# Patient Record
Sex: Female | Born: 1960 | Race: Black or African American | Hispanic: No | Marital: Married | Smoking: Former smoker
Health system: Southern US, Community
[De-identification: ages and names within clinical notes are randomized; demographics above are authoritative.]

## PROBLEM LIST (undated history)

## (undated) DIAGNOSIS — M1990 Unspecified osteoarthritis, unspecified site: Secondary | ICD-10-CM

## (undated) DIAGNOSIS — F419 Anxiety disorder, unspecified: Secondary | ICD-10-CM

## (undated) DIAGNOSIS — Z860100 Personal history of colon polyps, unspecified: Secondary | ICD-10-CM

## (undated) DIAGNOSIS — K5792 Diverticulitis of intestine, part unspecified, without perforation or abscess without bleeding: Secondary | ICD-10-CM

## (undated) DIAGNOSIS — F329 Major depressive disorder, single episode, unspecified: Secondary | ICD-10-CM

## (undated) DIAGNOSIS — F32A Depression, unspecified: Secondary | ICD-10-CM

## (undated) DIAGNOSIS — D689 Coagulation defect, unspecified: Secondary | ICD-10-CM

## (undated) DIAGNOSIS — E119 Type 2 diabetes mellitus without complications: Secondary | ICD-10-CM

## (undated) DIAGNOSIS — H269 Unspecified cataract: Secondary | ICD-10-CM

## (undated) DIAGNOSIS — K219 Gastro-esophageal reflux disease without esophagitis: Secondary | ICD-10-CM

## (undated) DIAGNOSIS — IMO0002 Reserved for concepts with insufficient information to code with codable children: Secondary | ICD-10-CM

## (undated) DIAGNOSIS — I10 Essential (primary) hypertension: Secondary | ICD-10-CM

## (undated) DIAGNOSIS — T7840XA Allergy, unspecified, initial encounter: Secondary | ICD-10-CM

## (undated) DIAGNOSIS — E785 Hyperlipidemia, unspecified: Secondary | ICD-10-CM

## (undated) DIAGNOSIS — B019 Varicella without complication: Secondary | ICD-10-CM

## (undated) HISTORY — DX: Unspecified osteoarthritis, unspecified site: M19.90

## (undated) HISTORY — DX: Diverticulitis of intestine, part unspecified, without perforation or abscess without bleeding: K57.92

## (undated) HISTORY — DX: Reserved for concepts with insufficient information to code with codable children: IMO0002

## (undated) HISTORY — PX: JOINT REPLACEMENT: SHX530

## (undated) HISTORY — DX: Coagulation defect, unspecified: D68.9

## (undated) HISTORY — DX: Personal history of colon polyps, unspecified: Z86.0100

## (undated) HISTORY — DX: Major depressive disorder, single episode, unspecified: F32.9

## (undated) HISTORY — PX: TONSILLECTOMY: SUR1361

## (undated) HISTORY — PX: BREAST EXCISIONAL BIOPSY: SUR124

## (undated) HISTORY — DX: Essential (primary) hypertension: I10

## (undated) HISTORY — DX: Hyperlipidemia, unspecified: E78.5

## (undated) HISTORY — DX: Anxiety disorder, unspecified: F41.9

## (undated) HISTORY — DX: Gastro-esophageal reflux disease without esophagitis: K21.9

## (undated) HISTORY — DX: Personal history of colonic polyps: Z86.010

## (undated) HISTORY — DX: Depression, unspecified: F32.A

## (undated) HISTORY — DX: Varicella without complication: B01.9

## (undated) HISTORY — DX: Type 2 diabetes mellitus without complications: E11.9

## (undated) HISTORY — PX: CHOLECYSTECTOMY: SHX55

## (undated) HISTORY — DX: Unspecified cataract: H26.9

## (undated) HISTORY — PX: BREAST SURGERY: SHX581

## (undated) HISTORY — PX: TOTAL HIP ARTHROPLASTY: SHX124

## (undated) HISTORY — DX: Allergy, unspecified, initial encounter: T78.40XA

---

## 1988-05-09 HISTORY — PX: BREAST BIOPSY: SHX20

## 1988-05-09 HISTORY — PX: BREAST SURGERY: SHX581

## 2002-12-25 ENCOUNTER — Observation Stay (HOSPITAL_COMMUNITY): Admission: RE | Admit: 2002-12-25 | Discharge: 2002-12-26 | Payer: Self-pay | Admitting: *Deleted

## 2006-02-22 ENCOUNTER — Ambulatory Visit: Payer: Self-pay | Admitting: Family Medicine

## 2007-05-10 HISTORY — PX: TOTAL ABDOMINAL HYSTERECTOMY: SHX209

## 2007-05-10 HISTORY — PX: ABDOMINAL HYSTERECTOMY: SHX81

## 2007-11-28 ENCOUNTER — Ambulatory Visit: Payer: Self-pay | Admitting: Gastroenterology

## 2008-02-14 ENCOUNTER — Ambulatory Visit: Payer: Self-pay | Admitting: Family Medicine

## 2008-05-09 HISTORY — PX: NASAL SEPTUM SURGERY: SHX37

## 2008-05-09 HISTORY — PX: KNEE ARTHROSCOPY: SUR90

## 2009-04-23 ENCOUNTER — Ambulatory Visit: Payer: Self-pay | Admitting: Otolaryngology

## 2009-12-17 ENCOUNTER — Ambulatory Visit: Payer: Self-pay | Admitting: Internal Medicine

## 2009-12-18 ENCOUNTER — Ambulatory Visit: Payer: Self-pay | Admitting: Internal Medicine

## 2010-01-07 ENCOUNTER — Ambulatory Visit: Payer: Self-pay | Admitting: Surgery

## 2010-01-14 ENCOUNTER — Ambulatory Visit: Payer: Self-pay | Admitting: Surgery

## 2010-01-15 LAB — PATHOLOGY REPORT

## 2010-01-22 ENCOUNTER — Ambulatory Visit: Payer: Self-pay | Admitting: Family Medicine

## 2012-05-25 ENCOUNTER — Emergency Department: Payer: Self-pay | Admitting: Emergency Medicine

## 2012-05-25 LAB — CBC
HCT: 36.6 % (ref 35.0–47.0)
HGB: 12.4 g/dL (ref 12.0–16.0)
MCH: 30.7 pg (ref 26.0–34.0)
MCV: 90 fL (ref 80–100)
Platelet: 459 10*3/uL — ABNORMAL HIGH (ref 150–440)
RDW: 13.9 % (ref 11.5–14.5)
WBC: 10.4 10*3/uL (ref 3.6–11.0)

## 2012-05-25 LAB — COMPREHENSIVE METABOLIC PANEL
Alkaline Phosphatase: 74 U/L (ref 50–136)
BUN: 11 mg/dL (ref 7–18)
Bilirubin,Total: 0.1 mg/dL — ABNORMAL LOW (ref 0.2–1.0)
Calcium, Total: 8.6 mg/dL (ref 8.5–10.1)
Chloride: 105 mmol/L (ref 98–107)
Co2: 25 mmol/L (ref 21–32)
EGFR (African American): >60
EGFR (Non-African Amer.): >60
Glucose: 99 mg/dL (ref 65–99)
SGPT (ALT): 29 U/L (ref 12–78)
Sodium: 138 mmol/L (ref 136–145)

## 2012-05-26 LAB — TSH: Thyroid Stimulating Horm: 3.04 u[IU]/mL

## 2012-06-15 ENCOUNTER — Ambulatory Visit: Payer: Self-pay | Admitting: Internal Medicine

## 2013-01-22 ENCOUNTER — Emergency Department: Payer: Self-pay | Admitting: Emergency Medicine

## 2013-01-22 LAB — LIPASE, BLOOD: Lipase: 96 U/L (ref 73–393)

## 2013-01-22 LAB — COMPREHENSIVE METABOLIC PANEL
Albumin: 3.6 g/dL (ref 3.4–5.0)
Alkaline Phosphatase: 57 U/L (ref 50–136)
Anion Gap: 5 — ABNORMAL LOW (ref 7–16)
Bilirubin,Total: 0.2 mg/dL (ref 0.2–1.0)
Calcium, Total: 8.2 mg/dL — ABNORMAL LOW (ref 8.5–10.1)
Chloride: 104 mmol/L (ref 98–107)
Co2: 25 mmol/L (ref 21–32)
Creatinine: 0.86 mg/dL (ref 0.60–1.30)
EGFR (African American): >60
SGPT (ALT): 28 U/L (ref 12–78)
Sodium: 134 mmol/L — ABNORMAL LOW (ref 136–145)
Total Protein: 7.4 g/dL (ref 6.4–8.2)

## 2013-01-22 LAB — CBC
HCT: 35.8 % (ref 35.0–47.0)
MCH: 30.8 pg (ref 26.0–34.0)
RDW: 15 % — ABNORMAL HIGH (ref 11.5–14.5)

## 2013-01-23 LAB — URINALYSIS, COMPLETE
Bilirubin,UR: NEGATIVE
Ph: 6 (ref 4.5–8.0)
Protein: NEGATIVE
RBC,UR: <1 /HPF (ref 0–5)
Specific Gravity: 1.025 (ref 1.003–1.030)
Squamous Epithelial: 5
WBC UR: 3 /HPF (ref 0–5)

## 2013-03-19 ENCOUNTER — Ambulatory Visit: Payer: Self-pay

## 2013-03-22 ENCOUNTER — Ambulatory Visit: Payer: Self-pay | Admitting: Gastroenterology

## 2013-04-11 ENCOUNTER — Ambulatory Visit: Payer: Self-pay | Admitting: Family Medicine

## 2013-04-19 ENCOUNTER — Ambulatory Visit: Payer: Self-pay | Admitting: Gastroenterology

## 2013-06-01 ENCOUNTER — Ambulatory Visit: Payer: Self-pay | Admitting: Orthopedic Surgery

## 2013-06-09 LAB — HM DIABETES EYE EXAM

## 2013-06-11 LAB — HM DIABETES FOOT EXAM

## 2014-03-11 ENCOUNTER — Encounter (INDEPENDENT_AMBULATORY_CARE_PROVIDER_SITE_OTHER): Payer: Self-pay

## 2014-03-11 ENCOUNTER — Encounter: Payer: Self-pay | Admitting: Internal Medicine

## 2014-03-11 ENCOUNTER — Ambulatory Visit (INDEPENDENT_AMBULATORY_CARE_PROVIDER_SITE_OTHER): Payer: Commercial Managed Care - PPO | Admitting: Internal Medicine

## 2014-03-11 VITALS — BP 118/80 | HR 57 | Temp 98.2°F | Ht 65.75 in | Wt 200.0 lb

## 2014-03-11 DIAGNOSIS — E669 Obesity, unspecified: Secondary | ICD-10-CM

## 2014-03-11 DIAGNOSIS — E1169 Type 2 diabetes mellitus with other specified complication: Secondary | ICD-10-CM | POA: Insufficient documentation

## 2014-03-11 DIAGNOSIS — M1990 Unspecified osteoarthritis, unspecified site: Secondary | ICD-10-CM | POA: Insufficient documentation

## 2014-03-11 DIAGNOSIS — E785 Hyperlipidemia, unspecified: Secondary | ICD-10-CM

## 2014-03-11 DIAGNOSIS — E1159 Type 2 diabetes mellitus with other circulatory complications: Secondary | ICD-10-CM | POA: Insufficient documentation

## 2014-03-11 DIAGNOSIS — K219 Gastro-esophageal reflux disease without esophagitis: Secondary | ICD-10-CM

## 2014-03-11 DIAGNOSIS — E119 Type 2 diabetes mellitus without complications: Secondary | ICD-10-CM

## 2014-03-11 DIAGNOSIS — I152 Hypertension secondary to endocrine disorders: Secondary | ICD-10-CM | POA: Insufficient documentation

## 2014-03-11 DIAGNOSIS — I10 Essential (primary) hypertension: Secondary | ICD-10-CM

## 2014-03-11 DIAGNOSIS — Z23 Encounter for immunization: Secondary | ICD-10-CM

## 2014-03-11 NOTE — Addendum Note (Signed)
Addended by: Roena MaladyEVONTENNO, Amariss Detamore Y on: 03/11/2014 02:05 PM   Modules accepted: Orders

## 2014-03-11 NOTE — Assessment & Plan Note (Signed)
Recently had labs at prior PCP- will request those records Eye exam UTD Foot exam UTD Will check microalbumin and A1C at next visit Flu shot UTD Will discuss Pneumonia vaccine at next visit

## 2014-03-11 NOTE — Progress Notes (Signed)
HPI  Pt presents to the clinic today to establish care. She is transferring care from Dr. Thedore MinsSingh at [facility]. She is scheduled to have a tonsillectomy later this month secondary to recurrent tonsillitis.  Flu: 02/2014 Tetanus: 2005? LMP: Hysterectomy Pap Smear: 2014 Mammogram: 04/2013 Colon Screening: 2014- [facility] (polyps, otherwise normal) Vision Screenig: 01/2014 Dentist: biannually  Arthritis: Occasional. Seems to be most severe in her neck. Worse with cold weather. Relieved with occasional tylenol.  Depression: Remote history of. No reoccurrence. Not medicated.  DM 2: Sugars range from 90-110 fasting. Her last A1C was 6.4%, 10/15. Tries to comply with diabetic diet. Lost 15 lbs over the last 3 months through exercise. Has had diabetic eye exam- no retinopathy. Had foot exam 10/15 from last PCP.  GERD: On protonix daily. Has not had breakthrough issues. Recent UGI- no evidence of Barrett's.  HTN: Well controlled on Lisinopril-HCTZ.  HLD: Takes fish oil daily, otherwise diet controlled.  Past Medical History  Diagnosis Date  . Arthritis   . Chicken pox   . Depression   . Diabetes mellitus without complication   . Diverticulitis   . GERD (gastroesophageal reflux disease)   . Allergy   . Hypertension   . Hyperlipidemia   . History of colon polyps     Current Outpatient Prescriptions  Medication Sig Dispense Refill  . beclomethasone (QVAR) 80 MCG/ACT inhaler Place 1 each into the nose daily.    Marland Kitchen. estrogens, conjugated, (PREMARIN) 0.3 MG tablet Take 1 tablet by mouth daily.    Marland Kitchen. lisinopril-hydrochlorothiazide (PRINZIDE,ZESTORETIC) 20-25 MG per tablet Take 1 tablet by mouth daily.    . metFORMIN (GLUCOPHAGE) 500 MG tablet Take 1 tablet by mouth daily.    . Omega-3 Fatty Acids (FISH OIL) 300 MG CAPS Take 1 capsule by mouth 2 (two) times daily.    . pantoprazole (PROTONIX) 40 MG tablet Take 1 tablet by mouth daily.     No current facility-administered medications  for this visit.    Allergies  Allergen Reactions  . Doxycycline Diarrhea and Nausea And Vomiting  . Nsaids Other (See Comments)    Bleeding ulcer    Family History  Problem Relation Age of Onset  . Alcohol abuse Mother   . Hyperlipidemia Mother   . Heart disease Mother   . Hypertension Mother   . Alcohol abuse Father   . Hyperlipidemia Father   . Heart disease Father   . Hypertension Father   . Alcohol abuse Maternal Aunt   . Hypertension Maternal Aunt   . Kidney disease Maternal Aunt   . Alcohol abuse Maternal Uncle   . Hypertension Maternal Uncle   . Alcohol abuse Paternal Aunt   . Birth defects Paternal Aunt     Colon, Breast, Lung  . Heart disease Paternal Aunt   . Alcohol abuse Paternal Uncle   . Heart disease Paternal Uncle   . Stroke Maternal Grandmother   . Hypertension Maternal Grandmother   . Alcohol abuse Maternal Grandfather   . Heart disease Maternal Grandfather   . Hypertension Maternal Grandfather     History   Social History  . Marital Status: Married    Spouse Name: N/A    Number of Children: N/A  . Years of Education: N/A   Occupational History  . Not on file.   Social History Main Topics  . Smoking status: Former Games developermoker  . Smokeless tobacco: Never Used     Comment: quit 2007  . Alcohol Use: No  .  Drug Use: Not on file  . Sexual Activity: Not on file   Other Topics Concern  . Not on file   Social History Narrative  . No narrative on file    ROS:  Constitutional: Denies fever, malaise, fatigue, headache or abrupt weight changes.  Respiratory: Denies difficulty breathing, shortness of breath, cough or sputum production.   Cardiovascular: Denies chest pain, chest tightness, palpitations or swelling in the hands or feet.  Gastrointestinal: Denies abdominal pain, bloating, constipation, diarrhea or blood in the stool.  Musculoskeletal: Pt reports neck pain. Denies decrease in range of motion, difficulty with gait, muscle pain or joint   swelling.  Skin: Denies redness, rashes, lesions or ulcercations.  Neurological: Denies dizziness, difficulty with memory, difficulty with speech or problems with balance and coordination.   No other specific complaints in a complete review of systems (except as listed in HPI above).  PE:  BP 118/80 mmHg  Pulse 57  Temp(Src) 98.2 F (36.8 C) (Oral)  Ht 5' 5.75" (1.67 m)  Wt 200 lb (90.719 kg)  BMI 32.53 kg/m2  SpO2 98%  LMP  (LMP Unknown) Wt Readings from Last 3 Encounters:  03/11/14 200 lb (90.719 kg)    General: Appears her stated age, well developed, well nourished in NAD. Cardiovascular: Normal rate and rhythm. S1,S2 noted.  No murmur, rubs or gallops noted. No JVD or BLE edema. No carotid bruits noted. Pulmonary/Chest: Normal effort and positive vesicular breath sounds. No respiratory distress. No wheezes, rales or ronchi noted.  Abdomen: Soft and nontender. Normal bowel sounds, no bruits noted. No distention or masses noted. Liver, spleen and kidneys non palpable. Musculoskeletal: Normal flexion, extension and lateral rotations of the neck. Some tenderness noted with palpation of the cervical spine.  Neurological: Alert and oriented.  Psychiatric: Mood and affect normal. Behavior is normal. Judgment and thought content normal.     Assessment and Plan:  Health Maintenance:  Tdap today  RTC in 6 months for follow up of chronic conditions

## 2014-03-11 NOTE — Progress Notes (Signed)
Pre visit review using our clinic review tool, if applicable. No additional management support is needed unless otherwise documented below in the visit note. 

## 2014-03-11 NOTE — Assessment & Plan Note (Signed)
On fish oil but mainly diet controlled Will request recent labs from previous PCP Will repeat lipid profile at next visit

## 2014-03-11 NOTE — Assessment & Plan Note (Signed)
Has been working on weight loss with diet and exercise Congratulated her on loss on 15 lbs Encourage her to continue to work on her diet and exercise

## 2014-03-11 NOTE — Assessment & Plan Note (Signed)
Well controlled on lisinopril HCT Will request recent labs from prior PCP Will check CBC and CMET at next visit

## 2014-03-11 NOTE — Assessment & Plan Note (Signed)
Controlled on protonix 40 mg daily Will discuss cutting back on this at next visit

## 2014-03-11 NOTE — Assessment & Plan Note (Signed)
Mainly in her neck Relieved by occasional tylenol

## 2014-03-11 NOTE — Patient Instructions (Signed)
Tonsillectomy  A tonsillectomy is a surgery to remove your tonsils. Tonsils are lymph tissues at the back and upper part of your throat. Because tonsils can collect debris, they can become infected. Tonsillectomy often is done when nonsurgical treatments have been unsuccessful in resolving problems with tonsils.   LET YOUR HEALTH CARE PROVIDER KNOW ABOUT:  · Any allergies you have.  · All medicines you are taking, including vitamins, herbs, eye drops, creams, and over-the-counter medicines, especially those containing aspirin or ibuprofen.  · Previous problems you or members of your family have had with the use of anesthetics.  · Any blood disorders you have.  · Previous surgeries you have had.  · Medical conditions you have.  · Recent cough or fever you have had.  RISKS AND COMPLICATIONS  Generally, tonsillectomy is a safe procedure. However, as with any procedure, problems can occur. Possible problems include:  · Bleeding.  · Infection.  · Scarring.  · Changes in your sense of taste.  · Changes in your voice.  · Changes in swallowing.  BEFORE THE PROCEDURE  · Do not take any aspirin, ibuprofen, or any medicines that may contain these agents for 2 weeks before the procedure.  · Do not eat or drink after midnight the night before the procedure.  PROCEDURE   · You will be given a medicine that makes you go to sleep (general anesthetic).  · A device will be placed inside of your mouth that presses your tongue down so that the tonsils at the back of your throat can be removed without cuts on the outside of your neck or throat.  · Your tonsils will typically be removed with a device called an electrocautery, which will cut your tonsils out and then shrink the surrounding blood vessels at the same time so that you will not bleed after the procedure.  · Usually, stitches will not be used to close the cut. A white scab (eschar) will form in the area where your tonsils used to be.  AFTER THE PROCEDURE  After surgery, you  will be taken to a recovery area for close monitoring. Once you are awake, stable, and taking fluids well, you will be allowed to go home.   Document Released: 08/07/2000 Document Revised: 04/30/2013 Document Reviewed: 11/20/2012  ExitCare® Patient Information ©2015 [employer]. This information is not intended to replace advice given to you by your health care provider. Make sure you discuss any questions you have with your health care provider.

## 2014-03-12 ENCOUNTER — Telehealth: Payer: Self-pay | Admitting: Internal Medicine

## 2014-03-12 NOTE — Telephone Encounter (Signed)
emmi emailed °

## 2014-04-02 ENCOUNTER — Telehealth: Payer: Self-pay

## 2014-04-02 NOTE — Telephone Encounter (Signed)
Pt left v/m requesting different code than obesity attached to 03/11/14 because pts ins will not cover obesity. Pt request cb.

## 2014-04-08 NOTE — Telephone Encounter (Signed)
Pt called in ref to DOS 03/11/2014 b/c she has [facility] stating the claim was denied. She called [employer] and they told her it's b/c obesity was listed as the primary code.  I also noticed the [facility] w[facility]a NP as the billing provider but it should have had an MD Ruthe Mannan(Talia Aron). I spoke w/Nicole at billing (direct # [phone]) and she says they can't do anything w/the claim until the denial from [facility] is rec'd. She is going to "flag" the account so that once the denial is rec'd, the claim will be sent to coding review. I notified patient.

## 2014-05-22 ENCOUNTER — Telehealth: Payer: Self-pay

## 2014-05-22 NOTE — Telephone Encounter (Signed)
Pt left v/m; pt having diarrhea with Glucophage and pt wants to know if could stop Glucophage and start Victoza. [facility]. Pt request cb. Pt last seen 03/11/14 to establish care.

## 2014-05-22 NOTE — Telephone Encounter (Signed)
We can stop glucophage and start glipizide. Victoza is not used alone in the treatment of diabetes.

## 2014-05-23 ENCOUNTER — Other Ambulatory Visit: Payer: Self-pay | Admitting: Internal Medicine

## 2014-05-23 MED ORDER — GLIPIZIDE 5 MG PO TABS: 5.0000 mg | ORAL_TABLET | Freq: Two times a day (BID) | ORAL | Status: DC

## 2014-05-23 NOTE — Telephone Encounter (Signed)
RX sent to pharmacy  

## 2014-05-23 NOTE — Telephone Encounter (Signed)
Left detailed msg on VM per HIPAA Please send glipizide to pharmacy

## 2014-06-05 ENCOUNTER — Encounter: Payer: Self-pay | Admitting: Internal Medicine

## 2014-06-05 ENCOUNTER — Ambulatory Visit (INDEPENDENT_AMBULATORY_CARE_PROVIDER_SITE_OTHER): Payer: Commercial Managed Care - PPO | Admitting: Internal Medicine

## 2014-06-05 VITALS — BP 118/70 | HR 62 | Temp 97.9°F | Wt 205.0 lb

## 2014-06-05 DIAGNOSIS — J3489 Other specified disorders of nose and nasal sinuses: Secondary | ICD-10-CM

## 2014-06-05 DIAGNOSIS — R040 Epistaxis: Secondary | ICD-10-CM

## 2014-06-05 DIAGNOSIS — R220 Localized swelling, mass and lump, head: Secondary | ICD-10-CM

## 2014-06-05 MED ORDER — METHYLPREDNISOLONE ACETATE 80 MG/ML IJ SUSP
80.0000 mg | Freq: Once | INTRAMUSCULAR | Status: AC
  Administered 2014-06-05: 80 mg via INTRAMUSCULAR

## 2014-06-05 NOTE — Progress Notes (Signed)
HPI  Hannah Calhoun is a 54 y.o. female presenting to clinic with swollen eyes x 2 days and bloody nose with blood-tinged nasal mucous x 3 weeks. Her left eye was swollen yesterday and she awoke w/ both swollen this morning. Eyelids sting and feel itchy but no pain. Has been using new body wash on face. No throat tightness, SOB, visual disturbances, eye drainage, eye irritation or pain. No dizziness, headache, or changes in mental status. Takes Allegra every day (started a month ago) but hasn't helped much; weekly allergy injections - [facility] and allergy - last one on Tuesday. Intermittently seeing clots of blood while blowing nose - thinks may be due to frequent QNASL use for chronic PND. Tried neosporin w/ no improvement. Last nose bleed was yesterday. No accident/facial injury. No prior hx of nose bleeds.   PSH Tonsillectomy in Nov.   Review of Systems  Past Medical History  Diagnosis Date  . Arthritis   . Chicken pox   . Depression   . Diabetes mellitus without complication   . Diverticulitis   . GERD (gastroesophageal reflux disease)   . Allergy   . Hypertension   . Hyperlipidemia   . History of colon polyps     Family History  Problem Relation Age of Onset  . Alcohol abuse Mother   . Hyperlipidemia Mother   . Heart disease Mother   . Hypertension Mother   . Alcohol abuse Father   . Hyperlipidemia Father   . Heart disease Father   . Hypertension Father   . Alcohol abuse Maternal Aunt   . Hypertension Maternal Aunt   . Kidney disease Maternal Aunt   . Alcohol abuse Maternal Uncle   . Hypertension Maternal Uncle   . Alcohol abuse Paternal Aunt   . Birth defects Paternal Aunt     Colon, Breast, Lung  . Heart disease Paternal Aunt   . Alcohol abuse Paternal Uncle   . Heart disease Paternal Uncle   . Stroke Maternal Grandmother   . Hypertension Maternal Grandmother   . Alcohol abuse Maternal Grandfather   . Heart disease Maternal Grandfather   . Hypertension Maternal  Grandfather     History   Social History  . Marital Status: Married    Spouse Name: N/A    Number of Children: N/A  . Years of Education: N/A   Occupational History  . Not on file.   Social History Main Topics  . Smoking status: Former Games developermoker  . Smokeless tobacco: Never Used     Comment: quit 2007  . Alcohol Use: No  . Drug Use: No  . Sexual Activity: Yes   Other Topics Concern  . Not on file   Social History Narrative    Allergies  Allergen Reactions  . Doxycycline Diarrhea and Nausea And Vomiting  . Nsaids Other (See Comments)    Bleeding ulcer    Constitutional: Denies throat tightness,headache, dizziness, fatigue, fever, and abrupt weight changes.  Skin: Pt reports swelling of the eyelids. Denies ulcerations or lesions. HEENT:  Positive nose bleeds, runny nose, nasal congestion, PND and ear fullness. Denies eye redness, eye pain, pressure behind the eyes, facial pain, ear pain, ringing in the ears or wax buildup. Respiratory: Denies cough, difficulty breathing or shortness of breath.  Cardiovascular: Denies chest pain, chest tightness, palpitations or swelling in the hands or feet.   No other specific complaints in a complete review of systems (except as listed in HPI above).  Objective:   BP  118/70 mmHg  Pulse 62  Temp(Src) 97.9 F (36.6 C) (Oral)  Wt 205 lb (92.987 kg)  SpO2 98%  LMP  (LMP Unknown) Wt Readings from Last 3 Encounters:  06/05/14 205 lb (92.987 kg)  03/11/14 200 lb (90.719 kg)   General: Appears with considerable upper and lower eyelid swelling but in NAD. Skin: Erythematous & swollen upper & lower lids b/l; no rashes. HEENT: Head: normal shape and size; Eyes: sclera white, no icterus, conjunctiva pink; Ears: Tm's gray and intact, normal light reflex; Nose: mucosa erythematous and moist, septum midline; Throat/Mouth: + PND. Teeth present, mucosa erythematous and moist, no exudate noted, no lesions or ulcerations noted.  Neck: No  adenopathy noted. Cardiovascular: Normal rate and rhythm. S1,S2 noted.  No murmur, rubs or gallops noted.  Pulmonary/Chest: Normal effort and positive vesicular breath sounds. No respiratory distress. No wheezes, rales or ronchi noted. Neuro: Alert & oriented x 3. CN 2-12 intact. Sensory and motor intact b/l upper and lower.     Assessment & Plan:   Swelling around eyes:  Depo-medrol injection 80 mg Stop new body wash use.  Try facial wash for sensitive skin.  Nose bleeds due to dry nasal passage:  Stop QNA SL use until nose bleeds resolve - up to a week. Use OTC nasal saline Continue Allegra  RTC as needed or if symptoms persist.

## 2014-06-05 NOTE — Patient Instructions (Signed)
Angioedema °Angioedema is a sudden swelling of tissues, often of the skin. It can occur on the face or genitals or in the abdomen or other body parts. The swelling usually develops over a short period and gets better in 24 to 48 hours. It often begins during the night and is found when the person wakes up. The person may also get red, itchy patches of skin (hives). Angioedema can be dangerous if it involves swelling of the air passages.  °Depending on the cause, episodes of angioedema may only happen once, come back in unpredictable patterns, or repeat for several years and then gradually fade away.  °CAUSES  °Angioedema can be caused by an allergic reaction to various triggers. It can also result from nonallergic causes, including reactions to drugs, immune system disorders, viral infections, or an abnormal gene that is passed to you from your parents (hereditary). For some people with angioedema, the cause is unknown.  °Some things that can trigger angioedema include:  °· Foods.   °· Medicines, such as ACE inhibitors, ARBs, nonsteroidal anti-inflammatory agents, or estrogen.   °· Latex.   °· Animal saliva.   °· Insect stings.   °· Dyes used in X-rays.   °· Mild injury.   °· Dental work. °· Surgery. °· Stress.   °· Sudden changes in temperature.   °· Exercise. °SIGNS AND SYMPTOMS  °· Swelling of the skin. °· Hives. If these are present, there is also intense itching. °· Redness in the affected area.   °· Pain in the affected area. °· Swollen lips or tongue. °· Breathing problems. This may happen if the air passages swell. °· Wheezing. °If internal organs are involved, there may be:  °· Nausea.   °· Abdominal pain.   °· Vomiting.   °· Difficulty swallowing.   °· Difficulty passing urine. °DIAGNOSIS  °· Your health care provider will examine the affected area and take a medical and family history. °· Various tests may be done to help determine the cause. Tests may include: °¨ Allergy skin tests to see if the problem  is an allergic reaction.   °¨ Blood tests to check for hereditary angioedema.   °¨ Tests to check for underlying diseases that could cause the condition.   °· A review of your medicines, including over-the-counter medicines, may be done. °TREATMENT  °Treatment will depend on the cause of the angioedema. Possible treatments include:  °· Removal of anything that triggered the condition (such as stopping certain medicines).   °· Medicines to treat symptoms or prevent attacks. Medicines given may include:   °¨ Antihistamines.   °¨ Epinephrine injection.   °¨ Steroids.   °· Hospitalization may be required for severe attacks. If the air passages are affected, it can be an emergency. Tubes may need to be placed to keep the airway open. °HOME CARE INSTRUCTIONS  °· Take all medicines as directed by your health care provider. °· If you were given medicines for emergency allergy treatment, always carry them with you. °· Wear a medical bracelet as directed by your health care provider.   °· Avoid known triggers. °SEEK MEDICAL CARE IF:  °· You have repeat attacks of angioedema.   °· Your attacks are more frequent or more severe despite preventive measures.   °· You have hereditary angioedema and are considering having children. It is important to discuss with your health care provider the risks of passing the condition on to your children. °SEEK IMMEDIATE MEDICAL CARE IF:  °· You have severe swelling of the mouth, tongue, or lips. °· You have difficulty breathing.   °· You have difficulty swallowing.   °· You faint. °MAKE   SURE YOU: °· Understand these instructions. °· Will watch your condition. °· Will get help right away if you are not doing well or get worse. °Document Released: 07/04/2001 Document Revised: 09/09/2013 Document Reviewed: 12/17/2012 °ExitCare® Patient Information ©2015 [employer]. This information is not intended to replace advice given to you by your health care provider. Make sure you discuss any questions  you have with your health care provider. ° °

## 2014-06-09 ENCOUNTER — Telehealth: Payer: Self-pay | Admitting: *Deleted

## 2014-06-09 NOTE — Telephone Encounter (Signed)
Patient left a voicemail stating that she was seen because of allergy problems with her eyes. Patient stated that are eyes are real irritated and wanted to know if you will prescribe a prednisone eye drop for her. [facility]

## 2014-06-10 ENCOUNTER — Other Ambulatory Visit: Payer: Self-pay | Admitting: Internal Medicine

## 2014-06-10 MED ORDER — PREDNISONE 10 MG PO TABS: ORAL_TABLET | ORAL | Status: DC

## 2014-06-10 NOTE — Telephone Encounter (Signed)
Hannah Calhoun,  Call pt, I can give her an allergy eye drop called pataday, does not have steroid in it, or we can try a 6 day low dose pred taper.  Let me know what she prefers.

## 2014-06-10 NOTE — Telephone Encounter (Signed)
Pt states she would like to try the pred pack as the drops are too expensive--send to [facility]

## 2014-06-10 NOTE — Telephone Encounter (Signed)
pred taper sent to [facility]

## 2014-07-10 ENCOUNTER — Telehealth: Payer: Self-pay

## 2014-07-10 NOTE — Telephone Encounter (Signed)
Harrold DonathNathan with [facility] left v/m requesting diabetic test strips order for testing three times a day faxed to [phone].Please advise.

## 2014-07-10 NOTE — Telephone Encounter (Signed)
Mel, OK for her to test TID, Can you please order strips

## 2014-07-11 MED ORDER — GLUCOSE BLOOD VI STRP: 1.0000 | ORAL_STRIP | Freq: Three times a day (TID) | Status: DC

## 2014-07-11 NOTE — Telephone Encounter (Signed)
Rx called in to pharmacy. 

## 2014-08-25 ENCOUNTER — Other Ambulatory Visit: Payer: Self-pay | Admitting: Internal Medicine

## 2014-08-28 ENCOUNTER — Other Ambulatory Visit: Payer: Self-pay | Admitting: Internal Medicine

## 2014-08-29 ENCOUNTER — Telehealth: Payer: Self-pay

## 2014-08-29 NOTE — Telephone Encounter (Signed)
Pt left v/m; pts husband has been dx with serious medical conditon and pt is very anxious and request [facility]; pt said would come in for appt if needed. Unable to reach pt at any contact # to schedule appt. Please advise.

## 2014-08-29 NOTE — Telephone Encounter (Signed)
Pt called back and scheduled appt with Nicki Reaperegina Baity NP on 09/01/14 at 10:15; pt could not come this afternoon due to [place]. No Si/HI. If pt condition worsens prior to appt pt will go to [facility]>

## 2014-08-29 NOTE — Telephone Encounter (Signed)
Noted, thank you

## 2014-09-01 ENCOUNTER — Encounter: Payer: Self-pay | Admitting: Internal Medicine

## 2014-09-01 ENCOUNTER — Ambulatory Visit (INDEPENDENT_AMBULATORY_CARE_PROVIDER_SITE_OTHER): Payer: Commercial Managed Care - PPO | Admitting: Internal Medicine

## 2014-09-01 VITALS — BP 116/78 | HR 63 | Temp 98.2°F | Wt 207.0 lb

## 2014-09-01 DIAGNOSIS — R1011 Right upper quadrant pain: Secondary | ICD-10-CM | POA: Diagnosis not present

## 2014-09-01 DIAGNOSIS — F419 Anxiety disorder, unspecified: Secondary | ICD-10-CM | POA: Insufficient documentation

## 2014-09-01 DIAGNOSIS — F32A Depression, unspecified: Secondary | ICD-10-CM | POA: Insufficient documentation

## 2014-09-01 DIAGNOSIS — F329 Major depressive disorder, single episode, unspecified: Secondary | ICD-10-CM | POA: Insufficient documentation

## 2014-09-01 DIAGNOSIS — F411 Generalized anxiety disorder: Secondary | ICD-10-CM

## 2014-09-01 LAB — COMPREHENSIVE METABOLIC PANEL
ALT: 15 U/L (ref 0–35)
AST: 18 U/L (ref 0–37)
Albumin: 4 g/dL (ref 3.5–5.2)
Alkaline Phosphatase: 41 U/L (ref 39–117)
BILIRUBIN TOTAL: 0.3 mg/dL (ref 0.2–1.2)
BUN: 14 mg/dL (ref 6–23)
CHLORIDE: 103 meq/L (ref 96–112)
CO2: 30 mEq/L (ref 19–32)
Calcium: 9.1 mg/dL (ref 8.4–10.5)
Creatinine, Ser: 0.87 mg/dL (ref 0.40–1.20)
GFR: 88.14 mL/min (ref >60.00)
Glucose, Bld: 99 mg/dL (ref 70–99)
Potassium: 3.5 mEq/L (ref 3.5–5.1)
Sodium: 137 mEq/L (ref 135–145)
Total Protein: 7.2 g/dL (ref 6.0–8.3)

## 2014-09-01 LAB — CBC
HEMATOCRIT: 39.3 % (ref 36.0–46.0)
Hemoglobin: 13.2 g/dL (ref 12.0–15.0)
MCHC: 33.6 g/dL (ref 30.0–36.0)
MCV: 92.8 fl (ref 78.0–100.0)
PLATELETS: 369 10*3/uL (ref 150.0–400.0)
RBC: 4.24 Mil/uL (ref 3.87–5.11)
RDW: 13.4 % (ref 11.5–15.5)
WBC: 7.2 10*3/uL (ref 4.0–10.5)

## 2014-09-01 MED ORDER — CITALOPRAM HYDROBROMIDE 20 MG PO TABS: 20.0000 mg | ORAL_TABLET | Freq: Every day | ORAL | Status: DC

## 2014-09-01 NOTE — Assessment & Plan Note (Signed)
Situational secondary to declining health of her husband Support offered today Discussed treatment with SSRI Will restart Celexa  She will update me in 4 weeks and let me know how she is doing

## 2014-09-01 NOTE — Progress Notes (Signed)
Subjective:    Patient ID: Hannah Calhoun, female    DOB: [birth year], 54 y.o.   MRN: [id number]  HPI  Pt presents to the clinic today to discuss anxiety. She reports her husband has been diagnosed with an illness. Dealing with this is very stressful. She feels overwhelmed, fatigued and restless. She reports she is having episodes where she feels like she is having panic attacks. She is having difficulty falling and staying asleep. She would like some type of medication to help her sleep. She does have a good support group of family and friends. She has been treated for anxiety in the past with Prozac, Paxil, Remeron, and Celexa. She reports they all worked well for her. She denies depression, SI/HI.  She also c/o pain in her right abdomen. This started 2 months ago. She describes it as a tightening or pulling sensation. The pain seems worse with movement. She denies nausea or vomiting. She had her gallbladder out 3 years ago but reports this pain feels different. She is having normal bowel movements. She denies shortness of breath, chest pain or chest tightness. She denies any injury to the area. She has not tried anything OTC.  Review of Systems      Past Medical History  Diagnosis Date  . Arthritis   . Chicken pox   . Depression   . Diabetes mellitus without complication   . Diverticulitis   . GERD (gastroesophageal reflux disease)   . Allergy   . Hypertension   . Hyperlipidemia   . History of colon polyps     Current Outpatient Prescriptions  Medication Sig Dispense Refill  . beclomethasone (QVAR) 80 MCG/ACT inhaler Place 1 each into the nose daily.    . Blood Glucose Monitoring Suppl (ONE TOUCH ULTRA MINI) W/DEVICE KIT     . estrogens, conjugated, (PREMARIN) 0.3 MG tablet Take by mouth.    Marland Kitchen glipiZIDE (GLUCOTROL) 5 MG tablet TAKE 1 TABLET (5 MG TOTAL) BY MOUTH 2 (TWO) TIMES DAILY BEFORE A MEAL. 60 tablet 1  . glucose blood test strip 1 each by Other route 3 (three)  times daily. 100 each 11  . lisinopril-hydrochlorothiazide (PRINZIDE,ZESTORETIC) 20-25 MG per tablet Take 1 tablet by mouth daily.    . Omega-3 Fatty Acids (FISH OIL) 300 MG CAPS Take 1 capsule by mouth 2 (two) times daily.    Glory Rosebush DELICA LANCETS 47S MISC     . pantoprazole (PROTONIX) 40 MG tablet Take 1 tablet by mouth daily.    . predniSONE (DELTASONE) 10 MG tablet Take 3 tabs on days 1-2, take 2 tabs on days 3-4, take 1 tab on days 5-6 12 tablet 0  . QNASL 80 MCG/ACT AERS   11   No current facility-administered medications for this visit.    Allergies  Allergen Reactions  . Doxycycline Diarrhea and Nausea And Vomiting  . Nsaids Other (See Comments)    Bleeding ulcer    Family History  Problem Relation Age of Onset  . Alcohol abuse Mother   . Hyperlipidemia Mother   . Heart disease Mother   . Hypertension Mother   . Alcohol abuse Father   . Hyperlipidemia Father   . Heart disease Father   . Hypertension Father   . Alcohol abuse Maternal Aunt   . Hypertension Maternal Aunt   . Kidney disease Maternal Aunt   . Alcohol abuse Maternal Uncle   . Hypertension Maternal Uncle   . Alcohol abuse Paternal Aunt   .  Birth defects Paternal Aunt     Colon, Breast, Lung  . Heart disease Paternal Aunt   . Alcohol abuse Paternal Uncle   . Heart disease Paternal Uncle   . Stroke Maternal Grandmother   . Hypertension Maternal Grandmother   . Alcohol abuse Maternal Grandfather   . Heart disease Maternal Grandfather   . Hypertension Maternal Grandfather     History   Social History  . Marital Status: Married    Spouse Name: N/A  . Number of Children: N/A  . Years of Education: N/A   Occupational History  . Not on file.   Social History Main Topics  . Smoking status: Former Research scientist (life sciences)  . Smokeless tobacco: Never Used     Comment: quit 2007  . Alcohol Use: No  . Drug Use: No  . Sexual Activity: Yes   Other Topics Concern  . Not on file   Social History Narrative      Constitutional: Denies fever, malaise, fatigue, headache or abrupt weight changes.  Respiratory: Denies difficulty breathing, shortness of breath, cough or sputum production.   Cardiovascular: Denies chest pain, chest tightness, palpitations or swelling in the hands or feet.  Gastrointestinal: Pt reports abdominal pain. Denies bloating, constipation, diarrhea or blood in the stool.  GU: Denies urgency, frequency, pain with urination, burning sensation, blood in urine, odor or discharge. Psych: Pt reports anxiety. Denies depression, SI/HI.  No other specific complaints in a complete review of systems (except as listed in HPI above).  Objective:   Physical Exam   BP 116/78 mmHg  Pulse 63  Temp(Src) 98.2 F (36.8 C) (Oral)  Wt 207 lb (93.895 kg)  SpO2 98%  LMP  (LMP Unknown) Wt Readings from Last 3 Encounters:  09/01/14 207 lb (93.895 kg)  06/05/14 205 lb (92.987 kg)  03/11/14 200 lb (90.719 kg)    General: Appears her stated age, obese in NAD. Cardiovascular: Normal rate and rhythm. S1,S2 noted.  No murmur, rubs or gallops noted.  Pulmonary/Chest: Normal effort and positive vesicular breath sounds. No respiratory distress. No wheezes, rales or ronchi noted.  Abdomen: Soft and mildly tender to the epigastric area. Normal bowel sounds, no bruits noted. No distention or masses noted. Liver, spleen and kidneys non palpable. Neurological: Alert and oriented.  Psychiatric: Mood anxious and tearful today. Judgement seems normal.  BMET    Component Value Date/Time   NA 134* 01/22/2013 2140   K 3.1* 01/22/2013 2140   CL 104 01/22/2013 2140   CO2 25 01/22/2013 2140   GLUCOSE 102* 01/22/2013 2140   BUN 10 01/22/2013 2140   CREATININE 0.86 01/22/2013 2140   CALCIUM 8.2* 01/22/2013 2140   GFRNONAA >60 01/22/2013 2140   GFRAA >60 01/22/2013 2140    Lipid Panel  No results found for: CHOL, TRIG, HDL, CHOLHDL, VLDL, LDLCALC  CBC    Component Value Date/Time   WBC 9.3  01/22/2013 2140   RBC 4.00 01/22/2013 2140   HGB 12.3 01/22/2013 2140   HCT 35.8 01/22/2013 2140   PLT 412 01/22/2013 2140   MCV 90 01/22/2013 2140   MCH 30.8 01/22/2013 2140   MCHC 34.4 01/22/2013 2140   RDW 15.0* 01/22/2013 2140    Hgb A1C No results found for: HGBA1C      Assessment & Plan:   RUQ pain:  ? Adhesion from previous cholecystectomy Will check CBC and CEMT today Advised her to try Tylenol for pain if severe

## 2014-09-01 NOTE — Progress Notes (Signed)
Pre visit review using our clinic review tool, if applicable. No additional management support is needed unless otherwise documented below in the visit note. 

## 2014-09-01 NOTE — Patient Instructions (Signed)
Social Anxiety Disorder Social anxiety disorder, previously called social phobia, is a mental disorder. People with social anxiety disorder frequently feel nervous, afraid, or embarrassed when around other people in social situations. They constantly worry that other people are judging or criticizing them for how they look, what they say, or how they act. They may worry that other people might reject them because of their appearance or behavior. Social anxiety disorder is more than just occasional shyness or self-consciousness. It can cause severe emotional distress. It can interfere with daily life activities. Social anxiety disorder also may lead to excessive alcohol or drug use and even suicide.  Social anxiety disorder is actually one of the most common mental disorders. It can develop at any time but usually starts in the teenage years. Women are more commonly affected than men. Social anxiety disorder is also more common in people who have family members with anxiety disorders. It also is more common in people who have physical deformities or conditions with characteristics that are obvious to others, such as stuttered speech or movement abnormalities (Parkinson disease).  SYMPTOMS  In addition to feeling anxious or fearful in social situations, people with social anxiety disorder frequently have physical symptoms. Examples include:  Red face (blushing).  Racing heart.  Sweating.  Shaky hands or voice.  Confusion.  Light-headedness.  Upset stomach and diarrhea. DIAGNOSIS  Social anxiety disorder is diagnosed through an assessment by your health care provider. Your health care provider will ask you questions about your mood, thoughts, and reactions in social situations. Your health care provider may ask you about your medical history and use of alcohol or drugs, including prescription medicines. Certain medical conditions and the use of certain substances, including caffeine, can cause  symptoms similar to social anxiety disorder. Your health care provider may refer you to a mental health specialist for further evaluation or treatment. The criteria for diagnosis of social anxiety disorder are:  Marked fear or anxiety in one or more social situations in which you may be closely watched or studied by others. Examples of such situations include:  Interacting socially (having a conversation with others, going to a party, or meeting strangers).  Being observed (eating or drinking in public or being called on in class).  Performing in front of others (giving a speech).  The social situations of concern almost always cause fear or anxiety, not just occasionally.  People with social anxiety disorder fear that they will be viewed negatively in a way that will be embarrassing, will lead to rejection, or will offend others. This fear is out of proportion to the actual threat posed by the social situation.  Often the triggering social situations are avoided, or they are endured with intense fear or anxiety. The fear, anxiety, or avoidance is persistent and lasts for 6 months or longer.  The anxiety causes difficulty functioning in at least some parts of your daily life. TREATMENT  Several types of treatment are available for social anxiety disorder. These treatments are often used in combination and include:   Talk therapy. Group talk therapy allows you to see that you are not alone with these problems. Individual talk therapy helps you address your specific anxiety issues with a caring professional. The most effective forms of talk therapy for social anxiety disorder are cognitive-behavioral therapy and exposure therapy. Cognitive-behavioral therapy helps you to identify and change negative thoughts and beliefs that are at the root of the disorder. Exposure therapy allows you to gradually face the situations   that you fear most.  Relaxation and coping techniques. These include deep  breathing, self-talk, meditation, visual imagery, and yoga. Relaxation techniques help to keep you calm in social situations.  Social skills training.Social skills can be learned on your own or with the help of a talk therapist. They can help you feel more confident and comfortable in social situations.  Medicine. For anxiety limited to performance situations (performance anxiety), medicine called beta blockers can help by reducing or preventing the physical symptoms of social anxiety disorder. For more persistent and generalized social anxiety, antidepressant medicine may be prescribed to help control symptoms. In severe cases of social anxiety disorder, strong antianxiety medicine, called benzodiazepines, may be prescribed on a limited basis and for a short time. Document Released: 03/24/2005 Document Revised: 09/09/2013 Document Reviewed: 07/24/2012 Exit[employer] Patient Information 2015 [employer]. This information is not intended to replace advice given to you by your health care provider. Make sure you discuss any questions you have with your health care provider.  

## 2014-09-09 ENCOUNTER — Telehealth: Payer: Self-pay | Admitting: Internal Medicine

## 2014-09-09 ENCOUNTER — Ambulatory Visit: Payer: Self-pay | Admitting: Internal Medicine

## 2014-09-09 DIAGNOSIS — Z0289 Encounter for other administrative examinations: Secondary | ICD-10-CM

## 2014-09-09 NOTE — Telephone Encounter (Signed)
Pt needs to make follow up appt 

## 2014-09-09 NOTE — Telephone Encounter (Signed)
Patient did not come in for their appointment todayfor 6 month follow up  Please let me know if patient needs to be contacted immediately for follow up or no follow up needed. °

## 2014-09-09 NOTE — Telephone Encounter (Signed)
Scheduled for 09/10/14

## 2014-09-10 ENCOUNTER — Encounter: Payer: Self-pay | Admitting: Internal Medicine

## 2014-09-10 ENCOUNTER — Ambulatory Visit (INDEPENDENT_AMBULATORY_CARE_PROVIDER_SITE_OTHER): Payer: Commercial Managed Care - PPO | Admitting: Internal Medicine

## 2014-09-10 VITALS — BP 128/74 | HR 67 | Temp 98.7°F | Wt 204.5 lb

## 2014-09-10 DIAGNOSIS — I10 Essential (primary) hypertension: Secondary | ICD-10-CM

## 2014-09-10 DIAGNOSIS — K219 Gastro-esophageal reflux disease without esophagitis: Secondary | ICD-10-CM

## 2014-09-10 DIAGNOSIS — E785 Hyperlipidemia, unspecified: Secondary | ICD-10-CM

## 2014-09-10 DIAGNOSIS — F32A Depression, unspecified: Secondary | ICD-10-CM

## 2014-09-10 DIAGNOSIS — F419 Anxiety disorder, unspecified: Secondary | ICD-10-CM

## 2014-09-10 DIAGNOSIS — F329 Major depressive disorder, single episode, unspecified: Secondary | ICD-10-CM

## 2014-09-10 DIAGNOSIS — M1990 Unspecified osteoarthritis, unspecified site: Secondary | ICD-10-CM

## 2014-09-10 DIAGNOSIS — E119 Type 2 diabetes mellitus without complications: Secondary | ICD-10-CM

## 2014-09-10 DIAGNOSIS — E669 Obesity, unspecified: Secondary | ICD-10-CM

## 2014-09-10 DIAGNOSIS — F418 Other specified anxiety disorders: Secondary | ICD-10-CM

## 2014-09-10 LAB — LIPID PANEL
CHOLESTEROL: 211 mg/dL — AB (ref 0–200)
HDL: 52.2 mg/dL (ref >39.00)
LDL CALC: 126 mg/dL — AB (ref 0–99)
NonHDL: 158.8
TRIGLYCERIDES: 163 mg/dL — AB (ref 0.0–149.0)
Total CHOL/HDL Ratio: 4
VLDL: 32.6 mg/dL (ref 0.0–40.0)

## 2014-09-10 LAB — HEMOGLOBIN A1C: Hgb A1c MFr Bld: 6.3 % (ref 4.6–6.5)

## 2014-09-10 NOTE — Assessment & Plan Note (Signed)
Will repeat Lipid profile today Encouraged her to continue to consume a low fat diet Recent CMET reviewed

## 2014-09-10 NOTE — Patient Instructions (Signed)
Fat and Cholesterol Control Diet Fat and cholesterol levels in your blood and organs are influenced by your diet. High levels of fat and cholesterol may lead to diseases of the heart, small and large blood vessels, gallbladder, liver, and pancreas. CONTROLLING FAT AND CHOLESTEROL WITH DIET Although exercise and lifestyle factors are important, your diet is key. That is because certain foods are known to raise cholesterol and others to lower it. The goal is to balance foods for their effect on cholesterol and more importantly, to replace saturated and trans fat with other types of fat, such as monounsaturated fat, polyunsaturated fat, and omega-3 fatty acids. On average, a person should consume no more than 15 to 17 g of saturated fat daily. Saturated and trans fats are considered "bad" fats, and they will raise LDL cholesterol. Saturated fats are primarily found in animal products such as meats, butter, and cream. However, that does not mean you need to give up all your favorite foods. Today, there are good tasting, low-fat, low-cholesterol substitutes for most of the things you like to eat. Choose low-fat or nonfat alternatives. Choose round or loin cuts of red meat. These types of cuts are lowest in fat and cholesterol. Chicken (without the skin), fish, veal, and ground turkey breast are great choices. Eliminate fatty meats, such as hot dogs and salami. Even shellfish have little or no saturated fat. Have a 3 oz (85 g) portion when you eat lean meat, poultry, or fish. Trans fats are also called "partially hydrogenated oils." They are oils that have been scientifically manipulated so that they are solid at room temperature resulting in a longer shelf life and improved taste and texture of foods in which they are added. Trans fats are found in stick margarine, some tub margarines, cookies, crackers, and baked goods.  When baking and cooking, oils are a great substitute for butter. The monounsaturated oils are  especially beneficial since it is believed they lower LDL and raise HDL. The oils you should avoid entirely are saturated tropical oils, such as coconut and palm.  Remember to eat a lot from food groups that are naturally free of saturated and trans fat, including fish, fruit, vegetables, beans, grains (barley, rice, couscous, bulgur wheat), and pasta (without cream sauces).  IDENTIFYING FOODS THAT LOWER FAT AND CHOLESTEROL  Soluble fiber may lower your cholesterol. This type of fiber is found in fruits such as apples, vegetables such as broccoli, potatoes, and carrots, legumes such as beans, peas, and lentils, and grains such as barley. Foods fortified with plant sterols (phytosterol) may also lower cholesterol. You should eat at least 2 g per day of these foods for a cholesterol lowering effect.  Read package labels to identify low-saturated fats, trans fat free, and low-fat foods at the supermarket. Select cheeses that have only 2 to 3 g saturated fat per ounce. Use a heart-healthy tub margarine that is free of trans fats or partially hydrogenated oil. When buying baked goods (cookies, crackers), avoid partially hydrogenated oils. Breads and muffins should be made from whole grains (whole-wheat or whole oat flour, instead of "flour" or "enriched flour"). Buy non-creamy canned soups with reduced salt and no added fats.  FOOD PREPARATION TECHNIQUES  Never deep-fry. If you must fry, either stir-fry, which uses very little fat, or use non-stick cooking sprays. When possible, broil, bake, or roast meats, and steam vegetables. Instead of putting butter or margarine on vegetables, use lemon and herbs, applesauce, and cinnamon (for squash and sweet potatoes). Use nonfat   yogurt, salsa, and low-fat dressings for salads.  LOW-SATURATED FAT / LOW-FAT FOOD SUBSTITUTES Meats / Saturated Fat (g)  Avoid: Steak, marbled (3 oz/85 g) / 11 g  Choose: Steak, lean (3 oz/85 g) / 4 g  Avoid: Hamburger (3 oz/85 g) / 7  g  Choose: Hamburger, lean (3 oz/85 g) / 5 g  Avoid: Ham (3 oz/85 g) / 6 g  Choose: Ham, lean cut (3 oz/85 g) / 2.4 g  Avoid: Chicken, with skin, dark meat (3 oz/85 g) / 4 g  Choose: Chicken, skin removed, dark meat (3 oz/85 g) / 2 g  Avoid: Chicken, with skin, light meat (3 oz/85 g) / 2.5 g  Choose: Chicken, skin removed, light meat (3 oz/85 g) / 1 g Dairy / Saturated Fat (g)  Avoid: Whole milk (1 cup) / 5 g  Choose: Low-fat milk, 2% (1 cup) / 3 g  Choose: Low-fat milk, 1% (1 cup) / 1.5 g  Choose: Skim milk (1 cup) / 0.3 g  Avoid: Hard cheese (1 oz/28 g) / 6 g  Choose: Skim milk cheese (1 oz/28 g) / 2 to 3 g  Avoid: Cottage cheese, 4% fat (1 cup) / 6.5 g  Choose: Low-fat cottage cheese, 1% fat (1 cup) / 1.5 g  Avoid: Ice cream (1 cup) / 9 g  Choose: Sherbet (1 cup) / 2.5 g  Choose: Nonfat frozen yogurt (1 cup) / 0.3 g  Choose: Frozen fruit bar / trace  Avoid: Whipped cream (1 tbs) / 3.5 g  Choose: Nondairy whipped topping (1 tbs) / 1 g Condiments / Saturated Fat (g)  Avoid: Mayonnaise (1 tbs) / 2 g  Choose: Low-fat mayonnaise (1 tbs) / 1 g  Avoid: Butter (1 tbs) / 7 g  Choose: Extra light margarine (1 tbs) / 1 g  Avoid: Coconut oil (1 tbs) / 11.8 g  Choose: Olive oil (1 tbs) / 1.8 g  Choose: Corn oil (1 tbs) / 1.7 g  Choose: Safflower oil (1 tbs) / 1.2 g  Choose: Sunflower oil (1 tbs) / 1.4 g  Choose: Soybean oil (1 tbs) / 2.4 g  Choose: Canola oil (1 tbs) / 1 g Document Released: 04/25/2005 Document Revised: 08/20/2012 Document Reviewed: 07/24/2013 [employer] Patient Information 2015 [employer]. This information is not intended to replace advice given to you by your health care provider. Make sure you discuss any questions you have with your health care provider.  

## 2014-09-10 NOTE — Assessment & Plan Note (Signed)
Will repeat A1C and Microalbumin today She declines flu and pneumovax Foot exam today Eye exam UTD Continue Glipizide at this time

## 2014-09-10 NOTE — Progress Notes (Signed)
HPI  Pt presents to the clinic today for 6 month follow up of chronic condtions.  Arthritis: Occasional. Seems to be most severe in her neck. Worse with cold weather. Taking Tylenol 2-3 times per week for symptoms.   Depression: Remote history of this. No reoccurrence. Not medicated. She reports recent worsening anxiety due to her husbands worsening health. She was started on Celexa 09/01/14 for her anxiety. She states she is sleeping better and feeling much better overall on the Celexa.   DM 2: She is testing her sugars twice a day. Sugars range from 87-165 fasting. She feels like her fasting sugars have increased since starting Glipizide. Her last A1C was 6.4%, 10/15. Tries to comply with diabetic diet. She takes Glipizide twice daily. Had severe diarrhea with Metformin. Has had diabetic eye exam at the beginning of this year- no retinopathy. Had foot exam 10/15 from last PCP. Some occasional numbness and tingling in feet.   GERD: On Protonix daily. Has not had breakthrough issues. Recent UGI- no evidence of Barrett's.  HTN: Well controlled on Lisinopril-HCTZ. BP today 128/74. Denies chest pain, chest tightness, SOB.   HLD: She stopped taking fishOoil daily, otherwise diet controlled. She is due for repeat labs today. Has been trying to stick to a low fat diet.   Past Medical History  Diagnosis Date  . Arthritis   . Chicken pox   . Depression   . Diabetes mellitus without complication   . Diverticulitis   . GERD (gastroesophageal reflux disease)   . Allergy   . Hypertension   . Hyperlipidemia   . History of colon polyps     Current Outpatient Prescriptions  Medication Sig Dispense Refill  . beclomethasone (QVAR) 80 MCG/ACT inhaler Place 1 each into the nose daily.    . Blood Glucose Monitoring Suppl (ONE TOUCH ULTRA MINI) W/DEVICE KIT     . citalopram (CELEXA) 20 MG tablet Take 1 tablet (20 mg total) by mouth daily. 30 tablet 3  . estrogens, conjugated, (PREMARIN) 0.3 MG tablet  Take by mouth.    Marland Kitchen glipiZIDE (GLUCOTROL) 5 MG tablet TAKE 1 TABLET (5 MG TOTAL) BY MOUTH 2 (TWO) TIMES DAILY BEFORE A MEAL. 60 tablet 1  . glucose blood test strip 1 each by Other route 3 (three) times daily. 100 each 11  . lisinopril-hydrochlorothiazide (PRINZIDE,ZESTORETIC) 20-25 MG per tablet Take 1 tablet by mouth daily.    . Omega-3 Fatty Acids (FISH OIL) 300 MG CAPS Take 1 capsule by mouth 2 (two) times daily.    Glory Rosebush DELICA LANCETS 16X MISC     . pantoprazole (PROTONIX) 40 MG tablet Take 1 tablet by mouth daily.    . predniSONE (DELTASONE) 10 MG tablet Take 3 tabs on days 1-2, take 2 tabs on days 3-4, take 1 tab on days 5-6 12 tablet 0  . QNASL 80 MCG/ACT AERS   11   No current facility-administered medications for this visit.    Allergies  Allergen Reactions  . Doxycycline Diarrhea and Nausea And Vomiting  . Nsaids Other (See Comments)    Bleeding ulcer    Family History  Problem Relation Age of Onset  . Alcohol abuse Mother   . Hyperlipidemia Mother   . Heart disease Mother   . Hypertension Mother   . Alcohol abuse Father   . Hyperlipidemia Father   . Heart disease Father   . Hypertension Father   . Alcohol abuse Maternal Aunt   . Hypertension Maternal Aunt   .  Kidney disease Maternal Aunt   . Alcohol abuse Maternal Uncle   . Hypertension Maternal Uncle   . Alcohol abuse Paternal Aunt   . Birth defects Paternal Aunt     Colon, Breast, Lung  . Heart disease Paternal Aunt   . Alcohol abuse Paternal Uncle   . Heart disease Paternal Uncle   . Stroke Maternal Grandmother   . Hypertension Maternal Grandmother   . Alcohol abuse Maternal Grandfather   . Heart disease Maternal Grandfather   . Hypertension Maternal Grandfather     History   Social History  . Marital Status: Married    Spouse Name: N/A  . Number of Children: N/A  . Years of Education: N/A   Occupational History  . Not on file.   Social History Main Topics  . Smoking status: Former  Research scientist (life sciences)  . Smokeless tobacco: Never Used     Comment: quit 2007  . Alcohol Use: No  . Drug Use: No  . Sexual Activity: Yes   Other Topics Concern  . Not on file   Social History Narrative    ROS:  Constitutional: Denies fever, malaise, fatigue, headache or abrupt weight changes.  Respiratory: Denies difficulty breathing, shortness of breath, cough or sputum production.   Cardiovascular: Denies chest pain, chest tightness, palpitations or swelling in the hands or feet.  Gastrointestinal: Denies abdominal pain, bloating, constipation, diarrhea or blood in the stool.  Musculoskeletal:  Denies decrease in range of motion, difficulty with gait, muscle pain or joint  swelling.  Skin: Denies redness, rashes, lesions or ulcercations.  Neurological: Pt reports some numbness and tingling in her feet. Denies dizziness, difficulty with memory, difficulty with speech or problems with balance and coordination.  Psych: Pt has history of anxiety and depression. SI/HI.  No other specific complaints in a complete review of systems (except as listed in HPI above).  PE:  BP 128/74 mmHg  Pulse 67  Temp(Src) 98.7 F (37.1 C) (Oral)  Wt 204 lb 8 oz (92.761 kg)  SpO2 98%  LMP  (LMP Unknown)  Wt Readings from Last 3 Encounters:  09/01/14 207 lb (93.895 kg)  06/05/14 205 lb (92.987 kg)  03/11/14 200 lb (90.719 kg)    General: Appears her stated age, well developed, well nourished in NAD. Skin: Warm, dry and intact. No rashes or lesions noted. Cardiovascular: Normal rate and rhythm. S1,S2 noted.  No murmur, rubs or gallops noted. No JVD or BLE edema. No carotid bruits noted. Pulmonary/Chest: Normal effort and positive vesicular breath sounds. No respiratory distress. No wheezes, rales or ronchi noted.  Abdomen: Soft and nontender. Normal bowel sounds, no bruits noted. No distention or masses noted. Liver, spleen and kidneys non palpable. Musculoskeletal: Normal flexion, extension and lateral  rotations of the neck. Some tenderness noted with palpation of the cervical spine.  Neurological: Alert and oriented.  Psychiatric: Mood and affect normal. Behavior is normal. Judgment and thought content normal.     Assessment and Plan:    RTC in 6 months for physical/follow up of chronic conditions

## 2014-09-10 NOTE — Assessment & Plan Note (Signed)
Encouraged her to work on diet and exercise 

## 2014-09-10 NOTE — Assessment & Plan Note (Addendum)
No issues on Protonix, discussed weaning down to 20 mg daily She wants to hold off at this time Recent CMET reviewed

## 2014-09-10 NOTE — Assessment & Plan Note (Addendum)
Well controlled on Lisinopril-HCT CMET from 2 weeks ago reviewed Encouraged her to consume a low sodium diet

## 2014-09-10 NOTE — Progress Notes (Signed)
Pre visit review using our clinic review tool, if applicable. No additional management support is needed unless otherwise documented below in the visit note. 

## 2014-09-10 NOTE — Assessment & Plan Note (Signed)
Controlled with prn Tylenol Recent CMET reviewed

## 2014-09-10 NOTE — Assessment & Plan Note (Signed)
Recently started on Celexa Support offered  today

## 2014-09-11 LAB — MICROALBUMIN / CREATININE URINE RATIO
CREATININE, U: 65.8 mg/dL
MICROALB/CREAT RATIO: 1.1 mg/g (ref 0.0–30.0)
Microalb, Ur: <0.7 mg/dL (ref 0.0–1.9)

## 2014-09-11 MED ORDER — SIMVASTATIN 10 MG PO TABS: 10.0000 mg | ORAL_TABLET | Freq: Every day | ORAL | Status: DC

## 2014-09-11 NOTE — Addendum Note (Signed)
Addended by: Roena MaladyEVONTENNO, Nayden Czajka Y on: 09/11/2014 02:45 PM   Modules accepted: Orders

## 2014-10-07 ENCOUNTER — Telehealth: Payer: Self-pay | Admitting: Internal Medicine

## 2014-10-07 ENCOUNTER — Ambulatory Visit: Payer: Self-pay | Admitting: Internal Medicine

## 2014-10-07 NOTE — Telephone Encounter (Signed)
She needs a follow up appt to discuss 

## 2014-10-07 NOTE — Telephone Encounter (Signed)
Pt called stating her discomfort in right side is getting worse she would like to have a CT Scan

## 2014-10-08 NOTE — Telephone Encounter (Signed)
Left detailed msg on VM per HIPAA  

## 2014-10-13 ENCOUNTER — Encounter: Payer: Self-pay | Admitting: Internal Medicine

## 2014-10-13 ENCOUNTER — Ambulatory Visit (INDEPENDENT_AMBULATORY_CARE_PROVIDER_SITE_OTHER): Payer: Commercial Managed Care - PPO | Admitting: Internal Medicine

## 2014-10-13 VITALS — BP 126/78 | HR 59 | Temp 98.1°F | Wt 208.5 lb

## 2014-10-13 DIAGNOSIS — R1011 Right upper quadrant pain: Secondary | ICD-10-CM

## 2014-10-13 NOTE — Progress Notes (Signed)
Subjective:    Patient ID: Hannah Calhoun, female    DOB: 1960-09-23, 54 y.o.   MRN: [id number]  HPI  Pt presents to the clinic today with c/o RUQ abdominal pain. This started many months ago. It is not getting worse, but she reports it is not getting better. She describes the pain as pressure, but reports it can be sharp at times. The pain does not radiate. The pain comes and goes. Eating does not seem to make it better or worse. She denies nausea, vomiting or diarrhea. She is having normal bowel movements and denies blood in her stool. She does have a history of GERD but reports this feels different. She takes Protonix daily. She has had a cholecystectomy in the past. She denies urinary symptoms or vaginal complaints.  Review of Systems      Past Medical History  Diagnosis Date  . Arthritis   . Chicken pox   . Depression   . Diabetes mellitus without complication   . Diverticulitis   . GERD (gastroesophageal reflux disease)   . Allergy   . Hypertension   . Hyperlipidemia   . History of colon polyps     Current Outpatient Prescriptions  Medication Sig Dispense Refill  . Blood Glucose Monitoring Suppl (ONE TOUCH ULTRA MINI) W/DEVICE KIT     . citalopram (CELEXA) 20 MG tablet Take 1 tablet (20 mg total) by mouth daily. 30 tablet 3  . estrogens, conjugated, (PREMARIN) 0.3 MG tablet Take by mouth.    Marland Kitchen glipiZIDE (GLUCOTROL) 5 MG tablet TAKE 1 TABLET (5 MG TOTAL) BY MOUTH 2 (TWO) TIMES DAILY BEFORE A MEAL. 60 tablet 1  . glucose blood test strip 1 each by Other route 3 (three) times daily. 100 each 11  . lisinopril-hydrochlorothiazide (PRINZIDE,ZESTORETIC) 20-25 MG per tablet Take 1 tablet by mouth daily.    . Omega-3 Fatty Acids (FISH OIL) 300 MG CAPS Take 1 capsule by mouth 2 (two) times daily.    Glory Rosebush DELICA LANCETS 78G MISC     . pantoprazole (PROTONIX) 40 MG tablet Take 1 tablet by mouth daily.    . QNASL 80 MCG/ACT AERS   11  . simvastatin (ZOCOR) 10 MG tablet  Take 1 tablet (10 mg total) by mouth at bedtime. 30 tablet 2   No current facility-administered medications for this visit.    Allergies  Allergen Reactions  . Doxycycline Diarrhea and Nausea And Vomiting  . Nsaids Other (See Comments)    Bleeding ulcer    Family History  Problem Relation Age of Onset  . Alcohol abuse Mother   . Hyperlipidemia Mother   . Heart disease Mother   . Hypertension Mother   . Alcohol abuse Father   . Hyperlipidemia Father   . Heart disease Father   . Hypertension Father   . Alcohol abuse Maternal Aunt   . Hypertension Maternal Aunt   . Kidney disease Maternal Aunt   . Alcohol abuse Maternal Uncle   . Hypertension Maternal Uncle   . Alcohol abuse Paternal Aunt   . Birth defects Paternal Aunt     Colon, Breast, Lung  . Heart disease Paternal Aunt   . Alcohol abuse Paternal Uncle   . Heart disease Paternal Uncle   . Stroke Maternal Grandmother   . Hypertension Maternal Grandmother   . Alcohol abuse Maternal Grandfather   . Heart disease Maternal Grandfather   . Hypertension Maternal Grandfather     History   Social History  .  Marital Status: Married    Spouse Name: N/A  . Number of Children: N/A  . Years of Education: N/A   Occupational History  . Not on file.   Social History Main Topics  . Smoking status: Former Research scientist (life sciences)  . Smokeless tobacco: Never Used     Comment: quit 2007  . Alcohol Use: No  . Drug Use: No  . Sexual Activity: Yes   Other Topics Concern  . Not on file   Social History Narrative     Constitutional: Denies fever, malaise, fatigue, headache or abrupt weight changes.  Respiratory: Denies difficulty breathing, shortness of breath, cough or sputum production.   Cardiovascular: Denies chest pain, chest tightness, palpitations or swelling in the hands or feet.  Gastrointestinal: Pt reports abdominal pain. Denies bloating, constipation, diarrhea or blood in the stool.  GU: Denies urgency, frequency, pain with  urination, burning sensation, blood in urine, odor or discharge.  No other specific complaints in a complete review of systems (except as listed in HPI above).  Objective:   Physical Exam  BP 126/78 mmHg  Pulse 59  Temp(Src) 98.1 F (36.7 C) (Oral)  Wt 208 lb 8 oz (94.575 kg)  SpO2 98%  LMP  (LMP Unknown) Wt Readings from Last 3 Encounters:  10/13/14 208 lb 8 oz (94.575 kg)  09/10/14 204 lb 8 oz (92.761 kg)  09/01/14 207 lb (93.895 kg)    General: Appears her stated age, obese in NAD. Cardiovascular: Normal rate and rhythm. S1,S2 noted.  No murmur, rubs or gallops noted. No JVD or BLE edema. No carotid bruits noted. Pulmonary/Chest: Normal effort and positive vesicular breath sounds. No respiratory distress. No wheezes, rales or ronchi noted.  Abdomen: Soft and mildly tender in the RUQ. Normal bowel sounds, no bruits noted. No distention or masses noted. Liver, spleen and kidneys non palpable.   BMET    Component Value Date/Time   NA 137 09/01/2014 1049   NA 134* 01/22/2013 2140   K 3.5 09/01/2014 1049   K 3.1* 01/22/2013 2140   CL 103 09/01/2014 1049   CL 104 01/22/2013 2140   CO2 30 09/01/2014 1049   CO2 25 01/22/2013 2140   GLUCOSE 99 09/01/2014 1049   GLUCOSE 102* 01/22/2013 2140   BUN 14 09/01/2014 1049   BUN 10 01/22/2013 2140   CREATININE 0.87 09/01/2014 1049   CREATININE 0.86 01/22/2013 2140   CALCIUM 9.1 09/01/2014 1049   CALCIUM 8.2* 01/22/2013 2140   GFRNONAA >60 01/22/2013 2140   GFRAA >60 01/22/2013 2140    Lipid Panel     Component Value Date/Time   CHOL 211* 09/10/2014 1346   TRIG 163.0* 09/10/2014 1346   HDL 52.20 09/10/2014 1346   CHOLHDL 4 09/10/2014 1346   VLDL 32.6 09/10/2014 1346   LDLCALC 126* 09/10/2014 1346    CBC    Component Value Date/Time   WBC 7.2 09/01/2014 1049   WBC 9.3 01/22/2013 2140   RBC 4.24 09/01/2014 1049   RBC 4.00 01/22/2013 2140   HGB 13.2 09/01/2014 1049   HGB 12.3 01/22/2013 2140   HCT 39.3 09/01/2014  1049   HCT 35.8 01/22/2013 2140   PLT 369.0 09/01/2014 1049   PLT 412 01/22/2013 2140   MCV 92.8 09/01/2014 1049   MCV 90 01/22/2013 2140   MCH 30.8 01/22/2013 2140   MCHC 33.6 09/01/2014 1049   MCHC 34.4 01/22/2013 2140   RDW 13.4 09/01/2014 1049   RDW 15.0* 01/22/2013 2140    Hgb A1C  Lab Results  Component Value Date   HGBA1C 6.3 09/10/2014         Assessment & Plan:   RUQ abdominal pain:  Persistent CMET from 08/2014 reviewed, she was  having pain at that time so no need to repeat labs. Will order abdominal ultrasound today If ultrasound normal, will refer to GI for further evaluation  Will follow up after your ultrasound results are back

## 2014-10-13 NOTE — Patient Instructions (Addendum)
See Bonita QuinLinda on the way out to set up your abdominal ultrasound  Abdominal Pain Many things can cause abdominal pain. Usually, abdominal pain is not caused by a disease and will improve without treatment. It can often be observed and treated at home. Your health care provider will do a physical exam and possibly order blood tests and X-rays to help determine the seriousness of your pain. However, in many cases, more time must pass before a clear cause of the pain can be found. Before that point, your health care provider may not know if you need more testing or further treatment. HOME CARE INSTRUCTIONS  Monitor your abdominal pain for any changes. The following actions may help to alleviate any discomfort you are experiencing:  Only take over-the-counter or prescription medicines as directed by your health care provider.  Do not take laxatives unless directed to do so by your health care provider.  Try a clear liquid diet (broth, tea, or water) as directed by your health care provider. Slowly move to a bland diet as tolerated. SEEK MEDICAL CARE IF:  You have unexplained abdominal pain.  You have abdominal pain associated with nausea or diarrhea.  You have pain when you urinate or have a bowel movement.  You experience abdominal pain that wakes you in the night.  You have abdominal pain that is worsened or improved by eating food.  You have abdominal pain that is worsened with eating fatty foods.  You have a fever. SEEK IMMEDIATE MEDICAL CARE IF:   Your pain does not go away within 2 hours.  You keep throwing up (vomiting).  Your pain is felt only in portions of the abdomen, such as the right side or the left lower portion of the abdomen.  You pass bloody or black tarry stools. MAKE SURE YOU:  Understand these instructions.   Will watch your condition.   Will get help right away if you are not doing well or get worse.  Document Released: 02/02/2005 Document Revised:  04/30/2013 Document Reviewed: 01/02/2013 [facility]Exit[facility] Patient Information 2015 [place] GardenExitCare, [place]. This information is not intended to replace advice given to you by your health care provider. Make sure you discuss any questions you have with your health care provider.

## 2014-10-13 NOTE — Progress Notes (Signed)
Pre visit review using our clinic review tool, if applicable. No additional management support is needed unless otherwise documented below in the visit note. 

## 2014-10-17 ENCOUNTER — Ambulatory Visit
Admission: RE | Admit: 2014-10-17 | Discharge: 2014-10-17 | Disposition: A | Discharge status: Home / Self Care | Payer: Commercial Managed Care - PPO | Source: Ambulatory Visit | Attending: Internal Medicine | Admitting: Internal Medicine

## 2014-10-17 ENCOUNTER — Other Ambulatory Visit: Payer: Self-pay | Admitting: Internal Medicine

## 2014-10-17 DIAGNOSIS — R1011 Right upper quadrant pain: Secondary | ICD-10-CM

## 2014-10-17 DIAGNOSIS — Z9049 Acquired absence of other specified parts of digestive tract: Secondary | ICD-10-CM | POA: Diagnosis not present

## 2014-11-07 ENCOUNTER — Ambulatory Visit (INDEPENDENT_AMBULATORY_CARE_PROVIDER_SITE_OTHER): Payer: Commercial Managed Care - PPO | Admitting: Internal Medicine

## 2014-11-07 ENCOUNTER — Encounter: Payer: Self-pay | Admitting: Internal Medicine

## 2014-11-07 VITALS — BP 114/70 | HR 52 | Temp 98.0°F | Wt 208.0 lb

## 2014-11-07 DIAGNOSIS — H9201 Otalgia, right ear: Secondary | ICD-10-CM | POA: Diagnosis not present

## 2014-11-07 DIAGNOSIS — J309 Allergic rhinitis, unspecified: Secondary | ICD-10-CM | POA: Diagnosis not present

## 2014-11-07 NOTE — Progress Notes (Signed)
Subjective:    Patient ID: Hannah Calhoun, female    DOB: Sep 26, 1960, 55 y.o.   MRN: [id number]  HPI  Pt presents to the clinic today with c/o right ear pain. This started 5 days ago. She descries the pain as sharp and stabbing. The pain is constant and radiates into her jaw. It seems to hurt worse with chewing. She has had some associated facial pain and pressure, and nasal congestion as well. She is blowing clear mucous out of her nose. She denies loss of hearing. She denies fever or chills but has body aches. She has tried Tylenol without any relief. She has a history of allergies. She reports she has been on allergy shots in the past, but has not had any since 06/2014. She does not take an antihistamine OTC. She denies breathing problems. She has not had sick contacts. She does not smoke.  Review of Systems      Past Medical History  Diagnosis Date  . Arthritis   . Chicken pox   . Depression   . Diabetes mellitus without complication   . Diverticulitis   . GERD (gastroesophageal reflux disease)   . Allergy   . Hypertension   . Hyperlipidemia   . History of colon polyps     Current Outpatient Prescriptions  Medication Sig Dispense Refill  . Blood Glucose Monitoring Suppl (ONE TOUCH ULTRA MINI) W/DEVICE KIT     . citalopram (CELEXA) 20 MG tablet Take 1 tablet (20 mg total) by mouth daily. 30 tablet 3  . estrogens, conjugated, (PREMARIN) 0.3 MG tablet Take by mouth.    Marland Kitchen glipiZIDE (GLUCOTROL) 5 MG tablet TAKE 1 TABLET (5 MG TOTAL) BY MOUTH 2 (TWO) TIMES DAILY BEFORE A MEAL. 60 tablet 1  . glucose blood test strip 1 each by Other route 3 (three) times daily. 100 each 11  . lisinopril-hydrochlorothiazide (PRINZIDE,ZESTORETIC) 20-25 MG per tablet Take 1 tablet by mouth daily.    . Omega-3 Fatty Acids (FISH OIL) 300 MG CAPS Take 1 capsule by mouth 2 (two) times daily.    Glory Rosebush DELICA LANCETS 44I MISC     . pantoprazole (PROTONIX) 40 MG tablet Take 1 tablet by mouth  daily.    . simvastatin (ZOCOR) 10 MG tablet Take 1 tablet (10 mg total) by mouth at bedtime. 30 tablet 2   No current facility-administered medications for this visit.    Allergies  Allergen Reactions  . Doxycycline Diarrhea and Nausea And Vomiting  . Nsaids Other (See Comments)    Bleeding ulcer    Family History  Problem Relation Age of Onset  . Alcohol abuse Mother   . Hyperlipidemia Mother   . Heart disease Mother   . Hypertension Mother   . Alcohol abuse Father   . Hyperlipidemia Father   . Heart disease Father   . Hypertension Father   . Alcohol abuse Maternal Aunt   . Hypertension Maternal Aunt   . Kidney disease Maternal Aunt   . Alcohol abuse Maternal Uncle   . Hypertension Maternal Uncle   . Alcohol abuse Paternal Aunt   . Birth defects Paternal Aunt     Colon, Breast, Lung  . Heart disease Paternal Aunt   . Alcohol abuse Paternal Uncle   . Heart disease Paternal Uncle   . Stroke Maternal Grandmother   . Hypertension Maternal Grandmother   . Alcohol abuse Maternal Grandfather   . Heart disease Maternal Grandfather   . Hypertension Maternal Grandfather  History   Social History  . Marital Status: Married    Spouse Name: N/A  . Number of Children: N/A  . Years of Education: N/A   Occupational History  . Not on file.   Social History Main Topics  . Smoking status: Former Research scientist (life sciences)  . Smokeless tobacco: Never Used     Comment: quit 2007  . Alcohol Use: No  . Drug Use: No  . Sexual Activity: Yes   Other Topics Concern  . Not on file   Social History Narrative     Constitutional: Denies fever, malaise, fatigue, headache or abrupt weight changes.  HEENT: Pt reports facial pain and pressure, ear pain and nasal congestion Denies eye pain, eye redness, ringing in the ears, wax buildup, runny nose, bloody nose, or sore throat. Respiratory: Denies difficulty breathing, shortness of breath, cough or sputum production.   Cardiovascular: Denies chest  pain, chest tightness, palpitations or swelling in the hands or feet.  Neurological: Denies dizziness, difficulty with memory, difficulty with speech or problems with balance and coordination.   No other specific complaints in a complete review of systems (except as listed in HPI above).  Objective:   Physical Exam   BP 114/70 mmHg  Pulse 52  Temp(Src) 98 F (36.7 C) (Oral)  Wt 208 lb (94.348 kg)  SpO2 98%  LMP  (LMP Unknown) Wt Readings from Last 3 Encounters:  11/07/14 208 lb (94.348 kg)  10/13/14 208 lb 8 oz (94.575 kg)  09/10/14 204 lb 8 oz (92.761 kg)    General: Appears her stated age, well developed, well nourished in NAD. Skin: Warm, dry and intact. No rashes, lesions or ulcerations noted. HEENT: Head: normal shape and size, no sinus tendernessnoted; Eyes: sclera white, no icterus, conjunctiva pink; Ears: Tm's gray and intact, normal light reflex; Nose: mucosa boggy and moist, septum midline; Throat/Mouth: Teeth present, mucosa pink and moist, no exudate, lesions or ulcerations noted.  Neck: No adenopathy noted. Cardiovascular: Normal rate and rhythm. S1,S2 noted.   Pulmonary/Chest: Normal effort and positive vesicular breath sounds. No respiratory distress. No wheezes, rales or ronchi noted.  Neurological: Alert and oriented.   BMET    Component Value Date/Time   NA 137 09/01/2014 1049   NA 134* 01/22/2013 2140   K 3.5 09/01/2014 1049   K 3.1* 01/22/2013 2140   CL 103 09/01/2014 1049   CL 104 01/22/2013 2140   CO2 30 09/01/2014 1049   CO2 25 01/22/2013 2140   GLUCOSE 99 09/01/2014 1049   GLUCOSE 102* 01/22/2013 2140   BUN 14 09/01/2014 1049   BUN 10 01/22/2013 2140   CREATININE 0.87 09/01/2014 1049   CREATININE 0.86 01/22/2013 2140   CALCIUM 9.1 09/01/2014 1049   CALCIUM 8.2* 01/22/2013 2140   GFRNONAA >60 01/22/2013 2140   GFRAA >60 01/22/2013 2140    Lipid Panel     Component Value Date/Time   CHOL 211* 09/10/2014 1346   TRIG 163.0* 09/10/2014 1346     HDL 52.20 09/10/2014 1346   CHOLHDL 4 09/10/2014 1346   VLDL 32.6 09/10/2014 1346   LDLCALC 126* 09/10/2014 1346    CBC    Component Value Date/Time   WBC 7.2 09/01/2014 1049   WBC 9.3 01/22/2013 2140   RBC 4.24 09/01/2014 1049   RBC 4.00 01/22/2013 2140   HGB 13.2 09/01/2014 1049   HGB 12.3 01/22/2013 2140   HCT 39.3 09/01/2014 1049   HCT 35.8 01/22/2013 2140   PLT 369.0 09/01/2014 1049  PLT 412 01/22/2013 2140   MCV 92.8 09/01/2014 1049   MCV 90 01/22/2013 2140   MCH 30.8 01/22/2013 2140   MCHC 33.6 09/01/2014 1049   MCHC 34.4 01/22/2013 2140   RDW 13.4 09/01/2014 1049   RDW 15.0* 01/22/2013 2140    Hgb A1C Lab Results  Component Value Date   HGBA1C 6.3 09/10/2014        Assessment & Plan:   Allergic rhinitis with right side ear pain:  No infection noted on exam Advised her to take Flonase in the morning and Zyrtec at night daily x 1-2 weeks She is requesting pain medication for her ear pain Advised her to continue Tylenol. She can not take NSAIDS/Tramadol d/t hx of gastric ulcers. She is asking for RX for Hydrocodone, advised her I will not give her a RX for this narcotic for ear pain, she is very upset about this.  Watch for fever, chills, colored nasal mucous or worsening pain, RTC as needed or if symptoms persist or worsen

## 2014-11-07 NOTE — Patient Instructions (Addendum)
Take Zyrtec at night and Flonase in the morning.  Barotitis Media Barotitis media is inflammation of your middle ear. This occurs when the auditory tube (eustachian tube) leading from the back of your nose (nasopharynx) to your eardrum is blocked. This blockage may result from a cold, environmental allergies, or an upper respiratory infection. Unresolved barotitis media may lead to damage or hearing loss (barotrauma), which may become permanent. HOME CARE INSTRUCTIONS   Use medicines as recommended by your health care provider. Over-the-counter medicines will help unblock the canal and can help during times of air travel.  Do not put anything into your ears to clean or unplug them. Eardrops will not be helpful.  Do not swim, dive, or fly until your health care provider says it is all right to do so. If these activities are necessary, chewing gum with frequent, forceful swallowing may help. It is also helpful to hold your nose and gently blow to pop your ears for equalizing pressure changes. This forces air into the eustachian tube.  Only take over-the-counter or prescription medicines for pain, discomfort, or fever as directed by your health care provider.  A decongestant may be helpful in decongesting the middle ear and make pressure equalization easier. SEEK MEDICAL CARE IF:  You experience a serious form of dizziness in which you feel as if the room is spinning and you feel nauseated (vertigo).  Your symptoms only involve one ear. SEEK IMMEDIATE MEDICAL CARE IF:   You develop a severe headache, dizziness, or severe ear pain.  You have bloody or pus-like drainage from your ears.  You develop a fever.  Your problems do not improve or become worse. MAKE SURE YOU:   Understand these instructions.  Will watch your condition.  Will get help right away if you are not doing well or get worse. Document Released: 04/22/2000 Document Revised: 02/13/2013 Document Reviewed:  11/20/2012 [facility] Patient Information 2015 [place]ExitCare, [place]LL[place]. This information is not intended to replace advice given to you by your health care provider. Make sure you discuss any questions you have with your health care provider.

## 2014-11-07 NOTE — Progress Notes (Signed)
Pre visit review using our clinic review tool, if applicable. No additional management support is needed unless otherwise documented below in the visit note. 

## 2014-11-18 ENCOUNTER — Other Ambulatory Visit: Payer: Self-pay | Admitting: Internal Medicine

## 2014-11-18 ENCOUNTER — Encounter: Payer: Self-pay | Admitting: Internal Medicine

## 2014-11-18 ENCOUNTER — Ambulatory Visit (INDEPENDENT_AMBULATORY_CARE_PROVIDER_SITE_OTHER): Payer: Commercial Managed Care - PPO | Admitting: Internal Medicine

## 2014-11-18 VITALS — BP 120/82 | HR 68 | Temp 98.4°F | Wt 207.0 lb

## 2014-11-18 DIAGNOSIS — H9201 Otalgia, right ear: Secondary | ICD-10-CM

## 2014-11-18 DIAGNOSIS — M542 Cervicalgia: Secondary | ICD-10-CM | POA: Diagnosis not present

## 2014-11-18 MED ORDER — PREDNISONE 20 MG PO TABS: ORAL_TABLET | ORAL | Status: DC

## 2014-11-18 MED ORDER — KETOROLAC TROMETHAMINE 30 MG/ML IJ SOLN
30.0000 mg | Freq: Once | INTRAMUSCULAR | Status: AC
  Administered 2014-11-18: 30 mg via INTRAMUSCULAR

## 2014-11-18 NOTE — Patient Instructions (Signed)
Otalgia  The most common reason for this in children is an infection of the middle ear. Pain from the middle ear is usually caused by a build-up of fluid and pressure behind the eardrum. Pain from an earache can be sharp, dull, or burning. The pain may be temporary or constant. The middle ear is connected to the nasal passages by a short narrow tube called the Eustachian tube. The Eustachian tube allows fluid to drain out of the middle ear, and helps keep the pressure in your ear equalized.  CAUSES   A cold or allergy can block the Eustachian tube with inflammation and the build-up of secretions. This is especially likely in small children, because their Eustachian tube is shorter and more horizontal. When the Eustachian tube closes, the normal flow of fluid from the middle ear is stopped. Fluid can accumulate and cause stuffiness, pain, hearing loss, and an ear infection if germs start growing in this area.  SYMPTOMS   The symptoms of an ear infection may include fever, ear pain, fussiness, increased crying, and irritability. Many children will have temporary and minor hearing loss during and right after an ear infection. Permanent hearing loss is rare, but the risk increases the more infections a child has. Other causes of ear pain include retained water in the outer ear canal from swimming and bathing.  Ear pain in adults is less likely to be from an ear infection. Ear pain may be referred from other locations. Referred pain may be from the joint between your jaw and the skull. It may also come from a tooth problem or problems in the neck. Other causes of ear pain include:   A foreign body in the ear.   Outer ear infection.   Sinus infections.   Impacted ear wax.   Ear injury.   Arthritis of the jaw or TMJ problems.   Middle ear infection.   Tooth infections.   Sore throat with pain to the ears.  DIAGNOSIS   Your caregiver can usually make the diagnosis by examining you. Sometimes other special studies,  including x-rays and lab work may be necessary.  TREATMENT    If antibiotics were prescribed, use them as directed and finish them even if you or your child's symptoms seem to be improved.   Sometimes PE tubes are needed in children. These are little plastic tubes which are put into the eardrum during a simple surgical procedure. They allow fluid to drain easier and allow the pressure in the middle ear to equalize. This helps relieve the ear pain caused by pressure changes.  HOME CARE INSTRUCTIONS    Only take over-the-counter or prescription medicines for pain, discomfort, or fever as directed by your caregiver. DO NOT GIVE CHILDREN ASPIRIN because of the association of Reye's Syndrome in children taking aspirin.   Use a cold pack applied to the outer ear for 15-20 minutes, 03-04 times per day or as needed may reduce pain. Do not apply ice directly to the skin. You may cause frost bite.   Over-the-counter ear drops used as directed may be effective. Your caregiver may sometimes prescribe ear drops.   Resting in an upright position may help reduce pressure in the middle ear and relieve pain.   Ear pain caused by rapidly descending from high altitudes can be relieved by swallowing or chewing gum. Allowing infants to suck on a bottle during airplane travel can help.   Do not smoke in the house or near children. If you are   unable to quit smoking, smoke outside.   Control allergies.  SEEK IMMEDIATE MEDICAL CARE IF:    You or your child are becoming sicker.   Pain or fever relief is not obtained with medicine.   You or your child's symptoms (pain, fever, or irritability) do not improve within 24 to 48 hours or as instructed.   Severe pain suddenly stops hurting. This may indicate a ruptured eardrum.   You or your children develop new problems such as severe headaches, stiff neck, difficulty swallowing, or swelling of the face or around the ear.  Document Released: 12/11/2003 Document Revised: 07/18/2011  Document Reviewed: 04/16/2008  ExitCare Patient Information 2015 [employer]. This information is not intended to replace advice given to you by your health care provider. Make sure you discuss any questions you have with your health care provider.

## 2014-11-18 NOTE — Progress Notes (Signed)
Subjective:    Patient ID: Hannah Calhoun, female    DOB: February 07, 1961, 54 y.o.   MRN: [id number]  HPI  Pt presents to the clinic today with c/o right ear pain. This has been going on 2-3 weeks. She was seen 7/1 for the same- note reviewed. She reports she started the Zyrtec and Flonase without any relief. She continues to have sharp, stabbing pains in her right ear at times. She also feels like she hears a "roaring" noise in her ear and has pain in the right side of her neck. She denies loss of hearing. She has an appt with ENT next week. She reports she can not wait until then, "something has to be done about this pain"  Review of Systems      Past Medical History  Diagnosis Date  . Arthritis   . Chicken pox   . Depression   . Diabetes mellitus without complication   . Diverticulitis   . GERD (gastroesophageal reflux disease)   . Allergy   . Hypertension   . Hyperlipidemia   . History of colon polyps     Current Outpatient Prescriptions  Medication Sig Dispense Refill  . Blood Glucose Monitoring Suppl (ONE TOUCH ULTRA MINI) W/DEVICE KIT     . cetirizine (ZYRTEC) 10 MG tablet Take 10 mg by mouth daily.    . citalopram (CELEXA) 20 MG tablet Take 1 tablet (20 mg total) by mouth daily. 30 tablet 3  . estrogens, conjugated, (PREMARIN) 0.3 MG tablet Take by mouth.    . fluticasone (FLONASE) 50 MCG/ACT nasal spray Place 2 sprays into both nostrils daily.    Marland Kitchen glipiZIDE (GLUCOTROL) 5 MG tablet TAKE 1 TABLET (5 MG TOTAL) BY MOUTH 2 (TWO) TIMES DAILY BEFORE A MEAL. 60 tablet 1  . glucose blood test strip 1 each by Other route 3 (three) times daily. 100 each 11  . lisinopril-hydrochlorothiazide (PRINZIDE,ZESTORETIC) 20-25 MG per tablet Take 1 tablet by mouth daily.    . Omega-3 Fatty Acids (FISH OIL) 300 MG CAPS Take 1 capsule by mouth 2 (two) times daily.    Glory Rosebush DELICA LANCETS 16W MISC     . pantoprazole (PROTONIX) 40 MG tablet Take 1 tablet by mouth daily.    .  simvastatin (ZOCOR) 10 MG tablet Take 1 tablet (10 mg total) by mouth at bedtime. 30 tablet 2   No current facility-administered medications for this visit.    Allergies  Allergen Reactions  . Doxycycline Diarrhea and Nausea And Vomiting  . Nsaids Other (See Comments)    Bleeding ulcer    Family History  Problem Relation Age of Onset  . Alcohol abuse Mother   . Hyperlipidemia Mother   . Heart disease Mother   . Hypertension Mother   . Alcohol abuse Father   . Hyperlipidemia Father   . Heart disease Father   . Hypertension Father   . Alcohol abuse Maternal Aunt   . Hypertension Maternal Aunt   . Kidney disease Maternal Aunt   . Alcohol abuse Maternal Uncle   . Hypertension Maternal Uncle   . Alcohol abuse Paternal Aunt   . Birth defects Paternal Aunt     Colon, Breast, Lung  . Heart disease Paternal Aunt   . Alcohol abuse Paternal Uncle   . Heart disease Paternal Uncle   . Stroke Maternal Grandmother   . Hypertension Maternal Grandmother   . Alcohol abuse Maternal Grandfather   . Heart disease Maternal Grandfather   .  Hypertension Maternal Grandfather     History   Social History  . Marital Status: Married    Spouse Name: N/A  . Number of Children: N/A  . Years of Education: N/A   Occupational History  . Not on file.   Social History Main Topics  . Smoking status: Former Research scientist (life sciences)  . Smokeless tobacco: Never Used     Comment: quit 2007  . Alcohol Use: No  . Drug Use: No  . Sexual Activity: Yes   Other Topics Concern  . Not on file   Social History Narrative     Constitutional: Denies fever, malaise, fatigue, headache or abrupt weight changes.  HEENT: Pt reports right ear pain. Denies eye pain, eye redness, ringing in the ears, wax buildup, runny nose, nasal congestion, bloody nose, or sore throat. Respiratory: Denies difficulty breathing, shortness of breath, cough or sputum production.   Cardiovascular: Denies chest pain, chest tightness, palpitations  or swelling in the hands or feet.   MSK: Pt reports right side neck pain. Denies joint pain or swelling. Neurological: Denies dizziness, difficulty with memory, difficulty with speech or problems with balance and coordination.   No other specific complaints in a complete review of systems (except as listed in HPI above).  Objective:   Physical Exam   BP 120/82 mmHg  Pulse 68  Temp(Src) 98.4 F (36.9 C) (Oral)  Wt 207 lb (93.895 kg)  SpO2 98%  LMP  (LMP Unknown) Wt Readings from Last 3 Encounters:  11/18/14 207 lb (93.895 kg)  11/07/14 208 lb (94.348 kg)  10/13/14 208 lb 8 oz (94.575 kg)    General: Appears her stated age, well developed, well nourished in NAD. HEENT: Head: normal shape and size; Eyes: sclera white, no icterus, conjunctiva pink, PERRLA and EOMs intact; Ears: Tm's gray and intact, normal light reflex; Nose: mucosa boggy and moist, septum midline; Throat/Mouth: Teeth present, mucosa pink and moist, no exudate, lesions or ulcerations noted.  Neck: No adenopathy noted. Cardiovascular: Normal rate and rhythm. S1,S2 noted.  No murmur, rubs or gallops noted.  Pulmonary/Chest: Normal effort and positive vesicular breath sounds. No respiratory distress. No wheezes, rales or ronchi noted.  MSK: Normal flexion and extension. Pain with rotation. No pain with palpation over the cervical spine. Neurological: Alert and oriented.   BMET    Component Value Date/Time   NA 137 09/01/2014 1049   NA 134* 01/22/2013 2140   K 3.5 09/01/2014 1049   K 3.1* 01/22/2013 2140   CL 103 09/01/2014 1049   CL 104 01/22/2013 2140   CO2 30 09/01/2014 1049   CO2 25 01/22/2013 2140   GLUCOSE 99 09/01/2014 1049   GLUCOSE 102* 01/22/2013 2140   BUN 14 09/01/2014 1049   BUN 10 01/22/2013 2140   CREATININE 0.87 09/01/2014 1049   CREATININE 0.86 01/22/2013 2140   CALCIUM 9.1 09/01/2014 1049   CALCIUM 8.2* 01/22/2013 2140   GFRNONAA >60 01/22/2013 2140   GFRAA >60 01/22/2013 2140    Lipid  Panel     Component Value Date/Time   CHOL 211* 09/10/2014 1346   TRIG 163.0* 09/10/2014 1346   HDL 52.20 09/10/2014 1346   CHOLHDL 4 09/10/2014 1346   VLDL 32.6 09/10/2014 1346   LDLCALC 126* 09/10/2014 1346    CBC    Component Value Date/Time   WBC 7.2 09/01/2014 1049   WBC 9.3 01/22/2013 2140   RBC 4.24 09/01/2014 1049   RBC 4.00 01/22/2013 2140   HGB 13.2 09/01/2014 1049  HGB 12.3 01/22/2013 2140   HCT 39.3 09/01/2014 1049   HCT 35.8 01/22/2013 2140   PLT 369.0 09/01/2014 1049   PLT 412 01/22/2013 2140   MCV 92.8 09/01/2014 1049   MCV 90 01/22/2013 2140   MCH 30.8 01/22/2013 2140   MCHC 33.6 09/01/2014 1049   MCHC 34.4 01/22/2013 2140   RDW 13.4 09/01/2014 1049   RDW 15.0* 01/22/2013 2140    Hgb A1C Lab Results  Component Value Date   HGBA1C 6.3 09/10/2014        Assessment & Plan:   Right ear pain:  Again, exam is normal Will try pred taper Continue Flonase and Zyrtec Follow up with ENT next week  Right side neck pain:  I think this is MSK related She thinks it is related to her ear 30 mg Toradol IM today Encouraged her to try neck exercises Ibuprofen as needed  RTC as needed or if symptoms persist or worsen

## 2014-11-18 NOTE — Addendum Note (Signed)
Addended by: Roena MaladyEVONTENNO, Yamile Roedl Y on: 11/18/2014 04:53 PM   Modules accepted: Orders

## 2014-11-18 NOTE — Progress Notes (Signed)
Pre visit review using our clinic review tool, if applicable. No additional management support is needed unless otherwise documented below in the visit note. 

## 2014-12-03 ENCOUNTER — Other Ambulatory Visit: Payer: Self-pay | Admitting: Internal Medicine

## 2014-12-05 ENCOUNTER — Encounter: Payer: Self-pay | Admitting: Internal Medicine

## 2015-01-02 LAB — HM DIABETES EYE EXAM

## 2015-01-05 ENCOUNTER — Telehealth: Payer: Self-pay | Admitting: *Deleted

## 2015-01-05 NOTE — Telephone Encounter (Signed)
If its a form that says I did her physical so that she can get a cheaper insurance rate, then no, because I have not done her physical. She will need to make an appt with me. Mel- check behind me an make sure I haven't done her physical, I did not see where I had done it.

## 2015-01-05 NOTE — Telephone Encounter (Signed)
Patient left a voicemail stating that she has a form for her insurance company that needs to be signed by you. Patient wants to know if she can leave in your folder at [facility] for you to sign and return to her? Please advise.

## 2015-01-06 NOTE — Telephone Encounter (Signed)
Pt has appt for CPE

## 2015-01-12 ENCOUNTER — Other Ambulatory Visit: Payer: Self-pay | Admitting: Internal Medicine

## 2015-01-20 ENCOUNTER — Encounter: Payer: Self-pay | Admitting: Internal Medicine

## 2015-01-20 ENCOUNTER — Ambulatory Visit (INDEPENDENT_AMBULATORY_CARE_PROVIDER_SITE_OTHER): Payer: Commercial Managed Care - PPO | Admitting: Internal Medicine

## 2015-01-20 VITALS — BP 108/60 | HR 64 | Temp 98.1°F | Ht 67.0 in | Wt 206.0 lb

## 2015-01-20 DIAGNOSIS — Z23 Encounter for immunization: Secondary | ICD-10-CM | POA: Diagnosis not present

## 2015-01-20 DIAGNOSIS — Z0000 Encounter for general adult medical examination without abnormal findings: Secondary | ICD-10-CM | POA: Diagnosis not present

## 2015-01-20 DIAGNOSIS — Z1239 Encounter for other screening for malignant neoplasm of breast: Secondary | ICD-10-CM

## 2015-01-20 NOTE — Addendum Note (Signed)
Addended by: Lorre Munroe on: 01/20/2015 10:52 AM   Modules accepted: Kipp Brood

## 2015-01-20 NOTE — Progress Notes (Signed)
Subjective:    Patient ID: Hannah Calhoun, female    DOB: [birth year], 54 y.o.   MRN: [id number]  HPI  Pt presents to the clinic today for her annual exam.  Flu: 02/2014, wants one today Tetanus: 03/2014 Prevnar: never Pneumovax: never Mammogram: 2014 at [facility] Pap Smear: 2014 Colon Screening: 2014 at [facility] (polyps, otherwise normal) Vision Screening: 12/2014 Dentist: biannually  Diet: She is trying to eat more fruits and veggies. She tries to avoid fried foods. She drinks mostly water. Exercise: She is not exercising at this time  Review of Systems      Past Medical History  Diagnosis Date  . Arthritis   . Chicken pox   . Depression   . Diabetes mellitus without complication   . Diverticulitis   . GERD (gastroesophageal reflux disease)   . Allergy   . Hypertension   . Hyperlipidemia   . History of colon polyps     Current Outpatient Prescriptions  Medication Sig Dispense Refill  . Blood Glucose Monitoring Suppl (ONE TOUCH ULTRA MINI) W/DEVICE KIT     . cetirizine (ZYRTEC) 10 MG tablet Take 10 mg by mouth daily.    . citalopram (CELEXA) 20 MG tablet TAKE 1 TABLET BY MOUTH DAILY 30 tablet 2  . estrogens, conjugated, (PREMARIN) 0.3 MG tablet Take by mouth.    . fluticasone (FLONASE) 50 MCG/ACT nasal spray Place 2 sprays into both nostrils daily.    Marland Kitchen glipiZIDE (GLUCOTROL) 5 MG tablet TAKE 1 TABLET (5 MG TOTAL) BY MOUTH 2 (TWO) TIMES DAILY BEFORE A MEAL. 60 tablet 11  . glucose blood test strip 1 each by Other route 3 (three) times daily. 100 each 11  . lisinopril-hydrochlorothiazide (PRINZIDE,ZESTORETIC) 20-25 MG per tablet Take 1 tablet by mouth daily.    . Omega-3 Fatty Acids (FISH OIL) 300 MG CAPS Take 1 capsule by mouth 2 (two) times daily.    Glory Rosebush DELICA LANCETS 54T MISC     . pantoprazole (PROTONIX) 40 MG tablet Take 1 tablet by mouth daily.    . predniSONE (DELTASONE) 20 MG tablet Take 60 mg on days 1-3, take 40 mg on days 4-6, take 20 mg  on days 7-9 18 tablet 0  . simvastatin (ZOCOR) 10 MG tablet TAKE 1 TABLET (10 MG TOTAL) BY MOUTH AT BEDTIME. 30 tablet 3   No current facility-administered medications for this visit.    Allergies  Allergen Reactions  . Doxycycline Diarrhea and Nausea And Vomiting  . Nsaids Other (See Comments)    Bleeding ulcer    Family History  Problem Relation Age of Onset  . Alcohol abuse Mother   . Hyperlipidemia Mother   . Heart disease Mother   . Hypertension Mother   . Alcohol abuse Father   . Hyperlipidemia Father   . Heart disease Father   . Hypertension Father   . Alcohol abuse Maternal Aunt   . Hypertension Maternal Aunt   . Kidney disease Maternal Aunt   . Alcohol abuse Maternal Uncle   . Hypertension Maternal Uncle   . Alcohol abuse Paternal Aunt   . Birth defects Paternal Aunt     Colon, Breast, Lung  . Heart disease Paternal Aunt   . Alcohol abuse Paternal Uncle   . Heart disease Paternal Uncle   . Stroke Maternal Grandmother   . Hypertension Maternal Grandmother   . Alcohol abuse Maternal Grandfather   . Heart disease Maternal Grandfather   . Hypertension Maternal Grandfather  Social History   Social History  . Marital Status: Married    Spouse Name: N/A  . Number of Children: N/A  . Years of Education: N/A   Occupational History  . Not on file.   Social History Main Topics  . Smoking status: Former Research scientist (life sciences)  . Smokeless tobacco: Never Used     Comment: quit 2007  . Alcohol Use: No  . Drug Use: No  . Sexual Activity: Yes   Other Topics Concern  . Not on file   Social History Narrative     Constitutional: Denies fever, malaise, fatigue, headache or abrupt weight changes.  HEENT: Denies eye pain, eye redness, ear pain, ringing in the ears, wax buildup, runny nose, nasal congestion, bloody nose, or sore throat. Respiratory: Denies difficulty breathing, shortness of breath, cough or sputum production.   Cardiovascular: Denies chest pain, chest  tightness, palpitations or swelling in the hands or feet.  Gastrointestinal: Denies abdominal pain, bloating, constipation, diarrhea or blood in the stool.  GU: Denies urgency, frequency, pain with urination, burning sensation, blood in urine, odor or discharge. Musculoskeletal: Pt reports occasional joint pains. Denies decrease in range of motion, difficulty with gait, muscle pain or joint swelling.  Skin: Denies redness, rashes, lesions or ulcercations.  Neurological: Denies dizziness, difficulty with memory, difficulty with speech or problems with balance and coordination.  Psych: Pt reports history of anxiety and depression. Denies SI/HI.  No other specific complaints in a complete review of systems (except as listed in HPI above).  Objective:   Physical Exam    BP 108/60 mmHg  Pulse 64  Temp(Src) 98.1 F (36.7 C) (Oral)  Ht '5\' 7"'  (1.702 m)  Wt 206 lb (93.441 kg)  BMI 32.26 kg/m2  LMP  (LMP Unknown) Wt Readings from Last 3 Encounters:  01/20/15 206 lb (93.441 kg)  11/18/14 207 lb (93.895 kg)  11/07/14 208 lb (94.348 kg)    General: Appears her stated age, obese in NAD. Skin: Warm, dry and intact. No rashes, lesions or ulcerations noted. HEENT: Head: normal shape and size; Eyes: sclera white, no icterus, conjunctiva pink, PERRLA and EOMs intact; Ears: Tm's gray and intact, normal light reflex;Throat/Mouth: Teeth present, mucosa pink and moist, no exudate, lesions or ulcerations noted.  Neck:  Neck supple, trachea midline. No masses, lumps or thyromegaly present.  Cardiovascular: Normal rate and rhythm. S1,S2 noted.  No murmur, rubs or gallops noted. No JVD or BLE edema. No carotid bruits noted. Pulmonary/Chest: Normal effort and positive vesicular breath sounds. No respiratory distress. No wheezes, rales or ronchi noted.  Abdomen: Soft and nontender. Normal bowel sounds, no bruits noted. No distention or masses noted. Liver, spleen and kidneys non palpable. Musculoskeletal:  Normal range of motion. Strength 5/5 BUE/BLE. No signs of joint swelling. No difficulty with gait.  Neurological: Alert and oriented. Cranial nerves II-XII grossly intact. Coordination normal.  Psychiatric: Mood and affect normal. She engages and makes good eye contact.    BMET    Component Value Date/Time   NA 137 09/01/2014 1049   NA 134* 01/22/2013 2140   K 3.5 09/01/2014 1049   K 3.1* 01/22/2013 2140   CL 103 09/01/2014 1049   CL 104 01/22/2013 2140   CO2 30 09/01/2014 1049   CO2 25 01/22/2013 2140   GLUCOSE 99 09/01/2014 1049   GLUCOSE 102* 01/22/2013 2140   BUN 14 09/01/2014 1049   BUN 10 01/22/2013 2140   CREATININE 0.87 09/01/2014 1049   CREATININE 0.86 01/22/2013 2140  CALCIUM 9.1 09/01/2014 1049   CALCIUM 8.2* 01/22/2013 2140   GFRNONAA >60 01/22/2013 2140   GFRAA >60 01/22/2013 2140    Lipid Panel     Component Value Date/Time   CHOL 211* 09/10/2014 1346   TRIG 163.0* 09/10/2014 1346   HDL 52.20 09/10/2014 1346   CHOLHDL 4 09/10/2014 1346   VLDL 32.6 09/10/2014 1346   LDLCALC 126* 09/10/2014 1346    CBC    Component Value Date/Time   WBC 7.2 09/01/2014 1049   WBC 9.3 01/22/2013 2140   RBC 4.24 09/01/2014 1049   RBC 4.00 01/22/2013 2140   HGB 13.2 09/01/2014 1049   HGB 12.3 01/22/2013 2140   HCT 39.3 09/01/2014 1049   HCT 35.8 01/22/2013 2140   PLT 369.0 09/01/2014 1049   PLT 412 01/22/2013 2140   MCV 92.8 09/01/2014 1049   MCV 90 01/22/2013 2140   MCH 30.8 01/22/2013 2140   MCHC 33.6 09/01/2014 1049   MCHC 34.4 01/22/2013 2140   RDW 13.4 09/01/2014 1049   RDW 15.0* 01/22/2013 2140    Hgb A1C Lab Results  Component Value Date   HGBA1C 6.3 09/10/2014       Assessment & Plan:   Preventative Health Maintenance:  Encouraged her to work on diet and exercise Flu shot today Prevnar today She will get Pneumovax in 1 year Tetanus UTD Mammogram ordered- she will call [facility] to schedule Pelvic exam due 2019 Colonoscopy due 2019 Labs  from 09/2014 reviewed- will repeat labs 04/2015 Encouraged her to continue to see an eye doctor annually Encouraged her to continue to see a dentist at least annually  RTC in 3 months to follow up chronic conditions

## 2015-01-20 NOTE — Patient Instructions (Signed)

## 2015-01-20 NOTE — Addendum Note (Signed)
Addended byCleda Clarks B on: 01/20/2015 09:40 AM   Modules accepted: Orders, SmartSet

## 2015-01-20 NOTE — Progress Notes (Signed)
Pre visit review using our clinic review tool, if applicable. No additional management support is needed unless otherwise documented below in the visit note. 

## 2015-01-20 NOTE — Addendum Note (Signed)
Addended by: Lorre Munroe on: 01/20/2015 01:08 PM   Modules accepted: Kipp Brood

## 2015-01-22 ENCOUNTER — Ambulatory Visit
Admission: RE | Admit: 2015-01-22 | Discharge: 2015-01-22 | Disposition: A | Discharge status: Home / Self Care | Payer: Commercial Managed Care - PPO | Source: Ambulatory Visit | Attending: Internal Medicine | Admitting: Internal Medicine

## 2015-01-22 DIAGNOSIS — Z1239 Encounter for other screening for malignant neoplasm of breast: Secondary | ICD-10-CM

## 2015-01-22 DIAGNOSIS — Z1231 Encounter for screening mammogram for malignant neoplasm of breast: Secondary | ICD-10-CM | POA: Insufficient documentation

## 2015-01-26 ENCOUNTER — Telehealth: Payer: Self-pay

## 2015-01-26 NOTE — Telephone Encounter (Signed)
Pt left v/m; pt had mammogram done on 01/20/15 at [facility] and request cb with results when available.

## 2015-01-26 NOTE — Telephone Encounter (Signed)
The mammogram was normal. They did send her a result letter in the mail, she may have not received it yet.

## 2015-01-27 NOTE — Telephone Encounter (Signed)
Left detailed msg on VM per HIPAA  

## 2015-02-03 ENCOUNTER — Other Ambulatory Visit: Payer: Self-pay

## 2015-02-09 ENCOUNTER — Encounter: Payer: Self-pay | Admitting: Internal Medicine

## 2015-02-14 ENCOUNTER — Other Ambulatory Visit: Payer: Self-pay | Admitting: Internal Medicine

## 2015-02-16 ENCOUNTER — Other Ambulatory Visit: Payer: Self-pay | Admitting: *Deleted

## 2015-02-16 MED ORDER — LISINOPRIL-HYDROCHLOROTHIAZIDE 20-25 MG PO TABS: 1.0000 | ORAL_TABLET | Freq: Every day | ORAL | Status: DC

## 2015-02-19 ENCOUNTER — Telehealth: Payer: Self-pay | Admitting: *Deleted

## 2015-02-19 NOTE — Telephone Encounter (Signed)
Pt left voicemail at Triage. Pt needs proof of her flu shot faxed to her secured fax, fax # [phone]

## 2015-02-19 NOTE — Telephone Encounter (Signed)
Immunization report faxed per pt request

## 2015-03-17 ENCOUNTER — Encounter: Payer: Self-pay | Admitting: Internal Medicine

## 2015-04-09 ENCOUNTER — Other Ambulatory Visit: Payer: Self-pay | Admitting: Internal Medicine

## 2015-04-17 ENCOUNTER — Other Ambulatory Visit: Payer: Self-pay | Admitting: Internal Medicine

## 2015-04-20 ENCOUNTER — Ambulatory Visit: Payer: Self-pay | Admitting: Internal Medicine

## 2015-04-21 ENCOUNTER — Ambulatory Visit: Payer: Self-pay | Admitting: Internal Medicine

## 2015-04-27 ENCOUNTER — Ambulatory Visit: Payer: Commercial Managed Care - PPO | Admitting: Internal Medicine

## 2015-04-28 ENCOUNTER — Ambulatory Visit (INDEPENDENT_AMBULATORY_CARE_PROVIDER_SITE_OTHER): Payer: Commercial Managed Care - PPO | Admitting: Internal Medicine

## 2015-04-28 ENCOUNTER — Encounter: Payer: Self-pay | Admitting: Internal Medicine

## 2015-04-28 VITALS — BP 120/78 | HR 68 | Temp 98.2°F | Wt 202.0 lb

## 2015-04-28 DIAGNOSIS — F419 Anxiety disorder, unspecified: Secondary | ICD-10-CM

## 2015-04-28 DIAGNOSIS — F329 Major depressive disorder, single episode, unspecified: Secondary | ICD-10-CM

## 2015-04-28 DIAGNOSIS — E119 Type 2 diabetes mellitus without complications: Secondary | ICD-10-CM | POA: Diagnosis not present

## 2015-04-28 DIAGNOSIS — E785 Hyperlipidemia, unspecified: Secondary | ICD-10-CM | POA: Diagnosis not present

## 2015-04-28 DIAGNOSIS — F630 Pathological gambling: Secondary | ICD-10-CM | POA: Insufficient documentation

## 2015-04-28 DIAGNOSIS — K219 Gastro-esophageal reflux disease without esophagitis: Secondary | ICD-10-CM | POA: Diagnosis not present

## 2015-04-28 DIAGNOSIS — J302 Other seasonal allergic rhinitis: Secondary | ICD-10-CM | POA: Insufficient documentation

## 2015-04-28 DIAGNOSIS — I10 Essential (primary) hypertension: Secondary | ICD-10-CM | POA: Diagnosis not present

## 2015-04-28 DIAGNOSIS — M1990 Unspecified osteoarthritis, unspecified site: Secondary | ICD-10-CM | POA: Diagnosis not present

## 2015-04-28 DIAGNOSIS — F418 Other specified anxiety disorders: Secondary | ICD-10-CM

## 2015-04-28 DIAGNOSIS — F32A Depression, unspecified: Secondary | ICD-10-CM

## 2015-04-28 LAB — COMPREHENSIVE METABOLIC PANEL
ALT: 18 U/L (ref 0–35)
AST: 17 U/L (ref 0–37)
Albumin: 4.1 g/dL (ref 3.5–5.2)
Alkaline Phosphatase: 48 U/L (ref 39–117)
BILIRUBIN TOTAL: 0.3 mg/dL (ref 0.2–1.2)
BUN: 18 mg/dL (ref 6–23)
CO2: 31 meq/L (ref 19–32)
Calcium: 9.6 mg/dL (ref 8.4–10.5)
Chloride: 102 mEq/L (ref 96–112)
Creatinine, Ser: 1.03 mg/dL (ref 0.40–1.20)
GFR: 72.35 mL/min (ref >60.00)
Glucose, Bld: 107 mg/dL — ABNORMAL HIGH (ref 70–99)
Potassium: 4 mEq/L (ref 3.5–5.1)
Sodium: 140 mEq/L (ref 135–145)
TOTAL PROTEIN: 7.3 g/dL (ref 6.0–8.3)

## 2015-04-28 LAB — CBC
HEMATOCRIT: 41.2 % (ref 36.0–46.0)
Hemoglobin: 13.7 g/dL (ref 12.0–15.0)
MCHC: 33.3 g/dL (ref 30.0–36.0)
MCV: 93.3 fl (ref 78.0–100.0)
PLATELETS: 433 10*3/uL — AB (ref 150.0–400.0)
RBC: 4.42 Mil/uL (ref 3.87–5.11)
RDW: 13.5 % (ref 11.5–15.5)
WBC: 9.2 10*3/uL (ref 4.0–10.5)

## 2015-04-28 LAB — LIPID PANEL
CHOLESTEROL: 170 mg/dL (ref 0–200)
HDL: 47.6 mg/dL (ref >39.00)
LDL CALC: 103 mg/dL — AB (ref 0–99)
NonHDL: 122.85
TRIGLYCERIDES: 99 mg/dL (ref 0.0–149.0)
Total CHOL/HDL Ratio: 4
VLDL: 19.8 mg/dL (ref 0.0–40.0)

## 2015-04-28 LAB — HEMOGLOBIN A1C: HEMOGLOBIN A1C: 6.3 % (ref 4.6–6.5)

## 2015-04-28 MED ORDER — CITALOPRAM HYDROBROMIDE 40 MG PO TABS: 40.0000 mg | ORAL_TABLET | Freq: Every day | ORAL | Status: DC

## 2015-04-28 NOTE — Progress Notes (Signed)
Pre visit review using our clinic review tool, if applicable. No additional management support is needed unless otherwise documented below in the visit note. 

## 2015-04-28 NOTE — Assessment & Plan Note (Signed)
Continue Tylenol prn 

## 2015-04-28 NOTE — Assessment & Plan Note (Addendum)
Deteriorated Increase Celexa to 40 mg daily Support offered today CMET today

## 2015-04-28 NOTE — Assessment & Plan Note (Signed)
Controlled on Lisinopril and HCTZ Will check CBC and CMET today 

## 2015-04-28 NOTE — Assessment & Plan Note (Signed)
Will repeat Lipid Profile and CMET today Will adjust Zocor if needed Encouraged her to consume a low fat diet

## 2015-04-28 NOTE — Assessment & Plan Note (Signed)
History of Barretts Will continue Protonix at 40 mg daily CBC and CMET today

## 2015-04-28 NOTE — Patient Instructions (Signed)
Addiction and the Family  WHAT IS ADDICTION?  Addiction is a complex disease of the brain. It causes an uncontrollable (compulsive) need for a substance. You can be addicted to alcohol or illegal drugs or to prescription medicines, such as painkillers. Addiction can also be a behavior, like gambling or shopping. The need for the drug or activity can become so strong that you think about it all the time. You can also become physically dependent on a substance.   Addiction can change the way your brain works. Because of these changes, getting more of whatever you are addicted to becomes the most important thing to you and feels better than other activities or relationships. Addiction can lead to changes in health, behavior, emotions, relationships, and choices that affect you and everyone around you.  WHAT ARE COMMON SIGNS OF ADDICTION?  Addiction can cause behavioral and emotional signs, as well as physical signs.  Behavioral signs of addiction may include:   Having an intense craving for whatever you are addicted to.   Always thinking about your addiction.   Planning your life around your addiction.   Being unable to stop using a substance or participating in a behavior.   Devoting more time to the addiction. This might mean you no longer go to school or work or spend time with people you enjoy.   Having an increasing need for money. An addiction might make you ask people for unusual loans or steal items to sell.   Having exaggerated emotional responses to difficult situations. These may include:    Feeling anxious or stressed out.    Extreme irritability.    Aggression.    Lying.   Continuing with the addiction even after it has caused a bad outcome, such as:    Poor health.    Damaged relationships.    Money loss.    Job loss.    Getting hurt or arrested.   Having trouble being realistic about the negative effects of addiction.  Physical signs of addiction may include:   Bloodshot  eyes.   Nosebleeds.   Poor hygiene.   Changing sleep patterns.   Shakes and tremors.   Slurred speech.   Confusion.   Unconsciousness.  HOW CAN ADDICTION AFFECT FAMILY MEMBERS?  Addiction affects everyone in a family. It touches all aspects of family life, including finances, communication, work, school, and free time.  Children of an addict might have:    Birth defects, if the mother was addicted during pregnancy.   Physical, emotional, and behavioral problems.   Trouble in school.   Injury from violence, abuse, or accidents.   Problems because of neglect.   A greater risk for addiction later in life.  Parents of an addict might experience:    Confusion.   Worry.   Guilt.   Fear.   Sadness.   A desire to make the addiction seem less of a problem than it is.   Financial strain.   Feeling isolated from family and friends.   Physical health changes from stress or from violent confrontations.  Brothers, sisters, and extended family members of an addict might experience:   Worry.   Anger.   Fear.   Resentment.   Confusion.   A desire to hide the addiction and its impact.   Financial strain.  HOW DO I KNOW IF TREATMENT FOR ADDICTION IS NEEDED?  Addiction is a progressive disease. Without treatment, addiction can get worse. Living with addiction puts you at higher risk for   injury, poor health, lost employment, loss of money, and even death.   You might need treatment for addiction if:   You have tried to stop or cut down, and you cannot.   Your addiction is causing physical health problems.   You find it annoying that your friends and family are concerned about your alcohol or substance use.   You feel guilty about substance abuse or a behavior.   You have lied or tried to hide your addiction.   You need a particular substance to start your day or calm down.   You are getting in trouble at school, work, home, or with the police.   You have done something illegal to support your  addiction.   You are running out of money because of your addiction.   You have no time for anything other than your addiction.  WHAT TYPES OF TREATMENT OPTIONS ARE AVAILABLE?  Treatment for the Addict  The treatment program that is right for you will depend on many factors, including the type of addiction you have. Treatment programs can be outpatient or inpatient. In an outpatient program, you live at home and go to work but also go to a clinic for treatment. With an inpatient program, you sleep and live at the program facility during treatment. After treatment, you might need a plan for support during recovery. Other treatment options include:   Medicine.    Some addictions may be treated with prescription medicines.    You might also need medicine to treat anxiety or depression.   Counseling and behavior therapy. Therapy can help individuals and families behave and relate more effectively.   Support groups. Confidential group therapy, such as twelve-step program, can help individuals and families during treatment and recovery.  Treatment for Family Members  Addiction affects the entire family. Ask your health care provider or counselor to recommend resources for family members. You can call [employer] at 1-800-477-6291 or visit their website at http://www.nar-anon.org/find-a-group/  WHERE ELSE CAN I GET HELP?   Ask your health care provider for help finding addiction treatment. These discussions are confidential.   The National Council on Alcoholism and Drug Dependence (NCADD). This group has information on treatment centers and programs for people who have an addiction and for family members.    Their telephone number is 1-800-NCA-CALL.    Their website is https://ncadd.org/affiliate-network/find-an-affiliate   The Substance Abuse and Mental Health Services Administration (SAMHSA). This group will help you find publicly funded treatment centers, help hotlines, and counseling services near you.    Their  telephone number is 1-800-662-HELP (4357).    Their website is www.findtreatment.samhsa.gov     This information is not intended to replace advice given to you by your health care provider. Make sure you discuss any questions you have with your health care provider.     Document Released: 12/30/2003 Document Revised: 05/16/2014 Document Reviewed: 07/15/2013  Elsevier Interactive Patient Education 2016 [employer]

## 2015-04-28 NOTE — Assessment & Plan Note (Addendum)
Will repeat A1C and Lipid Profile today No microalbumin secondary to ACEI Eye exam UTD Foot exam today Flu and Prevnar today Encouraged her to consume a low carb, low fat diet Advised her to start taking a baby ASA daily

## 2015-04-28 NOTE — Assessment & Plan Note (Signed)
Continue Flonase, Zyrtec and Pazeo

## 2015-04-28 NOTE — Progress Notes (Signed)
HPI  Pt presents to the clinic today for 6 month follow up of chronic condtions.  Arthritis: Occasional. Seems to be most severe in her neck. Worse with cold weather. Taking Tylenol 2-3 times per week for symptoms.   Depression: She reports recent worsening anxiety due to her husbands worsening health. She was started on Celexa 09/01/14 for her anxiety. She does report increased anxiety and depression secondary to a gambling problem she has that is causing financial struggles. She would like referral to a therapist at this time.  DM 2: She is testing her a few times per month. She has not had any hypoglycemia. She feels like her fasting sugars have increased since starting Glipizide. Her last A1C was 6.3%, 5/16. Her last LDL was 126, she is on Zocor. She tries to comply with diabetic diet. She takes Glipizide twice daily. Had severe diarrhea with Metformin. Has had diabetic eye exam 12/2014- no retinopathy. Had foot exam 5/16. Flu and Prevnar given 01/2015.  GERD: On Protonix daily. Has not had breakthrough issues. Recent UGI- no evidence of Barrett's.  HTN: Well controlled on Lisinopril-HCTZ. BP today 120/78. She does report intermittent dizziness.Denies chest pain, chest tightness, SOB.   HLD: Her last LDL was 126. She was started on Zocor. She denies myalgias. She does try to consume a low fat diet.  Allergies: Occurs all year long. She takes Flonase, Public relations account executive as prescribed.   Past Medical History  Diagnosis Date  . Arthritis   . Chicken pox   . Depression   . Diabetes mellitus without complication   . Diverticulitis   . GERD (gastroesophageal reflux disease)   . Allergy   . Hypertension   . Hyperlipidemia   . History of colon polyps     Current Outpatient Prescriptions  Medication Sig Dispense Refill  . Blood Glucose Monitoring Suppl (ONE TOUCH ULTRA MINI) W/DEVICE KIT     . cetirizine (ZYRTEC) 10 MG tablet Take 10 mg by mouth daily.    . citalopram (CELEXA) 20 MG tablet  TAKE 1 TABLET BY MOUTH DAILY 30 tablet 2  . estrogens, conjugated, (PREMARIN) 0.3 MG tablet Take by mouth.    . fluticasone (FLONASE) 50 MCG/ACT nasal spray Place 2 sprays into both nostrils daily.    Marland Kitchen glipiZIDE (GLUCOTROL) 5 MG tablet TAKE 1 TABLET (5 MG TOTAL) BY MOUTH 2 (TWO) TIMES DAILY BEFORE A MEAL. 60 tablet 11  . glucose blood test strip 1 each by Other route 3 (three) times daily. 100 each 11  . lisinopril-hydrochlorothiazide (PRINZIDE,ZESTORETIC) 20-25 MG tablet Take 1 tablet by mouth daily. 30 tablet 11  . Olopatadine HCl (PAZEO) 0.7 % SOLN Apply to eye.    . Omega-3 Fatty Acids (FISH OIL) 300 MG CAPS Take 1 capsule by mouth 2 (two) times daily.    Glory Rosebush DELICA LANCETS 74B MISC     . predniSONE (DELTASONE) 20 MG tablet Take 60 mg on days 1-3, take 40 mg on days 4-6, take 20 mg on days 7-9 18 tablet 0  . simvastatin (ZOCOR) 10 MG tablet TAKE 1 TABLET BY MOUTH AT BEDTIME 30 tablet 11  . pantoprazole (PROTONIX) 40 MG tablet Take 1 tablet by mouth daily.     No current facility-administered medications for this visit.    Allergies  Allergen Reactions  . Doxycycline Diarrhea and Nausea And Vomiting  . Nsaids Other (See Comments)    Bleeding ulcer    Family History  Problem Relation Age of Onset  .  Alcohol abuse Mother   . Hyperlipidemia Mother   . Heart disease Mother   . Hypertension Mother   . Alcohol abuse Father   . Hyperlipidemia Father   . Heart disease Father   . Hypertension Father   . Alcohol abuse Maternal Aunt   . Hypertension Maternal Aunt   . Kidney disease Maternal Aunt   . Alcohol abuse Maternal Uncle   . Hypertension Maternal Uncle   . Alcohol abuse Paternal Aunt   . Birth defects Paternal Aunt     Colon, Breast, Lung  . Heart disease Paternal Aunt   . Breast cancer Paternal Aunt   . Alcohol abuse Paternal Uncle   . Heart disease Paternal Uncle   . Stroke Maternal Grandmother   . Hypertension Maternal Grandmother   . Alcohol abuse Maternal  Grandfather   . Heart disease Maternal Grandfather   . Hypertension Maternal Grandfather     Social History   Social History  . Marital Status: Married    Spouse Name: N/A  . Number of Children: N/A  . Years of Education: N/A   Occupational History  . Not on file.   Social History Main Topics  . Smoking status: Former Research scientist (life sciences)  . Smokeless tobacco: Never Used     Comment: quit 2007  . Alcohol Use: No  . Drug Use: No  . Sexual Activity: Yes   Other Topics Concern  . Not on file   Social History Narrative    ROS:  Constitutional: Denies fever, malaise, fatigue, headache or abrupt weight changes.  Respiratory: Denies difficulty breathing, shortness of breath, cough or sputum production.   Cardiovascular: Denies chest pain, chest tightness, palpitations or swelling in the hands or feet.  Gastrointestinal: Denies abdominal pain, bloating, constipation, diarrhea or blood in the stool.  Musculoskeletal:  Denies decrease in range of motion, difficulty with gait, muscle pain or joint  swelling.  Skin: Denies redness, rashes, lesions or ulcercations.  Neurological: Denies dizziness, difficulty with memory, difficulty with speech or problems with balance and coordination.  Psych: Pt has history of anxiety and depression. SI/HI.  No other specific complaints in a complete review of systems (except as listed in HPI above).  PE:  BP 120/78 mmHg  Pulse 68  Temp(Src) 98.2 F (36.8 C) (Oral)  Wt 202 lb (91.627 kg)  LMP  (LMP Unknown)   Wt Readings from Last 3 Encounters:  01/20/15 206 lb (93.441 kg)  11/18/14 207 lb (93.895 kg)  11/07/14 208 lb (94.348 kg)    General: Appears her stated age, well developed, well nourished in NAD. HEENT: PERRLA. Mucosa boggy and moist. Throat mucosa pink. No lesions or ulcerations noted. Skin: Warm, dry and intact. No rashes or lesions noted. Cardiovascular: Normal rate and rhythm. S1,S2 noted.  No murmur, rubs or gallops noted. No JVD or  BLE edema. No carotid bruits noted. Pulmonary/Chest: Normal effort and positive vesicular breath sounds. No respiratory distress. No wheezes, rales or ronchi noted.  Abdomen: Soft and nontender. Normal bowel sounds. No distention or masses noted.  Musculoskeletal: Normal flexion, extension and lateral rotations of the neck. Some tenderness noted with palpation of the cervical spine.  Neurological: Alert and oriented. Sensation intact to BLE. Psychiatric: She is tearful throughout the exam    Assessment and Plan:  Gambling addiction:  Referral placed to psychology for counseling  RTC in 6 months for follow up of chronic conditions

## 2015-05-01 ENCOUNTER — Emergency Department
Admission: EM | Admit: 2015-05-01 | Discharge: 2015-05-02 | Disposition: A | Discharge status: Home / Self Care | Payer: Commercial Managed Care - PPO | Attending: Emergency Medicine | Admitting: Emergency Medicine

## 2015-05-01 DIAGNOSIS — R062 Wheezing: Secondary | ICD-10-CM | POA: Diagnosis not present

## 2015-05-01 DIAGNOSIS — I10 Essential (primary) hypertension: Secondary | ICD-10-CM | POA: Diagnosis not present

## 2015-05-01 DIAGNOSIS — Z87891 Personal history of nicotine dependence: Secondary | ICD-10-CM | POA: Insufficient documentation

## 2015-05-01 DIAGNOSIS — J683 Other acute and subacute respiratory conditions due to chemicals, gases, fumes and vapors: Secondary | ICD-10-CM | POA: Diagnosis not present

## 2015-05-01 DIAGNOSIS — Y9289 Other specified places as the place of occurrence of the external cause: Secondary | ICD-10-CM | POA: Insufficient documentation

## 2015-05-01 DIAGNOSIS — Y9389 Activity, other specified: Secondary | ICD-10-CM | POA: Diagnosis not present

## 2015-05-01 DIAGNOSIS — R509 Fever, unspecified: Secondary | ICD-10-CM | POA: Insufficient documentation

## 2015-05-01 DIAGNOSIS — Z7951 Long term (current) use of inhaled steroids: Secondary | ICD-10-CM | POA: Diagnosis not present

## 2015-05-01 DIAGNOSIS — Y998 Other external cause status: Secondary | ICD-10-CM | POA: Diagnosis not present

## 2015-05-01 DIAGNOSIS — Z79899 Other long term (current) drug therapy: Secondary | ICD-10-CM | POA: Insufficient documentation

## 2015-05-01 DIAGNOSIS — Z7984 Long term (current) use of oral hypoglycemic drugs: Secondary | ICD-10-CM | POA: Diagnosis not present

## 2015-05-01 DIAGNOSIS — E119 Type 2 diabetes mellitus without complications: Secondary | ICD-10-CM | POA: Insufficient documentation

## 2015-05-01 DIAGNOSIS — T65894A Toxic effect of other specified substances, undetermined, initial encounter: Secondary | ICD-10-CM | POA: Diagnosis not present

## 2015-05-01 NOTE — ED Notes (Signed)
Pt states uri symptoms with associated fever at home. Pt complains of clear to yellow nasal drainage, sore throat, cough. Pt appears in no acute distress in triage.

## 2015-05-02 ENCOUNTER — Emergency Department: Payer: Commercial Managed Care - PPO

## 2015-05-02 LAB — POCT RAPID STREP A: STREPTOCOCCUS, GROUP A SCREEN (DIRECT): NEGATIVE

## 2015-05-02 LAB — GLUCOSE, CAPILLARY: Glucose-Capillary: 141 mg/dL — ABNORMAL HIGH (ref 65–99)

## 2015-05-02 MED ORDER — AZITHROMYCIN 250 MG PO TABS: ORAL_TABLET | ORAL | Status: AC

## 2015-05-02 MED ORDER — PREDNISONE 10 MG PO TABS: 10.0000 mg | ORAL_TABLET | Freq: Every day | ORAL | Status: DC

## 2015-05-02 MED ORDER — IPRATROPIUM-ALBUTEROL 0.5-2.5 (3) MG/3ML IN SOLN
3.0000 mL | Freq: Once | RESPIRATORY_TRACT | Status: AC
  Administered 2015-05-02: 3 mL via RESPIRATORY_TRACT
  Filled 2015-05-02: qty 3

## 2015-05-02 MED ORDER — ACETAMINOPHEN 500 MG PO TABS
1000.0000 mg | ORAL_TABLET | Freq: Once | ORAL | Status: AC
  Administered 2015-05-02: 1000 mg via ORAL
  Filled 2015-05-02: qty 2

## 2015-05-02 MED ORDER — ALBUTEROL SULFATE HFA 108 (90 BASE) MCG/ACT IN AERS: 2.0000 | INHALATION_SPRAY | Freq: Four times a day (QID) | RESPIRATORY_TRACT | Status: DC | PRN

## 2015-05-02 NOTE — Discharge Instructions (Signed)
Bronchospasm, Adult  A bronchospasm is a spasm or tightening of the airways going into the lungs. During a bronchospasm breathing becomes more difficult because the airways get smaller. When this happens there can be coughing, a whistling sound when breathing (wheezing), and difficulty breathing. Bronchospasm is often associated with asthma, but not all patients who experience a bronchospasm have asthma.  CAUSES   A bronchospasm is caused by inflammation or irritation of the airways. The inflammation or irritation may be triggered by:   · Allergies (such as to animals, pollen, food, or mold). Allergens that cause bronchospasm may cause wheezing immediately after exposure or many hours later.    · Infection. Viral infections are believed to be the most common cause of bronchospasm.    · Exercise.    · Irritants (such as pollution, cigarette smoke, strong odors, aerosol sprays, and paint fumes).    · Weather changes. Winds increase molds and pollens in the air. Rain refreshes the air by washing irritants out. Cold air may cause inflammation.    · Stress and emotional upset.    SIGNS AND SYMPTOMS   · Wheezing.    · Excessive nighttime coughing.    · Frequent or severe coughing with a simple cold.    · Chest tightness.    · Shortness of breath.    DIAGNOSIS   Bronchospasm is usually diagnosed through a history and physical exam. Tests, such as chest X-rays, are sometimes done to look for other conditions.  TREATMENT   · Inhaled medicines can be given to open up your airways and help you breathe. The medicines can be given using either an inhaler or a nebulizer machine.  · Corticosteroid medicines may be given for severe bronchospasm, usually when it is associated with asthma.  HOME CARE INSTRUCTIONS   · Always have a plan prepared for seeking medical care. Know when to call your health care provider and local emergency services (911 in the [place] Know where you can access local emergency care.  · Only take medicines as  directed by your health care provider.  · If you were prescribed an inhaler or nebulizer machine, ask your health care provider to explain how to use it correctly. Always use a spacer with your inhaler if you were given one.  · It is necessary to remain calm during an attack. Try to relax and breathe more slowly.   · Control your home environment in the following ways:      Change your heating and air conditioning filter at least once a month.      Limit your use of fireplaces and wood stoves.    Do not smoke and do not allow smoking in your home.      Avoid exposure to perfumes and fragrances.      Get rid of pests (such as roaches and mice) and their droppings.      Throw away plants if you see mold on them.      Keep your house clean and dust free.      Replace carpet with wood, tile, or vinyl flooring. Carpet can trap dander and dust.      Use allergy-proof pillows, mattress covers, and box spring covers.      Wash bed sheets and blankets every week in hot water and dry them in a dryer.      Use blankets that are made of polyester or cotton.      Wash hands frequently.  SEEK MEDICAL CARE IF:   · You have muscle aches.    · You have chest pain.    · The sputum changes from clear or   even after taking your prescribed medicines.   You have increased difficulty breathing.   You develop severe chest pain. MAKE SURE YOU:   Understand these instructions.  Will watch your condition.  Will get help right away if you are not doing well or get worse.   This information is not intended to replace advice given to you by your health care provider. Make sure you discuss any questions you have with your health care  provider.   Document Released: 04/28/2003 Document Revised: 05/16/2014 Document Reviewed: 10/15/2012 Elsevier Interactive Patient Education 2016 [employer]  Please use the inhaler 2 puffs 4 times a day as needed for cough and wheezing. I will also give you some prednisone which should help with the cough and wheezing and Zithromax antibiotics. Please follow-up with your doctor or [facility] or [facility] or [facility]. Please return if you get worse

## 2015-05-02 NOTE — ED Provider Notes (Signed)
[facility] Emergency Department Provider Note  ____________________________________________  Time seen: Approximately 12:56 AM  I have reviewed the triage vital signs and the nursing notes.   HISTORY  Chief Complaint Fever and URI    HPI Hannah Calhoun is a 54 y.o. female who reports almost a week of cough productive of small amounts of yellow phlegm and fever up to 100.3 nasal drainage  and sinus stuffiness. Patient reports she has trouble stopping coughing. Robitussin as not working for her. She is not really feeling short of breath.   Past Medical History  Diagnosis Date  . Arthritis   . Chicken pox   . Depression   . Diabetes mellitus without complication ([place])   . Diverticulitis   . GERD (gastroesophageal reflux disease)   . Allergy   . Hypertension   . Hyperlipidemia   . History of colon polyps     Patient Active Problem List   Diagnosis Date Noted  . Seasonal allergies 04/28/2015  . 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 Anxiety and depression 09/01/2014  . Obesity (BMI 30-39.9) 03/11/2014  . DM2 (diabetes mellitus, type 2) ([place]) 03/11/2014  . HTN (hypertension) 03/11/2014  . HLD (hyperlipidemia) 03/11/2014  . GERD (gastroesophageal reflux disease) 03/11/2014  . Arthritis 03/11/2014    Past Surgical History  Procedure Laterality Date  . Cholecystectomy    . Breast surgery Right 1990  . Tonsillectomy    . Abdominal hysterectomy  2009  . Knee arthroscopy  2010  . Nasal septum surgery  2010  . Breast biopsy Right 1990    EXCISIONAL - NEG    Current Outpatient Rx  Name  Route  Sig  Dispense  Refill  . albuterol (PROVENTIL HFA;VENTOLIN HFA) 108 (90 BASE) MCG/ACT inhaler   Inhalation   Inhale 2 puffs into the lungs every 6 (six) hours as needed for wheezing or shortness of breath.   1 Inhaler   2   . azithromycin (ZITHROMAX Z-PAK) 250 MG tablet      Take 2 tablets (500 mg) on  Day 1,  followed by 1 tablet (250  mg) once daily on Days 2 through 5.   6 each   0   . Blood Glucose Monitoring Suppl (ONE TOUCH ULTRA MINI) W/DEVICE KIT               . cetirizine (ZYRTEC) 10 MG tablet   Oral   Take 10 mg by mouth daily. Reported on 04/28/2015         . citalopram (CELEXA) 40 MG tablet   Oral   Take 1 tablet (40 mg total) by mouth daily.   30 tablet   3   . fluticasone (FLONASE) 50 MCG/ACT nasal spray   Each Nare   Place 2 sprays into both nostrils daily. Reported on 04/28/2015         . glipiZIDE (GLUCOTROL) 5 MG tablet      TAKE 1 TABLET (5 MG TOTAL) BY MOUTH 2 (TWO) TIMES DAILY BEFORE A MEAL.   60 tablet   11   . glucose blood test strip   Other   1 each by Other route 3 (three) times daily.   100 each   11   . lisinopril-hydrochlorothiazide (PRINZIDE,ZESTORETIC) 20-25 MG tablet   Oral   Take 1 tablet by mouth daily.   30 tablet   11   . Olopatadine HCl (PAZEO) 0.7 % SOLN   Ophthalmic   Apply to eye.         Marland Kitchen  Omega-3 Fatty Acids (FISH OIL) 300 MG CAPS   Oral   Take 1 capsule by mouth 2 (two) times daily.         Glory Rosebush DELICA LANCETS 06T MISC               . EXPIRED: pantoprazole (PROTONIX) 40 MG tablet   Oral   Take 1 tablet by mouth daily.         . predniSONE (DELTASONE) 10 MG tablet   Oral   Take 1 tablet (10 mg total) by mouth daily. Take 2 tabs by mouth one time a day.   10 tablet   0   . simvastatin (ZOCOR) 10 MG tablet      TAKE 1 TABLET BY MOUTH AT BEDTIME   30 tablet   11     Allergies Doxycycline and Nsaids  Family History  Problem Relation Age of Onset  . Alcohol abuse Mother   . Hyperlipidemia Mother   . Heart disease Mother   . Hypertension Mother   . Alcohol abuse Father   . Hyperlipidemia Father   . Heart disease Father   . Hypertension Father   . Alcohol abuse Maternal Aunt   . Hypertension Maternal Aunt   . Kidney disease Maternal Aunt   . Alcohol abuse Maternal Uncle   . Hypertension Maternal Uncle   .  Alcohol abuse Paternal Aunt   . Birth defects Paternal Aunt     Colon, Breast, Lung  . Heart disease Paternal Aunt   . Breast cancer Paternal Aunt   . Alcohol abuse Paternal Uncle   . Heart disease Paternal Uncle   . Stroke Maternal Grandmother   . Hypertension Maternal Grandmother   . Alcohol abuse Maternal Grandfather   . Heart disease Maternal Grandfather   . Hypertension Maternal Grandfather     Social History Social History  Substance Use Topics  . Smoking status: Former Research scientist (life sciences)  . Smokeless tobacco: Never Used     Comment: quit 2007  . Alcohol Use: No    Review of Systems Constitutional:  Fever nochills Eyes: No visual changes. ENT: No sore throat. Cardiovascular: Denies chest pain. Respiratory: Denies shortness of breath. Gastrointestinal: No abdominal pain.  No nausea, no vomiting.  No diarrhea.  No constipation. Genitourinary: Negative for dysuria. Musculoskeletal: Negative for back pain. Skin: Negative for rash. Neurological: Negative for  focal weakness or numbness.  10-point ROS otherwise negative.  ____________________________________________   PHYSICAL EXAM:  VITAL SIGNS: ED Triage Vitals  Enc Vitals Group     BP 05/01/15 2337 137/65 mmHg     Pulse Rate 05/01/15 2337 84     Resp 05/01/15 2337 16     Temp 05/01/15 2337 99.7 F (37.6 C)     Temp src --      SpO2 05/01/15 2337 98 %     Weight 05/01/15 2337 203 lb (92.08 kg)     Height 05/01/15 2337 _0  (1.676 m)     Head Cir --      Peak Flow --      Pain Score 05/01/15 2338 3     Pain Loc --      Pain Edu? --      Excl. in [place]? --    Constitutional: Alert and oriented. Well appearing and in no acute distress. Eyes: Conjunctivae are normal. PERRL. EOMI. Head: Atraumatic. Nose: congestion/rhinnorhea. No sinus tenderness palpation and percussion Mouth/Throat: Mucous membranes are moist.  Oropharynx erythematous. Neck: No stridor.  Cardiovascular: Normal rate, regular rhythm. Grossly normal  heart sounds.  Good peripheral circulation. Respiratory: Normal respiratory effort.  No retractions. Lungs diffuse wheezes Gastrointestinal: Soft and nontender. No distention. No abdominal bruits. No CVA tenderness. Musculoskeletal: No lower extremity tenderness nor edema.  No joint effusions. Neurologic:  Normal speech and language. No gross focal neurologic deficits are appreciated. No gait instability. Skin:  Skin is warm, dry and intact. No rash noted. Psychiatric: Mood and affect are normal. Speech and behavior are normal.  ____________________________________________   LABS (all labs ordered are listed, but only abnormal results are displayed)  Labs Reviewed  GLUCOSE, CAPILLARY - Abnormal; Notable for the following:    Glucose-Capillary 141 (*)    All other components within normal limits  CULTURE, GROUP A STREP ([facility] ONLY)  CBG MONITORING, ED  POCT RAPID STREP A   ____________________________________________  EKG   ____________________________________________  RADIOLOGY  Chest x-ray Read by radiology as no acute disease ____________________________________________   PROCEDURES    ____________________________________________   INITIAL IMPRESSION / ASSESSMENT AND PLAN / ED COURSE  Pertinent labs & imaging results that were available during my care of the patient were reviewed by me and considered in my medical decision making (see chart for details).   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

## 2015-05-02 NOTE — ED Notes (Signed)
Rapid Strep is negative.

## 2015-05-04 LAB — CULTURE, GROUP A STREP (THRC)

## 2015-05-27 ENCOUNTER — Telehealth: Payer: Self-pay | Admitting: Internal Medicine

## 2015-05-27 NOTE — Telephone Encounter (Signed)
FYI: [facility] called pt [facility] and she has declined to accept a referral at this time.

## 2015-05-27 NOTE — Telephone Encounter (Signed)
noted 

## 2015-06-11 ENCOUNTER — Encounter: Payer: Self-pay | Admitting: Internal Medicine

## 2015-06-11 ENCOUNTER — Ambulatory Visit (INDEPENDENT_AMBULATORY_CARE_PROVIDER_SITE_OTHER): Payer: Commercial Managed Care - PPO | Admitting: Psychology

## 2015-06-11 ENCOUNTER — Ambulatory Visit (INDEPENDENT_AMBULATORY_CARE_PROVIDER_SITE_OTHER): Payer: Commercial Managed Care - PPO | Admitting: Internal Medicine

## 2015-06-11 VITALS — BP 128/74 | HR 58 | Temp 98.1°F | Wt 204.0 lb

## 2015-06-11 DIAGNOSIS — M7072 Other bursitis of hip, left hip: Secondary | ICD-10-CM

## 2015-06-11 DIAGNOSIS — F4323 Adjustment disorder with mixed anxiety and depressed mood: Secondary | ICD-10-CM | POA: Diagnosis not present

## 2015-06-11 DIAGNOSIS — F630 Pathological gambling: Secondary | ICD-10-CM | POA: Diagnosis not present

## 2015-06-11 MED ORDER — PREDNISONE 20 MG PO TABS: ORAL_TABLET | ORAL | Status: DC

## 2015-06-11 NOTE — Patient Instructions (Signed)
Trochanteric Bursitis You have hip pain due to trochanteric bursitis. Bursitis means that the sack near the outside of the hip is filled with fluid and inflamed. This sack is made up of protective soft tissue. The pain from trochanteric bursitis can be severe and keep you from sleep. It can radiate to the buttocks or down the outside of the thigh to the knee. The pain is almost always worse when rising from the seated or lying position and with walking. Pain can improve after you take a few steps. It happens more often in people with hip joint and lumbar spine problems, such as arthritis or previous surgery. Very rarely the trochanteric bursa can become infected, and antibiotics and/or surgery may be needed. Treatment often includes an injection of local anesthetic mixed with cortisone medicine. This medicine is injected into the area where it is most tender over the hip. Repeat injections may be necessary if the response to treatment is slow. You can apply ice packs over the tender area for 30 minutes every 2 hours for the next few days. Anti-inflammatory and/or narcotic pain medicine may also be helpful. Limit your activity for the next few days if the pain continues. See your caregiver in 5-10 days if you are not greatly improved.  SEEK IMMEDIATE MEDICAL CARE IF:  You develop severe pain, fever, or increased redness.  You have pain that radiates below the knee. EXERCISES STRETCHING EXERCISES - Trochanteric Bursitis  These exercises may help you when beginning to rehabilitate your injury. Your symptoms may resolve with or without further involvement from your physician, physical therapist, or athletic trainer. While completing these exercises, remember:   Restoring tissue flexibility helps normal motion to return to the joints. This allows healthier, less painful movement and activity.  An effective stretch should be held for at least 30 seconds.  A stretch should never be painful. You should only  feel a gentle lengthening or release in the stretched tissue. STRETCH - Iliotibial Band  On the floor or bed, lie on your side so your injured leg is on top. Bend your knee and grab your ankle.  Slowly bring your knee back so that your thigh is in line with your trunk. Keep your heel at your buttocks and gently arch your back so your head, shoulders and hips line up.  Slowly lower your leg so that your knee approaches the floor/bed until you feel a gentle stretch on the outside of your thigh. If you do not feel a stretch and your knee will not fall farther, place the heel of your opposite foot on top of your knee and pull your thigh down farther.  Hold this stretch for __________ seconds.  Repeat __________ times. Complete this exercise __________ times per day. STRETCH - Hamstrings, Supine   Lie on your back. Loop a belt or towel over the ball of your foot as shown.  Straighten your knee and slowly pull on the belt to raise your injured leg. Do not allow the knee to bend. Keep your opposite leg flat on the floor.  Raise the leg until you feel a gentle stretch behind your knee or thigh. Hold this position for __________ seconds.  Repeat __________ times. Complete this stretch __________ times per day. STRETCH - Quadriceps, Prone   Lie on your stomach on a firm surface, such as a bed or padded floor.  Bend your knee and grasp your ankle. If you are unable to reach your ankle or pant leg, use a belt   around your foot to lengthen your reach.  Gently pull your heel toward your buttocks. Your knee should not slide out to the side. You should feel a stretch in the front of your thigh and/or knee.  Hold this position for __________ seconds.  Repeat __________ times. Complete this stretch __________ times per day. STRETCHING - Hip Flexors, Lunge Half kneel with your knee on the floor and your opposite knee bent and directly over your ankle.  Keep good posture with your head over your  shoulders. Tighten your buttocks to point your tailbone downward; this will prevent your back from arching too much.  You should feel a gentle stretch in the front of your thigh and/or hip. If you do not feel any resistance, slightly slide your opposite foot forward and then slowly lunge forward so your knee once again lines up over your ankle. Be sure your tailbone remains pointed downward.  Hold this stretch for __________ seconds.  Repeat __________ times. Complete this stretch __________ times per day. STRETCH - Adductors, Lunge  While standing, spread your legs.  Lean away from your injured leg by bending your opposite knee. You may rest your hands on your thigh for balance.  You should feel a stretch in your inner thigh. Hold for __________ seconds.  Repeat __________ times. Complete this exercise __________ times per day.   This information is not intended to replace advice given to you by your health care provider. Make sure you discuss any questions you have with your health care provider.   Document Released: 06/02/2004 Document Revised: 09/09/2014 Document Reviewed: 08/07/2008 Elsevier Interactive Patient Education 2016 [employer].  

## 2015-06-11 NOTE — Progress Notes (Signed)
Subjective:    Patient ID: Hannah Calhoun, female    DOB: 1961-04-19, 55 y.o.   MRN: [id number]  HPI  Pt presents to the clinic today with c/o left hip pain. This started 1-2 months ago. She describes the pain as sharp and stabbing. The pain does not radiate. She denies numbness and tingling down her leg. The pain seems worse with certain movements like walking up stairs and laying on her left side. She is taking OTC Tylenol with no relief. She denies any specific injury to the area.  Review of Systems      Past Medical History  Diagnosis Date  . Arthritis   . Chicken pox   . Depression   . Diabetes mellitus without complication ([place])   . Diverticulitis   . GERD (gastroesophageal reflux disease)   . Allergy   . Hypertension   . Hyperlipidemia   . History of colon polyps     Current Outpatient Prescriptions  Medication Sig Dispense Refill  . albuterol (PROVENTIL HFA;VENTOLIN HFA) 108 (90 BASE) MCG/ACT inhaler Inhale 2 puffs into the lungs every 6 (six) hours as needed for wheezing or shortness of breath. 1 Inhaler 2  . aspirin 81 MG tablet Take 81 mg by mouth daily.    . Blood Glucose Monitoring Suppl (ONE TOUCH ULTRA MINI) W/DEVICE KIT     . cetirizine (ZYRTEC) 10 MG tablet Take 10 mg by mouth daily. Reported on 04/28/2015    . citalopram (CELEXA) 40 MG tablet Take 1 tablet (40 mg total) by mouth daily. 30 tablet 3  . fluticasone (FLONASE) 50 MCG/ACT nasal spray Place 2 sprays into both nostrils daily. Reported on 04/28/2015    . glipiZIDE (GLUCOTROL) 5 MG tablet TAKE 1 TABLET (5 MG TOTAL) BY MOUTH 2 (TWO) TIMES DAILY BEFORE A MEAL. 60 tablet 11  . glucose blood test strip 1 each by Other route 3 (three) times daily. 100 each 11  . lisinopril-hydrochlorothiazide (PRINZIDE,ZESTORETIC) 20-25 MG tablet Take 1 tablet by mouth daily. 30 tablet 11  . Olopatadine HCl (PAZEO) 0.7 % SOLN Apply to eye.    . Omega-3 Fatty Acids (FISH OIL) 300 MG CAPS Take 1 capsule by mouth 2  (two) times daily.    Glory Rosebush DELICA LANCETS 40C MISC     . simvastatin (ZOCOR) 10 MG tablet TAKE 1 TABLET BY MOUTH AT BEDTIME 30 tablet 11  . pantoprazole (PROTONIX) 40 MG tablet Take 1 tablet by mouth daily.     No current facility-administered medications for this visit.    Allergies  Allergen Reactions  . Doxycycline Diarrhea and Nausea And Vomiting  . Nsaids Other (See Comments)    Bleeding ulcer    Family History  Problem Relation Age of Onset  . Alcohol abuse Mother   . Hyperlipidemia Mother   . Heart disease Mother   . Hypertension Mother   . Alcohol abuse Father   . Hyperlipidemia Father   . Heart disease Father   . Hypertension Father   . Alcohol abuse Maternal Aunt   . Hypertension Maternal Aunt   . Kidney disease Maternal Aunt   . Alcohol abuse Maternal Uncle   . Hypertension Maternal Uncle   . Alcohol abuse Paternal Aunt   . Birth defects Paternal Aunt     Colon, Breast, Lung  . Heart disease Paternal Aunt   . Breast cancer Paternal Aunt   . Alcohol abuse Paternal Uncle   . Heart disease Paternal Uncle   . Stroke  Maternal Grandmother   . Hypertension Maternal Grandmother   . Alcohol abuse Maternal Grandfather   . Heart disease Maternal Grandfather   . Hypertension Maternal Grandfather     Social History   Social History  . Marital Status: Married    Spouse Name: N/A  . Number of Children: N/A  . Years of Education: N/A   Occupational History  . Not on file.   Social History Main Topics  . Smoking status: Former Research scientist (life sciences)  . Smokeless tobacco: Never Used     Comment: quit 2007  . Alcohol Use: No  . Drug Use: No  . Sexual Activity: Yes   Other Topics Concern  . Not on file   Social History Narrative     Constitutional: Denies fever, malaise, fatigue, headache or abrupt weight changes.  Musculoskeletal: Pt reports hip pain. Denies decrease in range of motion, difficulty with gait, muscle pain or joint swelling.  Skin: Denies redness,  rashes, lesions or ulcercations.  Neurological: Denies dizziness, difficulty with memory, difficulty with speech or problems with balance and coordination.    No other specific complaints in a complete review of systems (except as listed in HPI above).  Objective:   Physical Exam  BP 128/74 mmHg  Pulse 58  Temp(Src) 98.1 F (36.7 C) (Oral)  Wt 204 lb (92.534 kg)  SpO2 98%  LMP  (LMP Unknown) Wt Readings from Last 3 Encounters:  06/11/15 204 lb (92.534 kg)  05/01/15 203 lb (92.08 kg)  04/28/15 202 lb (91.627 kg)    General: Appears her stated age, obese in NAD. Musculoskeletal: Normal flexion, extension, internal and external rotation of the hip. Pain with internal rotation. Pain with palpation of the left trochanter. No swelling noted. Strength 5/5 BLE. No difficulty with gait.  Neurological: Alert and oriented.   BMET    Component Value Date/Time   NA 140 04/28/2015 1346   NA 134* 01/22/2013 2140   K 4.0 04/28/2015 1346   K 3.1* 01/22/2013 2140   CL 102 04/28/2015 1346   CL 104 01/22/2013 2140   CO2 31 04/28/2015 1346   CO2 25 01/22/2013 2140   GLUCOSE 107* 04/28/2015 1346   GLUCOSE 102* 01/22/2013 2140   BUN 18 04/28/2015 1346   BUN 10 01/22/2013 2140   CREATININE 1.03 04/28/2015 1346   CREATININE 0.86 01/22/2013 2140   CALCIUM 9.6 04/28/2015 1346   CALCIUM 8.2* 01/22/2013 2140   GFRNONAA >60 01/22/2013 2140   GFRAA >60 01/22/2013 2140    Lipid Panel     Component Value Date/Time   CHOL 170 04/28/2015 1346   TRIG 99.0 04/28/2015 1346   HDL 47.60 04/28/2015 1346   CHOLHDL 4 04/28/2015 1346   VLDL 19.8 04/28/2015 1346   LDLCALC 103* 04/28/2015 1346    CBC    Component Value Date/Time   WBC 9.2 04/28/2015 1346   WBC 9.3 01/22/2013 2140   RBC 4.42 04/28/2015 1346   RBC 4.00 01/22/2013 2140   HGB 13.7 04/28/2015 1346   HGB 12.3 01/22/2013 2140   HCT 41.2 04/28/2015 1346   HCT 35.8 01/22/2013 2140   PLT 433.0* 04/28/2015 1346   PLT 412 01/22/2013  2140   MCV 93.3 04/28/2015 1346   MCV 90 01/22/2013 2140   MCH 30.8 01/22/2013 2140   MCHC 33.3 04/28/2015 1346   MCHC 34.4 01/22/2013 2140   RDW 13.5 04/28/2015 1346   RDW 15.0* 01/22/2013 2140    Hgb A1C Lab Results  Component Value Date  HGBA1C 6.3 04/28/2015        Assessment & Plan:   Trochanteric bursitis, left:  eRx for Pred Taper If persist, make appt with Dr. Lorelei Pont for bursa injection Monitor sugars, let me know if persists > 250  RTC as needed or if symptoms persist or worsen

## 2015-06-11 NOTE — Progress Notes (Signed)
Pre visit review using our clinic review tool, if applicable. No additional management support is needed unless otherwise documented below in the visit note. 

## 2015-06-29 ENCOUNTER — Emergency Department
Admission: EM | Admit: 2015-06-29 | Discharge: 2015-06-29 | Disposition: A | Discharge status: Home / Self Care | Payer: Commercial Managed Care - PPO | Attending: Emergency Medicine | Admitting: Emergency Medicine

## 2015-06-29 ENCOUNTER — Emergency Department: Payer: Commercial Managed Care - PPO

## 2015-06-29 DIAGNOSIS — I10 Essential (primary) hypertension: Secondary | ICD-10-CM | POA: Insufficient documentation

## 2015-06-29 DIAGNOSIS — J069 Acute upper respiratory infection, unspecified: Secondary | ICD-10-CM

## 2015-06-29 DIAGNOSIS — Z87891 Personal history of nicotine dependence: Secondary | ICD-10-CM | POA: Insufficient documentation

## 2015-06-29 DIAGNOSIS — Z79899 Other long term (current) drug therapy: Secondary | ICD-10-CM | POA: Diagnosis not present

## 2015-06-29 DIAGNOSIS — E119 Type 2 diabetes mellitus without complications: Secondary | ICD-10-CM | POA: Insufficient documentation

## 2015-06-29 DIAGNOSIS — Z7984 Long term (current) use of oral hypoglycemic drugs: Secondary | ICD-10-CM | POA: Insufficient documentation

## 2015-06-29 DIAGNOSIS — R05 Cough: Secondary | ICD-10-CM | POA: Diagnosis present

## 2015-06-29 DIAGNOSIS — Z7951 Long term (current) use of inhaled steroids: Secondary | ICD-10-CM | POA: Diagnosis not present

## 2015-06-29 DIAGNOSIS — Z7982 Long term (current) use of aspirin: Secondary | ICD-10-CM | POA: Diagnosis not present

## 2015-06-29 MED ORDER — GUAIFENESIN-CODEINE 100-10 MG/5ML PO SOLN: 10.0000 mL | ORAL | Status: DC | PRN

## 2015-06-29 MED ORDER — AZITHROMYCIN 250 MG PO TABS: ORAL_TABLET | ORAL | Status: DC

## 2015-06-29 MED ORDER — PSEUDOEPHEDRINE HCL 60 MG PO TABS: 60.0000 mg | ORAL_TABLET | ORAL | Status: DC | PRN

## 2015-06-29 MED ORDER — AZELASTINE HCL 0.1 % NA SOLN: 2.0000 | Freq: Two times a day (BID) | NASAL | Status: DC

## 2015-06-29 NOTE — ED Notes (Signed)
Patient discharged as per order.  Instructed in use of nasal spray.  Instructed not to drive or drink alcohol while taking cough medicine with codeine.

## 2015-06-29 NOTE — ED Notes (Signed)
Pt c/o cough and congestion, sore throat for the past 3 days.Hannah Calhoun

## 2015-06-29 NOTE — ED Provider Notes (Signed)
[facility] Emergency Department Provider Note  ____________________________________________  Time seen: Approximately 12:42 PM  I have reviewed the triage vital signs and the nursing notes.   HISTORY  Chief Complaint Sore Throat and Cough    HPI Hannah Calhoun is a 55 y.o. female who presents for evaluation of cough congestion sore throat for about 3 weeks. Also complains of intermittent nose is painful and swollen and difficulty breathing out of her nose. Denies any fever or chills. Nothing makes it better or worse. Symptoms are moderate at 5/10. And seemed to progressively get worse despite any over-the-counter medications.   Past Medical History  Diagnosis Date  . Arthritis   . Chicken pox   . Depression   . Diabetes mellitus without complication ([place])   . Diverticulitis   . GERD (gastroesophageal reflux disease)   . Allergy   . Hypertension   . Hyperlipidemia   . History of colon polyps     Patient Active Problem List   Diagnosis Date Noted  . Seasonal allergies 04/28/2015  . 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 Anxiety and depression 09/01/2014  . Obesity (BMI 30-39.9) 03/11/2014  . DM2 (diabetes mellitus, type 2) ([place]) 03/11/2014  . HTN (hypertension) 03/11/2014  . HLD (hyperlipidemia) 03/11/2014  . GERD (gastroesophageal reflux disease) 03/11/2014  . Arthritis 03/11/2014    Past Surgical History  Procedure Laterality Date  . Cholecystectomy    . Breast surgery Right 1990  . Tonsillectomy    . Abdominal hysterectomy  2009  . Knee arthroscopy  2010  . Nasal septum surgery  2010  . Breast biopsy Right 1990    EXCISIONAL - NEG    Current Outpatient Rx  Name  Route  Sig  Dispense  Refill  . albuterol (PROVENTIL HFA;VENTOLIN HFA) 108 (90 BASE) MCG/ACT inhaler   Inhalation   Inhale 2 puffs into the lungs every 6 (six) hours as needed for wheezing or shortness of breath.   1 Inhaler   2   . aspirin 81 MG tablet  Oral   Take 81 mg by mouth daily.         Marland Kitchen azelastine (ASTELIN) 0.1 % nasal spray   Each Nare   Place 2 sprays into both nostrils 2 (two) times daily. Use in each nostril as directed   30 mL   2   . azithromycin (ZITHROMAX Z-PAK) 250 MG tablet      Take 2 tablets (500 mg) on  Day 1,  followed by 1 tablet (250 mg) once daily on Days 2 through 5.   6 each   0   . Blood Glucose Monitoring Suppl (ONE TOUCH ULTRA MINI) W/DEVICE KIT               . cetirizine (ZYRTEC) 10 MG tablet   Oral   Take 10 mg by mouth daily. Reported on 04/28/2015         . citalopram (CELEXA) 40 MG tablet   Oral   Take 1 tablet (40 mg total) by mouth daily.   30 tablet   3   . fluticasone (FLONASE) 50 MCG/ACT nasal spray   Each Nare   Place 2 sprays into both nostrils daily. Reported on 04/28/2015         . glipiZIDE (GLUCOTROL) 5 MG tablet      TAKE 1 TABLET (5 MG TOTAL) BY MOUTH 2 (TWO) TIMES DAILY BEFORE A MEAL.   60 tablet   11   . glucose  blood test strip   Other   1 each by Other route 3 (three) times daily.   100 each   11   . guaiFENesin-codeine 100-10 MG/5ML syrup   Oral   Take 10 mLs by mouth every 4 (four) hours as needed for cough.   180 mL   0   . lisinopril-hydrochlorothiazide (PRINZIDE,ZESTORETIC) 20-25 MG tablet   Oral   Take 1 tablet by mouth daily.   30 tablet   11   . Olopatadine HCl (PAZEO) 0.7 % SOLN   Ophthalmic   Apply to eye.         . Omega-3 Fatty Acids (FISH OIL) 300 MG CAPS   Oral   Take 1 capsule by mouth 2 (two) times daily.         Glory Rosebush DELICA LANCETS 02O MISC               . EXPIRED: pantoprazole (PROTONIX) 40 MG tablet   Oral   Take 1 tablet by mouth daily.         . predniSONE (DELTASONE) 20 MG tablet      Take 3 tabs on days 1-2, 2 tabs on days 3-4, 1 tab on days 5-6   12 tablet   0   . pseudoephedrine (SUDAFED) 60 MG tablet   Oral   Take 1 tablet (60 mg total) by mouth every 4 (four) hours as needed for  congestion.   24 tablet   0   . simvastatin (ZOCOR) 10 MG tablet      TAKE 1 TABLET BY MOUTH AT BEDTIME   30 tablet   11     Allergies Doxycycline and Nsaids  Family History  Problem Relation Age of Onset  . Alcohol abuse Mother   . Hyperlipidemia Mother   . Heart disease Mother   . Hypertension Mother   . Alcohol abuse Father   . Hyperlipidemia Father   . Heart disease Father   . Hypertension Father   . Alcohol abuse Maternal Aunt   . Hypertension Maternal Aunt   . Kidney disease Maternal Aunt   . Alcohol abuse Maternal Uncle   . Hypertension Maternal Uncle   . Alcohol abuse Paternal Aunt   . Birth defects Paternal Aunt     Colon, Breast, Lung  . Heart disease Paternal Aunt   . Breast cancer Paternal Aunt   . Alcohol abuse Paternal Uncle   . Heart disease Paternal Uncle   . Stroke Maternal Grandmother   . Hypertension Maternal Grandmother   . Alcohol abuse Maternal Grandfather   . Heart disease Maternal Grandfather   . Hypertension Maternal Grandfather     Social History Social History  Substance Use Topics  . Smoking status: Former Research scientist (life sciences)  . Smokeless tobacco: Never Used     Comment: quit 2007  . Alcohol Use: No    Review of Systems Constitutional: Occasional fever/chills none presently. Eyes: No visual changes. ENT: Positive sore throat 3 weeks with nasal pain. Cardiovascular: Denies chest pain. Respiratory: Denies shortness of breath. Positive for cough and congestion 3 days. Gastrointestinal: No abdominal pain.  No nausea, no vomiting.  No diarrhea.  No constipation. Genitourinary: Negative for dysuria. Musculoskeletal: Negative for back pain. Skin: Negative for rash. Neurological: Negative for headaches, focal weakness or numbness.  10-point ROS otherwise negative.  ____________________________________________   PHYSICAL EXAM:  VITAL SIGNS: ED Triage Vitals  Enc Vitals Group     BP 06/29/15 1208 152/83 mmHg  Pulse Rate 06/29/15  1208 66     Resp 06/29/15 1208 18     Temp 06/29/15 1208 98.7 F (37.1 C)     Temp Source 06/29/15 1208 Oral     SpO2 06/29/15 1208 99 %     Weight 06/29/15 1208 208 lb (94.348 kg)     Height 06/29/15 1208 _0  (1.676 m)     Head Cir --      Peak Flow --      Pain Score 06/29/15 1208 5     Pain Loc --      Pain Edu? --      Excl. in [place]? --     Constitutional: Alert and oriented. Well appearing and in no acute distress. Head: Atraumatic. Nose: Positive rhinorrhea/congestion with swollen turbinates anterior bilaterally. Mouth/Throat: Mucous membranes are moist.  Oropharynx non-erythematous. Neck: No stridor. No cervical adenopathy noted.  Cardiovascular: Normal rate, regular rhythm. Grossly normal heart sounds.  Good peripheral circulation. Respiratory: Normal respiratory effort.  No retractions. Lungs CTAB. Decreased breath sounds noted at the right lower quadrant. Musculoskeletal: No lower extremity tenderness nor edema.  No joint effusions. Neurologic:  Normal speech and language. No gross focal neurologic deficits are appreciated. No gait instability. Skin:  Skin is warm, dry and intact. No rash noted. Psychiatric: Mood and affect are normal. Speech and behavior are normal.  ____________________________________________   LABS (all labs ordered are listed, but only abnormal results are displayed)  Labs Reviewed - No data to display  RADIOLOGY   ____________________________________________   PROCEDURES  Procedure(s) performed: None  Critical Care performed: No  ____________________________________________   INITIAL IMPRESSION / ASSESSMENT AND PLAN / ED COURSE  Pertinent labs & imaging results that were available during my care of the patient were reviewed by me and considered in my medical decision making (see chart for details).  Acute upper respiratory infection. Rx given for its max, Sudafed, Robitussin-AC and Astelin nasal spray. Patient follow up PCP or  return here with any worsening symptomology. Work excuse 2 days given. Patient voices no other emergency medical complaints at this time. ____________________________________________   FINAL CLINICAL IMPRESSION(S) / ED DIAGNOSES  Final diagnoses:  URI, acute     This chart was dictated using voice recognition software/Dragon. Despite best efforts to proofread, errors can occur which can change the meaning. Any change was purely unintentional.   Arlyss Repress, PA-C 06/29/15 [place], MD 06/29/15 [id number]

## 2015-06-29 NOTE — ED Notes (Signed)
States she developed sore throat and cough for about 3 weeks  Denies any fever

## 2015-07-16 ENCOUNTER — Ambulatory Visit: Payer: Commercial Managed Care - PPO | Admitting: Psychology

## 2015-08-28 ENCOUNTER — Other Ambulatory Visit: Payer: Self-pay | Admitting: Internal Medicine

## 2015-08-31 ENCOUNTER — Emergency Department
Admission: EM | Admit: 2015-08-31 | Discharge: 2015-08-31 | Disposition: A | Discharge status: Home / Self Care | Payer: Commercial Managed Care - PPO | Attending: Emergency Medicine | Admitting: Emergency Medicine

## 2015-08-31 ENCOUNTER — Emergency Department: Payer: Commercial Managed Care - PPO

## 2015-08-31 ENCOUNTER — Encounter: Payer: Self-pay | Admitting: Emergency Medicine

## 2015-08-31 DIAGNOSIS — E119 Type 2 diabetes mellitus without complications: Secondary | ICD-10-CM | POA: Diagnosis not present

## 2015-08-31 DIAGNOSIS — Z87891 Personal history of nicotine dependence: Secondary | ICD-10-CM | POA: Insufficient documentation

## 2015-08-31 DIAGNOSIS — R079 Chest pain, unspecified: Secondary | ICD-10-CM | POA: Insufficient documentation

## 2015-08-31 DIAGNOSIS — F329 Major depressive disorder, single episode, unspecified: Secondary | ICD-10-CM | POA: Diagnosis not present

## 2015-08-31 DIAGNOSIS — I10 Essential (primary) hypertension: Secondary | ICD-10-CM | POA: Insufficient documentation

## 2015-08-31 DIAGNOSIS — E785 Hyperlipidemia, unspecified: Secondary | ICD-10-CM | POA: Insufficient documentation

## 2015-08-31 LAB — TROPONIN I
Troponin I: <0.03 ng/mL (ref <0.031)
Troponin I: <0.03 ng/mL (ref <0.031)

## 2015-08-31 LAB — CBC
HEMATOCRIT: 40.3 % (ref 35.0–47.0)
Hemoglobin: 13.9 g/dL (ref 12.0–16.0)
MCH: 31.7 pg (ref 26.0–34.0)
MCHC: 34.5 g/dL (ref 32.0–36.0)
MCV: 91.8 fL (ref 80.0–100.0)
Platelets: 357 10*3/uL (ref 150–440)
RBC: 4.39 MIL/uL (ref 3.80–5.20)
RDW: 13.4 % (ref 11.5–14.5)
WBC: 9 10*3/uL (ref 3.6–11.0)

## 2015-08-31 LAB — BASIC METABOLIC PANEL
Anion gap: 9 (ref 5–15)
BUN: 17 mg/dL (ref 6–20)
CALCIUM: 9 mg/dL (ref 8.9–10.3)
CO2: 26 mmol/L (ref 22–32)
CREATININE: 0.9 mg/dL (ref 0.44–1.00)
Chloride: 103 mmol/L (ref 101–111)
GFR calc non Af Amer: >60 mL/min (ref >60)
Glucose, Bld: 144 mg/dL — ABNORMAL HIGH (ref 65–99)
Potassium: 3.4 mmol/L — ABNORMAL LOW (ref 3.5–5.1)
SODIUM: 138 mmol/L (ref 135–145)

## 2015-08-31 NOTE — Discharge Instructions (Signed)
Nonspecific Chest Pain  °Chest pain can be caused by many different conditions. There is always a chance that your pain could be related to something serious, such as a heart attack or a blood clot in your lungs. Chest pain can also be caused by conditions that are not life-threatening. If you have chest pain, it is very important to follow up with your health care provider. °CAUSES  °Chest pain can be caused by: °· Heartburn. °· Pneumonia or bronchitis. °· Anxiety or stress. °· Inflammation around your heart (pericarditis) or lung (pleuritis or pleurisy). °· A blood clot in your lung. °· A collapsed lung (pneumothorax). It can develop suddenly on its own (spontaneous pneumothorax) or from trauma to the chest. °· Shingles infection (varicella-zoster virus). °· Heart attack. °· Damage to the bones, muscles, and cartilage that make up your chest wall. This can include: °¨ Bruised bones due to injury. °¨ Strained muscles or cartilage due to frequent or repeated coughing or overwork. °¨ Fracture to one or more ribs. °¨ Sore cartilage due to inflammation (costochondritis). °RISK FACTORS  °Risk factors for chest pain may include: °· Activities that increase your risk for trauma or injury to your chest. °· Respiratory infections or conditions that cause frequent coughing. °· Medical conditions or overeating that can cause heartburn. °· Heart disease or family history of heart disease. °· Conditions or health behaviors that increase your risk of developing a blood clot. °· Having had chicken pox (varicella zoster). °SIGNS AND SYMPTOMS °Chest pain can feel like: °· Burning or tingling on the surface of your chest or deep in your chest. °· Crushing, pressure, aching, or squeezing pain. °· Dull or sharp pain that is worse when you move, cough, or take a deep breath. °· Pain that is also felt in your back, neck, shoulder, or arm, or pain that spreads to any of these areas. °Your chest pain may come and go, or it may stay  constant. °DIAGNOSIS °Lab tests or other studies may be needed to find the cause of your pain. Your health care provider may have you take a test called an ambulatory ECG (electrocardiogram). An ECG records your heartbeat patterns at the time the test is performed. You may also have other tests, such as: °· Transthoracic echocardiogram (TTE). During echocardiography, sound waves are used to create a picture of all of the heart structures and to look at how blood flows through your heart. °· Transesophageal echocardiogram (TEE). This is a more advanced imaging test that obtains images from inside your body. It allows your health care provider to see your heart in finer detail. °· Cardiac monitoring. This allows your health care provider to monitor your heart rate and rhythm in real time. °· Holter monitor. This is a portable device that records your heartbeat and can help to diagnose abnormal heartbeats. It allows your health care provider to track your heart activity for several days, if needed. °· Stress tests. These can be done through exercise or by taking medicine that makes your heart beat more quickly. °· Blood tests. °· Imaging tests. °TREATMENT  °Your treatment depends on what is causing your chest pain. Treatment may include: °· Medicines. These may include: °¨ Acid blockers for heartburn. °¨ Anti-inflammatory medicine. °¨ Pain medicine for inflammatory conditions. °¨ Antibiotic medicine, if an infection is present. °¨ Medicines to dissolve blood clots. °¨ Medicines to treat coronary artery disease. °· Supportive care for conditions that do not require medicines. This may include: °¨ Resting. °¨ Applying heat   or cold packs to injured areas. °¨ Limiting activities until pain decreases. °HOME CARE INSTRUCTIONS °· If you were prescribed an antibiotic medicine, finish it all even if you start to feel better. °· Avoid any activities that bring on chest pain. °· Do not use any tobacco products, including  cigarettes, chewing tobacco, or electronic cigarettes. If you need help quitting, ask your health care provider. °· Do not drink alcohol. °· Take medicines only as directed by your health care provider. °· Keep all follow-up visits as directed by your health care provider. This is important. This includes any further testing if your chest pain does not go away. °· If heartburn is the cause for your chest pain, you may be told to keep your head raised (elevated) while sleeping. This reduces the chance that acid will go from your stomach into your esophagus. °· Make lifestyle changes as directed by your health care provider. These may include: °¨ Getting regular exercise. Ask your health care provider to suggest some activities that are safe for you. °¨ Eating a heart-healthy diet. A registered dietitian can help you to learn healthy eating options. °¨ Maintaining a healthy weight. °¨ Managing diabetes, if necessary. °¨ Reducing stress. °SEEK MEDICAL CARE IF: °· Your chest pain does not go away after treatment. °· You have a rash with blisters on your chest. °· You have a fever. °SEEK IMMEDIATE MEDICAL CARE IF:  °· Your chest pain is worse. °· You have an increasing cough, or you cough up blood. °· You have severe abdominal pain. °· You have severe weakness. °· You faint. °· You have chills. °· You have sudden, unexplained chest discomfort. °· You have sudden, unexplained discomfort in your arms, back, neck, or jaw. °· You have shortness of breath at any time. °· You suddenly start to sweat, or your skin gets clammy. °· You feel nauseous or you vomit. °· You suddenly feel light-headed or dizzy. °· Your heart begins to beat quickly, or it feels like it is skipping beats. °These symptoms may represent a serious problem that is an emergency. Do not wait to see if the symptoms will go away. Get medical help right away. Call your local emergency services (911 in the [place] Do not drive yourself to the hospital. °  °This  information is not intended to replace advice given to you by your health care provider. Make sure you discuss any questions you have with your health care provider. °  °Document Released: 02/02/2005 Document Revised: 05/16/2014 Document Reviewed: 11/29/2013 °[employer]vier Interactive Patient Education ©2016 [employer] ° °

## 2015-08-31 NOTE — ED Provider Notes (Signed)
[facility] Emergency Department Provider Note     Time seen: ----------------------------------------- 6:58 PM on 08/31/2015 -----------------------------------------    I have reviewed the triage vital signs and the nursing notes.   HISTORY  Chief Complaint Chest Pain    HPI Hannah Calhoun is a 55 y.o. female who presents to ER for chest pain that radiates into her mid back. It started about 10 minutes prior to arrival. Patient states she was in an argument with her son prior to the symptoms starting. She denies a significant history of same, does have hypertension, hypercholesterolemia, diabetes. Patient states her symptoms lasted probably 5-10 minutes and resolve spontaneously.   Past Medical History  Diagnosis Date  . Arthritis   . Chicken pox   . Depression   . Diabetes mellitus without complication (HCC)   . Diverticulitis   . GERD (gastroesophageal reflux disease)   . Allergy   . Hypertension   . Hyperlipidemia   . History of colon polyps     Patient Active Problem List   Diagnosis Date Noted  . Seasonal allergies 04/28/2015  . 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 Anxiety and depression 09/01/2014  . Obesity (BMI 30-39.9) 03/11/2014  . DM2 (diabetes mellitus, type 2) (HCC) 03/11/2014  . HTN (hypertension) 03/11/2014  . HLD (hyperlipidemia) 03/11/2014  . GERD (gastroesophageal reflux disease) 03/11/2014  . Arthritis 03/11/2014    Past Surgical History  Procedure Laterality Date  . Cholecystectomy    . Breast surgery Right 1990  . Tonsillectomy    . Abdominal hysterectomy  2009  . Knee arthroscopy  2010  . Nasal septum surgery  2010  . Breast biopsy Right 1990    EXCISIONAL - NEG    Allergies Doxycycline and Nsaids  Social History Social History  Substance Use Topics  . Smoking status: Former Games developermoker  . Smokeless tobacco: Never Used     Comment: quit 2007  . Alcohol Use: No    Review of  Systems Constitutional: Negative for fever. Eyes: Negative for visual changes. ENT: Negative for sore throat. Cardiovascular: Positive for chest pain Respiratory: Negative for shortness of breath. Gastrointestinal: Negative for abdominal pain, vomiting and diarrhea. Genitourinary: Negative for dysuria. Musculoskeletal: Negative for back pain. Skin: Negative for rash. Neurological: Negative for headaches, focal weakness or numbness. Psychiatric: Positive for anxiety  10-point ROS otherwise negative.  ____________________________________________   PHYSICAL EXAM:  VITAL SIGNS: ED Triage Vitals  Enc Vitals Group     BP 08/31/15 1657 122/56 mmHg     Pulse Rate 08/31/15 1657 70     Resp 08/31/15 1657 18     Temp 08/31/15 1657 98.1 F (36.7 C)     Temp Source 08/31/15 1657 Oral     SpO2 08/31/15 1657 98 %     Weight 08/31/15 1657 209 lb (94.802 kg)     Height 08/31/15 1657 5\' 6"  (1.676 m)     Head Cir --      Peak Flow --      Pain Score 08/31/15 1655 6     Pain Loc --      Pain Edu? --      Excl. in GC? --     Constitutional: Alert and oriented. Well appearing and in no distress. Eyes: Conjunctivae are normal. PERRL. Normal extraocular movements. ENT   Head: Normocephalic and atraumatic.   Nose: No congestion/rhinnorhea.   Mouth/Throat: Mucous membranes are moist.   Neck: No stridor. Cardiovascular: Normal rate, regular rhythm. No murmurs, rubs,  or gallops. Respiratory: Normal respiratory effort without tachypnea nor retractions. Breath sounds are clear and equal bilaterally. No wheezes/rales/rhonchi. Gastrointestinal: Soft and nontender. Normal bowel sounds Musculoskeletal: Nontender with normal range of motion in all extremities. No lower extremity tenderness nor edema. Neurologic:  Normal speech and language. No gross focal neurologic deficits are appreciated.  Skin:  Skin is warm, dry and intact. No rash noted. Psychiatric: Mood and affect are normal.  Speech and behavior are normal.  ____________________________________________  EKG: Interpreted by me. Normal sinus rhythm rate 78 bpm, normal PR interval, normal QRS, normal QT interval. Normal axis. No evidence of acute infarction  ____________________________________________  ED COURSE:  Pertinent labs & imaging results that were available during my care of the patient were reviewed by me and considered in my medical decision making (see chart for details). Patient is in no acute distress, I will check cardiac labs and likely repeat troponin if the initial was negative. ____________________________________________    LABS (pertinent positives/negatives)  Labs Reviewed  BASIC METABOLIC PANEL - Abnormal; Notable for the following:    Potassium 3.4 (*)    Glucose, Bld 144 (*)    All other components within normal limits  CBC  TROPONIN I  TROPONIN I    RADIOLOGY  IMPRESSION: No active cardiopulmonary disease.  ____________________________________________  FINAL ASSESSMENT AND PLAN  Chest pain  Plan: Patient with labs and imaging as dictated above. Repeat troponin is negative as dictated above. Symptoms are likely stress related, she is advised to follow-up with cardiology for reevaluation.   Emily Filbert, MD   Emily Filbert, MD 08/31/15 [phone]

## 2015-08-31 NOTE — ED Notes (Signed)
States she developed mid chest pain which radiates into mid back  Started about 10 mins PTA.. States she was in an argument with son prior to sx's starting

## 2015-09-01 ENCOUNTER — Telehealth: Payer: Self-pay | Admitting: Cardiology

## 2015-09-01 NOTE — Telephone Encounter (Signed)
Patient called back in and stated that she has not had anymore pain like she had last night when she went to the ED. She verbalized confirmation of her upcoming appointment on 09/08/15 at 10:00AM and had no further questions at that time. Let her know that if she had any further problems or questions to please give us a call.

## 2015-09-01 NOTE — Telephone Encounter (Signed)
Left voicemail message for patient to call back.

## 2015-09-01 NOTE — Telephone Encounter (Signed)
ED fu from 08/31/15 was seen for CP Pt is coming 09/08/15 to see Dr Alvino ChapelIngal.

## 2015-09-08 ENCOUNTER — Ambulatory Visit (INDEPENDENT_AMBULATORY_CARE_PROVIDER_SITE_OTHER): Payer: Commercial Managed Care - PPO | Admitting: Cardiology

## 2015-09-08 ENCOUNTER — Encounter: Payer: Self-pay | Admitting: Cardiology

## 2015-09-08 VITALS — BP 116/78 | HR 57 | Ht 66.0 in | Wt 210.8 lb

## 2015-09-08 DIAGNOSIS — R079 Chest pain, unspecified: Secondary | ICD-10-CM

## 2015-09-08 DIAGNOSIS — I10 Essential (primary) hypertension: Secondary | ICD-10-CM | POA: Diagnosis not present

## 2015-09-08 DIAGNOSIS — E785 Hyperlipidemia, unspecified: Secondary | ICD-10-CM

## 2015-09-08 DIAGNOSIS — R001 Bradycardia, unspecified: Secondary | ICD-10-CM | POA: Diagnosis not present

## 2015-09-08 DIAGNOSIS — E669 Obesity, unspecified: Secondary | ICD-10-CM | POA: Insufficient documentation

## 2015-09-08 NOTE — Patient Instructions (Addendum)
Medication Instructions:  Your physician has recommended you make the following change in your medication:  1. STOP taking the Hydrochlorothiazide   Labwork: Your physician recommends that you return for lab work in: 2 weeks for BMP  Date & Time: ________________________________________________________   Testing/Procedures: Your physician has requested that you have an echocardiogram. Echocardiography is a painless test that uses sound waves to create images of your heart. It provides your doctor with information about the size and shape of your heart and how well your heart's chambers and valves are working. This procedure takes approximately one hour. There are no restrictions for this procedure.  Date & Time:___________________________________________________________    [facility] MYOVIEW  Your caregiver has ordered a Stress Test with nuclear imaging. The purpose of this test is to evaluate the blood supply to your heart muscle. This procedure is referred to as a "Non-Invasive Stress Test." This is because other than having an IV started in your vein, nothing is inserted or "invades" your body. Cardiac stress tests are done to find areas of poor blood flow to the heart by determining the extent of coronary artery disease (CAD). Some patients exercise on a treadmill, which naturally increases the blood flow to your heart, while others who are  unable to walk on a treadmill due to physical limitations have a pharmacologic/chemical stress agent called Lexiscan . This medicine will mimic walking on a treadmill by temporarily increasing your coronary blood flow.   Please note: these test may take anywhere between 2-4 hours to complete  PLEASE REPORT TO [facility] MEDICAL MALL ENTRANCE  THE VOLUNTEERS AT THE FIRST DESK WILL DIRECT YOU WHERE TO GO  Date of Procedure:__Wednesday Sep 16, 2015 at 07:30 AM_________  Arrival Time for Procedure:___Arrive at 07:15 AM to register_________  Instructions  regarding medication:   __X__ : Hold diabetes medication morning of procedure. Hold the Glipizide that morning.   PLEASE NOTIFY THE OFFICE AT LEAST 24 HOURS IN ADVANCE IF YOU ARE UNABLE TO KEEP YOUR APPOINTMENT.  [phone] AND  PLEASE NOTIFY NUCLEAR MEDICINE AT [facility] AT LEAST 24 HOURS IN ADVANCE IF YOU ARE UNABLE TO KEEP YOUR APPOINTMENT. [phone]  How to prepare for your Myoview test:   Do not eat or drink after midnight  No caffeine for 24 hours prior to test  No smoking 24 hours prior to test.  Your medication may be taken with water.  If your doctor stopped a medication because of this test, do not take that medication.  Ladies, please do not wear dresses.  Skirts or pants are appropriate. Please wear a short sleeve shirt.  No perfume, cologne or lotion.  Wear comfortable walking shoes. No heels!       Follow-Up: Your physician recommends that you schedule a follow-up appointment after testing with Dr. Alvino Chapel to review results.  Date & Time: _______________________________________________________________   Any Other Special Instructions Will Be Listed Below (If Applicable).     If you need a refill on your cardiac medications before your next appointment, please call your pharmacy.  Echocardiogram An echocardiogram, or echocardiography, uses sound waves (ultrasound) to produce an image of your heart. The echocardiogram is simple, painless, obtained within a short period of time, and offers valuable information to your health care provider. The images from an echocardiogram can provide information such as:  Evidence of coronary artery disease (CAD).  Heart size.  Heart muscle function.  Heart valve function.  Aneurysm detection.  Evidence of a past heart attack.  Fluid buildup around  the heart.  Heart muscle thickening.  Assess heart valve function. LET [facility] CARE PROVIDER KNOW ABOUT:  Any allergies you have.  All medicines you are  taking, including vitamins, herbs, eye drops, creams, and over-the-counter medicines.  Previous problems you or members of your family have had with the use of anesthetics.  Any blood disorders you have.  Previous surgeries you have had.  Medical conditions you have.  Possibility of pregnancy, if this applies. BEFORE THE PROCEDURE  No special preparation is needed. Eat and drink normally.  PROCEDURE   In order to produce an image of your heart, gel will be applied to your chest and a wand-like tool (transducer) will be moved over your chest. The gel will help transmit the sound waves from the transducer. The sound waves will harmlessly bounce off your heart to allow the heart images to be captured in real-time motion. These images will then be recorded.  You may need an IV to receive a medicine that improves the quality of the pictures. AFTER THE PROCEDURE You may return to your normal schedule including diet, activities, and medicines, unless your health care provider tells you otherwise.   This information is not intended to replace advice given to you by your health care provider. Make sure you discuss any questions you have with your health care provider.   Document Released: 04/22/2000 Document Revised: 05/16/2014 Document Reviewed: 12/31/2012 Elsevier Interactive Patient Education 2016 Elsevier Inc.   Cardiac Nuclear Scanning A cardiac nuclear scan is used to check your heart for problems, such as the following:  A portion of the heart is not getting enough blood.  Part of the heart muscle has died, which happens with a heart attack.  The heart wall is not working normally.  In this test, a radioactive dye (tracer) is injected into your bloodstream. After the tracer has traveled to your heart, a scanning device is used to measure how much of the tracer is absorbed by or distributed to various areas of your heart. LET [facility] CARE PROVIDER KNOW ABOUT:  Any allergies  you have.  All medicines you are taking, including vitamins, herbs, eye drops, creams, and over-the-counter medicines.  Previous problems you or members of your family have had with the use of anesthetics.  Any blood disorders you have.  Previous surgeries you have had.  Medical conditions you have.  RISKS AND COMPLICATIONS Generally, this is a safe procedure. However, as with any procedure, problems can occur. Possible problems include:   Serious chest pain.  Rapid heartbeat.  Sensation of warmth in your chest. This usually passes quickly. BEFORE THE PROCEDURE Ask your health care provider about changing or stopping your regular medicines. PROCEDURE This procedure is usually done at a hospital and takes 2-4 hours.  An IV tube is inserted into one of your veins.  Your health care provider will inject a small amount of radioactive tracer through the tube.  You will then wait for 20-40 minutes while the tracer travels through your bloodstream.  You will lie down on an exam table so images of your heart can be taken. Images will be taken for about 15-20 minutes.  You will exercise on a treadmill or stationary bike. While you exercise, your heart activity will be monitored with an electrocardiogram (ECG), and your blood pressure will be checked.  If you are unable to exercise, you may be given a medicine to make your heart beat faster.  When blood flow to your heart has  peaked, tracer will again be injected through the IV tube.  After 20-40 minutes, you will get back on the exam table and have more images taken of your heart.  When the procedure is over, your IV tube will be removed. AFTER THE PROCEDURE  You will likely be able to leave shortly after the test. Unless your health care provider tells you otherwise, you may return to your normal schedule, including diet, activities, and medicines.  Make sure you find out how and when you will get your test results.   This  information is not intended to replace advice given to you by your health care provider. Make sure you discuss any questions you have with your health care provider.   Document Released: 05/20/2004 Document Revised: 04/30/2013 Document Reviewed: 04/03/2013 Elsevier Interactive Patient Education 2016 [employer].   Basic Metabolic Panel WHY AM I HAVING THIS TEST? A basic metabolic panel measures levels of the following substances in your blood:   Glucose. Glucose is a simple sugar that serves as the main source of energy for your body.  Creatinine. Creatinine is a waste product of normal muscle activity. It is excreted from the body by the kidneys.  Blood urea nitrogen (BUN). Urea nitrogen is a waste product of protein breakdown. It is produced when excess protein in your body is broken down and used for energy. It is excreted by the kidneys.  Electrolytes. Electrolytes are negatively or positively charged particles that are dissolved in the water of different body compartments. This includes the serum portion of blood, water inside cells, and water outside cells. Concentrations of electrolytes vary among the different fluid compartments. Electrolytes are tightly regulated to maintain a salt-water and acid-base balance in the body. The electrolytes measured in a basic metabolic panel include:  Potassium.  Sodium.  Chloride.  Calcium.  Bicarbonate. WHAT KIND OF SAMPLE IS TAKEN? A blood sample is required for this test. It is usually collected by inserting a needle into a vein. HOW DO I PREPARE FOR THE TEST? Your health care provider may ask you to avoid eating or drinking anything before your blood sample is taken. Do not eat or drink anything after midnight on the night before the procedure or as directed by your health care provider. WHAT ARE THE REFERENCE RANGES? Reference ranges are considered healthy ranges established after testing a large group of healthy people. Reference  ranges may vary among different people, labs, and hospitals. It is your responsibility to obtain your test results. Ask the lab or department performing the test when and how you will get your results. The following are reference ranges for each part of a basic metabolic panel: Glucose  Cord: 45-96 mg/dL or 1.6-1.0 mmol/L (SI units).  Premature infant: 20-60 mg/dL or 9.6-0.4 mmol/L.  Neonate: 30-60 mg/dL or 5.4-0.9 mmol/L.  Infant: 40-90 mg/dL or 8.1-1 mmol/L.  Child under 77 years old: 60-100 mg/dL or 9.1-4.7 mmol/L.  Adult or child over 42 years old:  Fasting: 70-110 mg/dL or less than 6.1 mmol/L.  Random (nonfasting or casual): less than or equal to 200 mg/dL or less than 82.9 mmol/L.  Elderly: increase in normal range after age 26 years. Creatinine  Child under 78 years old: 0.1-0.4 mg/dL.  Child 18-80 years old: 0.2-0.5 mg/dL.  Child 29-43 years old: 0.3-0.6 mg/dL.  Child or adolescent 19-62 years old: 0.4-1 mg/dL.  Adult 44-85 years old:  Female: 0.5-1 mg/dL.  Female: 0.6-1.2 mg/dL.  Adult 60-94 years old:  Female: 0.5-1.1 mg/dL.  Female: 0.6-1.3 mg/dL.  Adult 40 years old and above:  Female: 0.5-1.2 mg/dL.  Female: 0.7-1.3 mg/dL. BUN  Cord: 21-40 mg/dL.  Newborn: 3-12 mg/dL.  Infant: 5-18 mg/dL.  Child: 5-18 mg/dL.  Adult: 10-20 mg/dL or 6.5-7.8 mmol/L (SI units).  Elderly: may be slightly higher than adult. Potassium  Newborn: 3.9-5.9 mEq/L.  Infant: 4.1-5.3 mEq/L.  Child: 3.4-4.7 mEq/L.  Adult or elderly: 3.5-5 mEq/L or 3.5-5 mmol/L (SI units). Sodium  Newborn: 134-144 mEq/L.  Infant: 134-150 mEq/L.  Child: 136-145 mEq/L.  Adult or elderly: 136-145 mEq/L or 136-145 mmol/L (SI units). Chloride  Premature infant: 95-110 mEq/L.  Newborn: 96-106 mEq/L.  Child: 90-110 mEq/L.  Adult or elderly: 98-106 mEq/L or 98-106 mmol/L (SI units). Calcium  Total calcium:  Newborn under 55 days old: 7.6-10.4 mg/dL or 4.6-9.6 mmol/L.  Umbilical:  9-11.5 mg/dL or 2.95-2.84 mmol/L.  79 days to 53 years old: 9-10.6 mg/dL or 1.32-4.40 mmol/L.  Child: 8.8-10.8 mg/dL or 1.0-2.7 mmol/L.  Adult: 9-10.5 mg/dL or 2.53-6.64 mmol/L.  Ionized calcium:  Newborn: 4.20-5.58 mg/dL or 4.03-4.74 mmol/L.  2 months to 53 years old: 4.80-5.52 mg/dL or 2.59-5.63 mmol/L.  Adult: 4.5-5.6 mg/dL or 8.75-6.43 mmol/L. Bicarbonate  Newborn: 13-22 mEq/L.  Infant: 20-28 mEq/L.  Child: 20-28 mEq/L.  Adult or elderly: 23-30 mEq/L or 23-30 mmol/L (SI units). WHAT DO THE RESULTS MEAN? Diet and levels of activity can have an effect on your test results. Sometimes they can be the cause of values that are outside of normal limits. However, sometimes values outside normal limits can indicate a medical disorder.  Glucose:  Abnormally high glucose levels (hyperglycemia) are usually associated with prediabetes mellitus and diabetes mellitus. They can also occur with severe stress on the body. This can come from surgery or events such as stroke or trauma. Overactive thyroid gland and pancreatitis or pancreatic cancer can also cause abnormally high glucose levels.  Abnormally low glucose levels (hypoglycemia) can occur with underactive thyroid gland and rare insulin-secreting tumors (insulinoma).  Creatinine:  Abnormally high creatinine levels are most commonly seen in kidney failure. They can also be seen with overactive thyroid (hyperthyroidism), conditions related to overgrowth of the body, abnormal breakdown of muscle tissue, and early muscular dystrophy.  Abnormally low creatinine levels can indicate low muscle mass associated with malnutrition or late-stage muscular dystrophy.  BUN:  Abnormally high BUN levels generally mean that your kidneys are not functioning normally.  Abnormally low BUN levels can be seen with malnutrition and liver failure.  Potassium:  Abnormally high potassium levels are most often seen with kidney disease, massive destruction  of red blood cells, and adrenal gland failure.  Abnormally low potassium levels are seen with excessive levels of the hormone aldosterone.  Sodium:  Abnormally high sodium levels can be seen with dehydration, excessive thirst, and urination due to abnormally low levels of antidiuretic hormone (diabetes insipidus). They can also be seen with excessive levels of aldosterone or cortisol in the body.  Abnormally low levels of sodium can be seen with congestive heart failure, cirrhosis of the liver, kidney failure, and the syndrome of inappropriate antidiuretic hormone (SIADH). Chloride:  Abnormally high levels of chloride can be seen with acute kidney failure, diabetes insipidus, prolonged diarrhea, and poisoning with aspirin or bromide.  Abnormally low levels of chloride can be seen with prolonged vomiting, acute adrenal gland failure, and SIADH.  Calcium:  Abnormally high levels of calcium can occur with excessive activity of the parathyroid glands, certain cancers, and a type of inflammation seen  in sarcoidosis and tuberculosis.  Abnormally low levels of calcium can be seen with underactive parathyroid glands, vitamin D deficiency, and acute pancreatitis.  Bicarbonate:  Abnormally high bicarbonate levels are seen after prolonged vomiting and diuretic therapy, which lead to a decrease in the amount of acid in the body (metabolic alkalosis). They can also be seen in conditions that increase the amount of bicarbonate in the body. These conditions include rare hereditary disorders that interfere with how your kidneys handle electrolytes.  Abnormally low bicarbonate levels are seen with conditions that cause your body to produce too much acid (metabolic acidosis). These conditions include uncontrolled diabetes mellitus and poisoning with aspirin, methanol, or antifreeze (ethylene glycol). Talk with your health care provider to discuss your results, treatment options, and if necessary, the need  for more tests. Talk with your health care provider if you have any questions about your results.   This information is not intended to replace advice given to you by your health care provider. Make sure you discuss any questions you have with your health care provider.   Document Released: 05/18/2004 Document Revised: 05/16/2014 Document Reviewed: 09/23/2013 Elsevier Interactive Patient Education [employer]2016 Elsevier Inc.

## 2015-09-08 NOTE — Progress Notes (Signed)
Cardiology Office Note   Date:  09/08/2015   ID:  Hannah Calhoun, DOB [birth year], MRN [id number]  Referring Doctor:  Lorrin Mais, MD   Cardiologist:   Wende Bushy, MD   Reason for consultation:  Chief Complaint  Patient presents with  . other    C/o chest pain . Meds reviewed verbally with pt.      History of Present Illness: Hannah Calhoun is a 55 y.o. female who presents for Chest pain. She presented with chest pain one week ago. It was during the time that she was very upset with this situation that her son was in. She describes the pain as sharp like, stabbing in nature. Severe intensity., Nonradiating. No associated shortness of breath. No loss of consciousness. Symptoms lasted for 20 minutes. No recurrence since then.  Patient denies headache, cough, colds, abdominal pain, PND, orthopnea, edema. She denies shortness of breath with exertion.  ROS:  Please see the history of present illness. Aside from mentioned under HPI, all other systems are reviewed and negative.     Past Medical History  Diagnosis Date  . Arthritis   . Chicken pox   . Depression   . Diabetes mellitus without complication ([place])   . Diverticulitis   . GERD (gastroesophageal reflux disease)   . Allergy   . Hypertension   . Hyperlipidemia   . History of colon polyps     Past Surgical History  Procedure Laterality Date  . Cholecystectomy    . Breast surgery Right 1990  . Tonsillectomy    . Abdominal hysterectomy  2009  . Knee arthroscopy  2010  . Nasal septum surgery  2010  . Breast biopsy Right 1990    EXCISIONAL - NEG     reports that she has quit smoking. She has never used smokeless tobacco. She reports that she drinks alcohol. She reports that she does not use illicit drugs.   family history includes Alcohol abuse in her father, maternal aunt, maternal grandfather, maternal uncle, mother, paternal aunt, and paternal uncle; Birth defects in her paternal aunt;  Breast cancer in her paternal aunt; Heart attack in her father and mother; Heart disease in her father, maternal grandfather, mother, paternal aunt, and paternal uncle; Hyperlipidemia in her father and mother; Hypertension in her father, maternal aunt, maternal grandfather, maternal grandmother, maternal uncle, and mother; Kidney disease in her maternal aunt; Stroke in her maternal grandmother.   Current Outpatient Prescriptions  Medication Sig Dispense Refill  . albuterol (PROVENTIL HFA;VENTOLIN HFA) 108 (90 BASE) MCG/ACT inhaler Inhale 2 puffs into the lungs every 6 (six) hours as needed for wheezing or shortness of breath. 1 Inhaler 2  . aspirin 81 MG tablet Take 81 mg by mouth daily.    Marland Kitchen azelastine (ASTELIN) 0.1 % nasal spray Place 2 sprays into both nostrils 2 (two) times daily. Use in each nostril as directed 30 mL 2  . Blood Glucose Monitoring Suppl (ONE TOUCH ULTRA MINI) W/DEVICE KIT     . celecoxib (CELEBREX) 200 MG capsule Take 200 mg by mouth 2 (two) times daily.    . cetirizine (ZYRTEC) 10 MG tablet Take 10 mg by mouth daily. Reported on 04/28/2015    . citalopram (CELEXA) 40 MG tablet TAKE 1 TABLET (40 MG TOTAL) BY MOUTH DAILY. 30 tablet 8  . gabapentin (NEURONTIN) 300 MG capsule Take 300 mg by mouth 2 (two) times daily.    Marland Kitchen glipiZIDE (GLUCOTROL) 5 MG tablet TAKE 1 TABLET (5  MG TOTAL) BY MOUTH 2 (TWO) TIMES DAILY BEFORE A MEAL. 60 tablet 11  . glucose blood test strip 1 each by Other route 3 (three) times daily. 100 each 11  . lisinopril (PRINIVIL,ZESTRIL) 10 MG tablet Take 10 mg by mouth daily.    . Olopatadine HCl (PAZEO) 0.7 % SOLN Apply to eye.    . Omega-3 Fatty Acids (FISH OIL) 300 MG CAPS Take 1 capsule by mouth 2 (two) times daily.    Glory Rosebush DELICA LANCETS 19E MISC     . pantoprazole (PROTONIX) 40 MG tablet Take 1 tablet by mouth daily.    . simvastatin (ZOCOR) 10 MG tablet TAKE 1 TABLET BY MOUTH AT BEDTIME 30 tablet 11   No current facility-administered medications  for this visit.    Allergies: Doxycycline; Nsaids; and Tolmetin    PHYSICAL EXAM: VS:  BP 116/78 mmHg  Pulse 57  Ht 5' 6" (1.676 m)  Wt 210 lb 12 oz (95.596 kg)  BMI 34.03 kg/m2  LMP  (LMP Unknown) , Body mass index is 34.03 kg/(m^2). Wt Readings from Last 3 Encounters:  09/08/15 210 lb 12 oz (95.596 kg)  08/31/15 209 lb (94.802 kg)  06/29/15 208 lb (94.348 kg)    GENERAL:  well developed, well nourished,  obese, not in acute distress HEENT: normocephalic, pink conjunctivae, anicteric sclerae, no xanthelasma, normal dentition, oropharynx clear NECK:  no neck vein engorgement, JVP normal, no hepatojugular reflux, carotid upstroke brisk and symmetric, no bruit, no thyromegaly, no lymphadenopathy LUNGS:  good respiratory effort, clear to auscultation bilaterally CV:  PMI not displaced, no thrills, no lifts, S1 and S2 within normal limits, no palpable S3 or S4, no murmurs, no rubs, no gallops ABD:  Soft, nontender, nondistended, normoactive bowel sounds, no abdominal aortic bruit, no hepatomegaly, no splenomegaly MS: nontender back, no kyphosis, no scoliosis, no joint deformities EXT:  2+ DP/PT pulses, no edema, no varicosities, no cyanosis, no clubbing SKIN: warm, nondiaphoretic, normal turgor, no ulcers NEUROPSYCH: alert, oriented to person, place, and time, sensory/motor grossly intact, normal mood, appropriate affect  Recent Labs: 04/28/2015: ALT 18 08/31/2015: BUN 17; Creatinine, Ser 0.90; Hemoglobin 13.9; Platelets 357; Potassium 3.4*; Sodium 138   Lipid Panel    Component Value Date/Time   CHOL 170 04/28/2015 1346   TRIG 99.0 04/28/2015 1346   HDL 47.60 04/28/2015 1346   CHOLHDL 4 04/28/2015 1346   VLDL 19.8 04/28/2015 1346   LDLCALC 103* 04/28/2015 1346     Other studies Reviewed:  EKG:  The ekg from Sep 08, 2015 was personally reviewed by me and it revealed sinus bradycardia 57 BPM  Additional studies/ records that were reviewed personally reviewed by me today  include: None available   ASSESSMENT AND PLAN: Chest pain Patient has risk factors for coronary artery disease including diabetes, hypertension, hyperlipidemia. Recommend further evaluation with exercise nuclear stress test. Recommend echo.  Sinus bradycardia Patient reports that she has noted her heart rate to be in the 50s occasionally when she checks her blood pressure. No associated lightheadedness or dizziness or loss of consciousness. Continue to monitor blood pressure and heart rate. We will be able to determine chronotropic competence on an exercise stress test.  Hypertension Blood pressure was within normal limits. In light of of mild hypokalemia on recent blood work, recommend to discontinue HCTZ altogether. Recommend repeat BMP in 1 week.  Hyperlipidemia LDL goal is less than 70 due to history of diabetes. PCP following labs.  Obesity Body mass index is 34.03  kg/(m^2).Marland Kitchen Recommend aggressive weight loss through diet and increased physical activity once stress test has been done and evaluated.   Current medicines are reviewed at length with the patient today.  The patient does not have concerns regarding medicines.  Labs/ tests ordered today include:  Orders Placed This Encounter  Procedures  . NM Myocar Multi W/Spect W/Wall Motion / EF  . Basic metabolic panel  . EKG 12-Lead  . Echocardiogram    I had a lengthy and detailed discussion with the patient regarding diagnoses, prognosis, diagnostic options, treatment options , and side effects of medications.   I counseled the patient on importance of lifestyle modification including heart healthy diet, regular physical activity .   Disposition:   FU with undersigned after tests    Signed, Wende Bushy, MD  09/08/2015 10:49 AM    [place]

## 2015-09-09 ENCOUNTER — Encounter: Payer: Self-pay | Admitting: Cardiology

## 2015-09-15 ENCOUNTER — Telehealth: Payer: Self-pay | Admitting: Cardiology

## 2015-09-15 NOTE — Telephone Encounter (Signed)
Spoke with patient and verified her stress test for tomorrow. She verbalized understanding of all instructions and had no further questions at this time.

## 2015-09-16 ENCOUNTER — Encounter: Payer: Self-pay | Admitting: Pain Medicine

## 2015-09-16 ENCOUNTER — Ambulatory Visit
Admission: RE | Admit: 2015-09-16 | Discharge: 2015-09-16 | Disposition: A | Discharge status: Home / Self Care | Payer: Commercial Managed Care - PPO | Source: Ambulatory Visit | Attending: Cardiology | Admitting: Cardiology

## 2015-09-16 DIAGNOSIS — R079 Chest pain, unspecified: Secondary | ICD-10-CM | POA: Insufficient documentation

## 2015-09-16 MED ORDER — TECHNETIUM TC 99M SESTAMIBI - CARDIOLITE
33.0000 | Freq: Once | INTRAVENOUS | Status: AC | PRN
  Administered 2015-09-16: 09:00:00 28.796 via INTRAVENOUS

## 2015-09-16 MED ORDER — TECHNETIUM TC 99M SESTAMIBI - CARDIOLITE
13.0000 | Freq: Once | INTRAVENOUS | Status: AC | PRN
  Administered 2015-09-16: 08:00:00 13.757 via INTRAVENOUS

## 2015-09-17 ENCOUNTER — Other Ambulatory Visit: Payer: Self-pay

## 2015-09-17 ENCOUNTER — Ambulatory Visit (INDEPENDENT_AMBULATORY_CARE_PROVIDER_SITE_OTHER): Payer: Commercial Managed Care - PPO

## 2015-09-17 DIAGNOSIS — R079 Chest pain, unspecified: Secondary | ICD-10-CM

## 2015-09-18 LAB — NM MYOCAR MULTI W/SPECT W/WALL MOTION / EF
CHL CUP NUCLEAR SRS: 2
CHL CUP RESTING HR STRESS: 54 {beats}/min
CHL CUP STRESS STAGE 1 GRADE: 0 %
CHL CUP STRESS STAGE 1 SPEED: 0 mph
CHL CUP STRESS STAGE 2 GRADE: 0.1 %
CHL CUP STRESS STAGE 2 SPEED: 0 mph
CHL CUP STRESS STAGE 3 GRADE: 10 %
CHL CUP STRESS STAGE 3 HR: 101 {beats}/min
CHL CUP STRESS STAGE 3 SBP: 156 mmHg
CHL CUP STRESS STAGE 4 GRADE: 12 %
CHL CUP STRESS STAGE 5 GRADE: 14 %
CHL CUP STRESS STAGE 5 HR: 151 {beats}/min
CHL CUP STRESS STAGE 6 HR: 127 {beats}/min
CHL CUP STRESS STAGE 7 SPEED: 0 mph
CSEPED: 7 min
CSEPEDS: 59 s
CSEPEW: 10 METS
CSEPHR: 89 %
CSEPPHR: 151 {beats}/min
LV dias vol: 90 mL (ref 46–106)
LVSYSVOL: 35 mL
NUC STRESS TID: 1.66
Percent of predicted max HR: 89 %
SDS: 0
SSS: 1
Stage 1 HR: 51 {beats}/min
Stage 2 HR: 51 {beats}/min
Stage 3 DBP: 50 mmHg
Stage 3 Speed: 1.7 mph
Stage 4 HR: 114 {beats}/min
Stage 4 Speed: 2.5 mph
Stage 5 Speed: 3.4 mph
Stage 6 Grade: 0 %
Stage 6 Speed: 0 mph
Stage 7 DBP: 49 mmHg
Stage 7 Grade: 0 %
Stage 7 HR: 80 {beats}/min
Stage 7 SBP: 126 mmHg

## 2015-09-22 ENCOUNTER — Other Ambulatory Visit (INDEPENDENT_AMBULATORY_CARE_PROVIDER_SITE_OTHER): Payer: Commercial Managed Care - PPO | Admitting: *Deleted

## 2015-09-22 DIAGNOSIS — R079 Chest pain, unspecified: Secondary | ICD-10-CM

## 2015-09-23 ENCOUNTER — Encounter: Payer: Self-pay | Admitting: Cardiology

## 2015-09-23 ENCOUNTER — Ambulatory Visit (INDEPENDENT_AMBULATORY_CARE_PROVIDER_SITE_OTHER): Payer: Commercial Managed Care - PPO | Admitting: Cardiology

## 2015-09-23 VITALS — BP 110/64 | HR 65 | Ht 66.0 in | Wt 210.8 lb

## 2015-09-23 DIAGNOSIS — I10 Essential (primary) hypertension: Secondary | ICD-10-CM | POA: Diagnosis not present

## 2015-09-23 DIAGNOSIS — R002 Palpitations: Secondary | ICD-10-CM

## 2015-09-23 DIAGNOSIS — R001 Bradycardia, unspecified: Secondary | ICD-10-CM

## 2015-09-23 LAB — BASIC METABOLIC PANEL
BUN/Creatinine Ratio: 13 (ref 9–23)
BUN: 13 mg/dL (ref 6–24)
CALCIUM: 8.9 mg/dL (ref 8.7–10.2)
CO2: 22 mmol/L (ref 18–29)
CREATININE: 0.99 mg/dL (ref 0.57–1.00)
Chloride: 103 mmol/L (ref 96–106)
GFR, EST AFRICAN AMERICAN: 76 mL/min/{1.73_m2} (ref >59)
GFR, EST NON AFRICAN AMERICAN: 66 mL/min/{1.73_m2} (ref >59)
Glucose: 93 mg/dL (ref 65–99)
Potassium: 4.3 mmol/L (ref 3.5–5.2)
Sodium: 141 mmol/L (ref 134–144)

## 2015-09-23 NOTE — Progress Notes (Signed)
Cardiology Office Note   Date:  09/23/2015   ID:  Hannah Calhoun, DOB 1961-01-22, MRN [id number]  Referring Doctor:  Lorrin Mais, MD   Cardiologist:   Wende Bushy, MD   Reason for consultation:  Chief Complaint  Patient presents with  . other    Follow up from Echo and labs. Meds reviewed by the patient verbally. Pt. c/o palpitations.       History of Present Illness: Hannah Calhoun is a 55 y.o. female who presents for Follow-up after tests.  Patient denies recurrence of chest pain. She does have some palpitations she describes them as fluttering in her chest, nonradiating. Brief in duration, daily in occurrence. No known triggers.  Patient denies headache, cough, colds, abdominal pain, PND, orthopnea, edema. She denies shortness of breath with exertion.  ROS:  Please see the history of present illness. Aside from mentioned under HPI, all other systems are reviewed and negative.     Past Medical History  Diagnosis Date  . Arthritis   . Chicken pox   . Depression   . Diabetes mellitus without complication ([place])   . Diverticulitis   . GERD (gastroesophageal reflux disease)   . Allergy   . Hypertension   . Hyperlipidemia   . History of colon polyps     Past Surgical History  Procedure Laterality Date  . Cholecystectomy    . Breast surgery Right 1990  . Tonsillectomy    . Abdominal hysterectomy  2009  . Knee arthroscopy  2010  . Nasal septum surgery  2010  . Breast biopsy Right 1990    EXCISIONAL - NEG     reports that she quit smoking about 10 years ago. She has never used smokeless tobacco. She reports that she drinks alcohol. She reports that she does not use illicit drugs.   family history includes Alcohol abuse in her father, maternal aunt, maternal grandfather, maternal uncle, mother, paternal aunt, and paternal uncle; Birth defects in her paternal aunt; Breast cancer in her paternal aunt; Heart attack in her father and mother; Heart  disease in her father, maternal grandfather, mother, paternal aunt, and paternal uncle; Hyperlipidemia in her father and mother; Hypertension in her father, maternal aunt, maternal grandfather, maternal grandmother, maternal uncle, and mother; Kidney disease in her maternal aunt; Stroke in her maternal grandmother.   Current Outpatient Prescriptions  Medication Sig Dispense Refill  . albuterol (PROVENTIL HFA;VENTOLIN HFA) 108 (90 BASE) MCG/ACT inhaler Inhale 2 puffs into the lungs every 6 (six) hours as needed for wheezing or shortness of breath. 1 Inhaler 2  . azelastine (ASTELIN) 0.1 % nasal spray Place 2 sprays into both nostrils 2 (two) times daily. Use in each nostril as directed 30 mL 2  . Blood Glucose Monitoring Suppl (ONE TOUCH ULTRA MINI) W/DEVICE KIT     . cetirizine (ZYRTEC) 10 MG tablet Take 10 mg by mouth daily. Reported on 04/28/2015    . citalopram (CELEXA) 40 MG tablet TAKE 1 TABLET (40 MG TOTAL) BY MOUTH DAILY. 30 tablet 8  . gabapentin (NEURONTIN) 300 MG capsule Take 300 mg by mouth 2 (two) times daily.    Marland Kitchen glipiZIDE (GLUCOTROL) 5 MG tablet TAKE 1 TABLET (5 MG TOTAL) BY MOUTH 2 (TWO) TIMES DAILY BEFORE A MEAL. 60 tablet 11  . glucose blood test strip 1 each by Other route 3 (three) times daily. 100 each 11  . lisinopril (PRINIVIL,ZESTRIL) 10 MG tablet Take 10 mg by mouth daily.    Marland Kitchen  Omega-3 Fatty Acids (FISH OIL) 300 MG CAPS Take 1 capsule by mouth 2 (two) times daily.    Glory Rosebush DELICA LANCETS 15V MISC     . pantoprazole (PROTONIX) 40 MG tablet Take 1 tablet by mouth daily.    . simvastatin (ZOCOR) 10 MG tablet TAKE 1 TABLET BY MOUTH AT BEDTIME 30 tablet 11  . aspirin 81 MG tablet Take 81 mg by mouth daily. Reported on 09/23/2015     No current facility-administered medications for this visit.    Allergies: Doxycycline; Nsaids; and Tolmetin    PHYSICAL EXAM: VS:  BP 110/64 mmHg  Pulse 65  Ht _0  (1.676 m)  Wt 210 lb 12 oz (95.596 kg)  BMI 34.03 kg/m2  LMP  (LMP  Unknown) , Body mass index is 34.03 kg/(m^2). Wt Readings from Last 3 Encounters:  09/23/15 210 lb 12 oz (95.596 kg)  09/08/15 210 lb 12 oz (95.596 kg)  08/31/15 209 lb (94.802 kg)    GENERAL:  well developed, well nourished,  obese, not in acute distress HEENT: normocephalic, pink conjunctivae, anicteric sclerae, no xanthelasma, normal dentition, oropharynx clear NECK:  no neck vein engorgement, JVP normal, no hepatojugular reflux, carotid upstroke brisk and symmetric, no bruit, no thyromegaly, no lymphadenopathy LUNGS:  good respiratory effort, clear to auscultation bilaterally CV:  PMI not displaced, no thrills, no lifts, S1 and S2 within normal limits, no palpable S3 or S4, no murmurs, no rubs, no gallops ABD:  Soft, nontender, nondistended, normoactive bowel sounds, no abdominal aortic bruit, no hepatomegaly, no splenomegaly MS: nontender back, no kyphosis, no scoliosis, no joint deformities EXT:  2+ DP/PT pulses, no edema, no varicosities, no cyanosis, no clubbing SKIN: warm, nondiaphoretic, normal turgor, no ulcers NEUROPSYCH: alert, oriented to person, place, and time, sensory/motor grossly intact, normal mood, appropriate affect  Recent Labs: 04/28/2015: ALT 18 08/31/2015: Hemoglobin 13.9; Platelets 357 09/22/2015: BUN 13; Creatinine, Ser 0.99; Potassium 4.3; Sodium 141   Lipid Panel    Component Value Date/Time   CHOL 170 04/28/2015 1346   TRIG 99.0 04/28/2015 1346   HDL 47.60 04/28/2015 1346   CHOLHDL 4 04/28/2015 1346   VLDL 19.8 04/28/2015 1346   LDLCALC 103* 04/28/2015 1346     Other studies Reviewed:  EKG:  The ekg from Sep 08, 2015 was personally reviewed by me and it revealed sinus bradycardia 57 BPM  Additional studies/ records that were reviewed personally reviewed by me today include:  Echo 09/17/2015: Left ventricle: The cavity size was normal. Wall thickness was  normal. Systolic function was normal. The estimated ejection  fraction was in the range of  60% to 65%. Wall motion was normal;  there were no regional wall motion abnormalities. Left  ventricular diastolic function parameters were normal.  Impressions:  - Normal study.  Exercise nuclear stress test, 09/16/2015: Exercise myocardial perfusion imaging study with no significant ischemia Normal wall motion, EF estimated at 54% Gi uptake artifact noted No EKG changes concerning for ischemia at peak stress or in recovery. T wave inversion was noted during stress in the III and aVF leads, beginning at 11 minutes of stress. T wave inversion persisted.  Blood pressure demonstrated a hypertensive response to exercise.  Low risk scan Heart rate peak 151 BPM  ASSESSMENT AND PLAN: Chest pain Patient has risk factors for coronary artery disease including diabetes, hypertension, hyperlipidemia. Exercise nuclear stress test was negative for ischemia. Discussed findings with patient. This pretends low likelihood of clinically significant CAD. Risk factor modification recommended.  Sinus bradycardia Patient reports that she has noted her heart rate to be in the 50s occasionally when she checks her blood pressure. No associated lightheadedness or dizziness or loss of consciousness. Continue to monitor blood pressure and heart rate. Heart rate did go up to 151 BPM, and exercise stress is, consistent with chronotropic competence.  Palpitations Recommend 24-hour Holter monitor. Patient would like to think about this first. She is not truly bothered by her symptoms.  Hypertension Blood pressure was within normal limits. In light of of mild hypokalemia on recent blood work, discontinued last time HCTZ altogether. Repeat BMP showed normal potassium.  Hyperlipidemia LDL goal is less than 70 due to history of diabetes. PCP following labs.  Obesity Body mass index is 34.03 kg/(m^2).Marland Kitchen Recommend aggressive weight loss through diet and increased physical activity once stress test has been  done and evaluated.   Current medicines are reviewed at length with the patient today.  The patient does not have concerns regarding medicines.  Labs/ tests ordered today include:  Orders Placed This Encounter  Procedures  . EKG 12-Lead    I had a lengthy and detailed discussion with the patient regarding diagnoses, prognosis, diagnostic options, treatment options , and side effects of medications.   I counseled the patient on importance of lifestyle modification including heart healthy diet, regular physical activity .   Disposition:   FU with undersigned If she would like to proceed with palpitation workup    Signed, Wende Bushy, MD  09/23/2015 1:30 PM    [place]

## 2015-09-23 NOTE — Patient Instructions (Signed)
Medication Instructions:  Your physician recommends that you continue on your current medications as directed. Please refer to the Current Medication list given to you today.  Labwork: None ordered.  Testing/Procedures: None ordered.  Follow-Up: Your physician recommends that you schedule a follow-up appointment as needed.   Any Other Special Instructions Will Be Listed Below (If Applicable).     If you need a refill on your cardiac medications before your next appointment, please call your pharmacy.   

## 2015-10-01 ENCOUNTER — Telehealth: Payer: Self-pay | Admitting: *Deleted

## 2015-10-01 NOTE — Telephone Encounter (Signed)
sw pt made her aware that we are closed on 10/05/15. Gave appt for 10/26/2015 @ 2pm. Pt is aware.Hannah Calhoun.Hannah Calhoun.TD

## 2015-10-05 ENCOUNTER — Ambulatory Visit: Payer: Self-pay | Admitting: Pain Medicine

## 2015-10-26 ENCOUNTER — Ambulatory Visit: Payer: Self-pay | Admitting: Pain Medicine

## 2015-12-07 ENCOUNTER — Other Ambulatory Visit: Payer: Self-pay | Admitting: Internal Medicine

## 2016-01-21 ENCOUNTER — Encounter: Payer: Self-pay | Admitting: Internal Medicine

## 2016-02-03 ENCOUNTER — Other Ambulatory Visit: Payer: Self-pay | Admitting: Internal Medicine

## 2016-04-01 ENCOUNTER — Other Ambulatory Visit: Payer: Self-pay | Admitting: Internal Medicine

## 2016-04-21 ENCOUNTER — Other Ambulatory Visit: Payer: Self-pay | Admitting: Internal Medicine

## 2016-05-06 ENCOUNTER — Other Ambulatory Visit: Payer: Self-pay | Admitting: Internal Medicine

## 2016-05-21 ENCOUNTER — Other Ambulatory Visit: Payer: Self-pay | Admitting: Internal Medicine

## 2016-06-12 ENCOUNTER — Other Ambulatory Visit: Payer: Self-pay | Admitting: Internal Medicine

## 2016-07-03 DIAGNOSIS — E559 Vitamin D deficiency, unspecified: Secondary | ICD-10-CM | POA: Insufficient documentation

## 2016-07-10 ENCOUNTER — Other Ambulatory Visit: Payer: Self-pay | Admitting: Internal Medicine

## 2016-08-10 ENCOUNTER — Other Ambulatory Visit: Payer: Self-pay | Admitting: Internal Medicine

## 2016-08-15 ENCOUNTER — Other Ambulatory Visit: Payer: Self-pay | Admitting: Internal Medicine

## 2016-09-03 ENCOUNTER — Other Ambulatory Visit: Payer: Self-pay | Admitting: Internal Medicine

## 2019-04-03 ENCOUNTER — Encounter: Payer: Self-pay | Admitting: Emergency Medicine

## 2019-04-03 ENCOUNTER — Other Ambulatory Visit: Payer: Self-pay

## 2019-04-03 ENCOUNTER — Emergency Department: Payer: Medicare HMO

## 2019-04-03 DIAGNOSIS — M549 Dorsalgia, unspecified: Secondary | ICD-10-CM | POA: Insufficient documentation

## 2019-04-03 DIAGNOSIS — Z7982 Long term (current) use of aspirin: Secondary | ICD-10-CM | POA: Insufficient documentation

## 2019-04-03 DIAGNOSIS — Z7984 Long term (current) use of oral hypoglycemic drugs: Secondary | ICD-10-CM | POA: Insufficient documentation

## 2019-04-03 DIAGNOSIS — Z87891 Personal history of nicotine dependence: Secondary | ICD-10-CM | POA: Diagnosis not present

## 2019-04-03 DIAGNOSIS — Z79899 Other long term (current) drug therapy: Secondary | ICD-10-CM | POA: Insufficient documentation

## 2019-04-03 DIAGNOSIS — I10 Essential (primary) hypertension: Secondary | ICD-10-CM | POA: Insufficient documentation

## 2019-04-03 DIAGNOSIS — E119 Type 2 diabetes mellitus without complications: Secondary | ICD-10-CM | POA: Insufficient documentation

## 2019-04-03 LAB — CBC WITH DIFFERENTIAL/PLATELET
Abs Immature Granulocytes: 0.03 10*3/uL (ref 0.00–0.07)
Basophils Absolute: 0.1 10*3/uL (ref 0.0–0.1)
Basophils Relative: 0 %
Eosinophils Absolute: 0 10*3/uL (ref 0.0–0.5)
Eosinophils Relative: 0 %
HCT: 39.7 % (ref 36.0–46.0)
Hemoglobin: 13.5 g/dL (ref 12.0–15.0)
Immature Granulocytes: 0 %
Lymphocytes Relative: 38 %
Lymphs Abs: 4.8 10*3/uL — ABNORMAL HIGH (ref 0.7–4.0)
MCH: 31.3 pg (ref 26.0–34.0)
MCHC: 34 g/dL (ref 30.0–36.0)
MCV: 91.9 fL (ref 80.0–100.0)
Monocytes Absolute: 0.6 10*3/uL (ref 0.1–1.0)
Monocytes Relative: 4 %
Neutro Abs: 7.1 10*3/uL (ref 1.7–7.7)
Neutrophils Relative %: 58 %
Platelets: 414 10*3/uL — ABNORMAL HIGH (ref 150–400)
RBC: 4.32 MIL/uL (ref 3.87–5.11)
RDW: 13 % (ref 11.5–15.5)
WBC: 12.5 10*3/uL — ABNORMAL HIGH (ref 4.0–10.5)
nRBC: 0 % (ref 0.0–0.2)

## 2019-04-03 LAB — COMPREHENSIVE METABOLIC PANEL
ALT: 27 U/L (ref 0–44)
AST: 29 U/L (ref 15–41)
Albumin: 4.3 g/dL (ref 3.5–5.0)
Alkaline Phosphatase: 46 U/L (ref 38–126)
Anion gap: 6 (ref 5–15)
BUN: 15 mg/dL (ref 6–20)
CO2: 30 mmol/L (ref 22–32)
Calcium: 9 mg/dL (ref 8.9–10.3)
Chloride: 104 mmol/L (ref 98–111)
Creatinine, Ser: 1.16 mg/dL — ABNORMAL HIGH (ref 0.44–1.00)
GFR calc Af Amer: >60 mL/min (ref >60)
GFR calc non Af Amer: 53 mL/min — ABNORMAL LOW (ref >60)
Glucose, Bld: 191 mg/dL — ABNORMAL HIGH (ref 70–99)
Potassium: 2.8 mmol/L — ABNORMAL LOW (ref 3.5–5.1)
Sodium: 140 mmol/L (ref 135–145)
Total Bilirubin: 0.6 mg/dL (ref 0.3–1.2)
Total Protein: 7.5 g/dL (ref 6.5–8.1)

## 2019-04-03 LAB — FIBRIN DERIVATIVES D-DIMER (ARMC ONLY): Fibrin derivatives D-dimer (ARMC): 309.99 ng/mL (FEU) (ref 0.00–499.00)

## 2019-04-03 LAB — TROPONIN I (HIGH SENSITIVITY): Troponin I (High Sensitivity): 8 ng/L (ref <18)

## 2019-04-03 NOTE — ED Triage Notes (Signed)
PT from UC with c/o upper back and in between shoulder pain. PT states painful with inhalation. PT was told it was muscular spams at [facility]. Denies chest pain.

## 2019-04-04 ENCOUNTER — Emergency Department
Admission: EM | Admit: 2019-04-04 | Discharge: 2019-04-04 | Disposition: A | Discharge status: Home / Self Care | Payer: Medicare HMO | Attending: Student | Admitting: Student

## 2019-04-04 DIAGNOSIS — M549 Dorsalgia, unspecified: Secondary | ICD-10-CM

## 2019-04-04 LAB — TROPONIN I (HIGH SENSITIVITY): Troponin I (High Sensitivity): 9 ng/L (ref <18)

## 2019-04-04 MED ORDER — ACETAMINOPHEN 500 MG PO TABS
1000.0000 mg | ORAL_TABLET | Freq: Once | ORAL | Status: AC
  Administered 2019-04-04: 1000 mg via ORAL
  Filled 2019-04-04: qty 2

## 2019-04-04 MED ORDER — LIDOCAINE 5 % EX PTCH
1.0000 | MEDICATED_PATCH | CUTANEOUS | Status: DC
  Administered 2019-04-04: 1 via TRANSDERMAL
  Filled 2019-04-04: qty 1

## 2019-04-04 MED ORDER — METHOCARBAMOL 750 MG PO TABS
750.0000 mg | ORAL_TABLET | Freq: Once | ORAL | Status: AC
  Administered 2019-04-04: 750 mg via ORAL
  Filled 2019-04-04: qty 1

## 2019-04-04 MED ORDER — OXYCODONE HCL 5 MG PO TABS
5.0000 mg | ORAL_TABLET | Freq: Once | ORAL | Status: AC
  Administered 2019-04-04: 5 mg via ORAL
  Filled 2019-04-04: qty 1

## 2019-04-04 MED ORDER — METHOCARBAMOL 500 MG PO TABS: 500.0000 mg | ORAL_TABLET | Freq: Three times a day (TID) | ORAL | 0 refills | Status: AC | PRN

## 2019-04-04 MED ORDER — KETOROLAC TROMETHAMINE 30 MG/ML IJ SOLN
15.0000 mg | Freq: Once | INTRAMUSCULAR | Status: AC
  Administered 2019-04-04: 15 mg via INTRAMUSCULAR
  Filled 2019-04-04: qty 1

## 2019-04-04 NOTE — ED Notes (Signed)
Pt signed physical discharge form. 

## 2019-04-04 NOTE — Discharge Instructions (Signed)
Thank you for letting us take care of you in the emergency department today.   Please continue to take any regular, prescribed medications.   New medications we have prescribed:  - Robaxin - muscle relaxing medication, take as needed for muscle spasms.  You can also take over the counter Tylenol as needed, and can find the lidocaine patches over the counter under the name Salonpas.   Please follow up with: - Your primary care doctor and/or your spine doctor to review your ER visit and follow up on your symptoms.   Please return to the ER for any new or worsening symptoms.

## 2019-04-04 NOTE — ED Provider Notes (Signed)
[facility] Emergency Department Provider Note  ____________________________________________   First MD Initiated Contact with Patient 04/04/19 [phone]     (approximate)  I have reviewed the triage vital signs and the nursing notes.  History  Chief Complaint Back Pain    HPI Hannah Calhoun is a 58 y.o. female with history as below who presents emergency department for midthoracic paraspinal back pain.  Patient reports history of chronic back pain, DDD.  She states she developed upper back pain several days ago in the setting of increased yard work, [employer]. Described as an aching sensation, currently 9-10/10 in severity. She presents to the ED as she has been unable to improve her pain with over-the-counter medications.  Pain is worsened with movement and palpation, as well as deep inspiration. No radiation.   She denies any chest pain, shortness of breath.  No fevers, cough, difficulty breathing.  No associated weakness, numbness, tingling, or issues with bowel or bladder control.   Past Medical Hx Past Medical History:  Diagnosis Date  . Allergy   . Arthritis   . Chicken pox   . Depression   . Diabetes mellitus without complication ([place])   . Diverticulitis   . GERD (gastroesophageal reflux disease)   . History of colon polyps   . Hyperlipidemia   . Hypertension     Problem List Patient Active Problem List   Diagnosis Date Noted  . Pain in the chest 09/08/2015  . Bradycardia 09/08/2015  . Essential hypertension, benign 09/08/2015  . Hyperlipidemia 09/08/2015  . Obesity 09/08/2015  . Seasonal allergies 04/28/2015  . 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 Anxiety and depression 09/01/2014  . Obesity (BMI 30-39.9) 03/11/2014  . DM2 (diabetes mellitus, type 2) ([place]) 03/11/2014  . HTN (hypertension) 03/11/2014  . HLD (hyperlipidemia) 03/11/2014  . GERD (gastroesophageal reflux disease) 03/11/2014  . Arthritis 03/11/2014    Past  Surgical Hx Past Surgical History:  Procedure Laterality Date  . ABDOMINAL HYSTERECTOMY  2009  . BREAST BIOPSY Right 1990   EXCISIONAL - NEG  . BREAST SURGERY Right 1990  . CHOLECYSTECTOMY    . KNEE ARTHROSCOPY  2010  . NASAL SEPTUM SURGERY  2010  . TONSILLECTOMY      Medications Prior to Admission medications   Medication Sig Start Date End Date Taking? Authorizing Provider  albuterol (PROVENTIL HFA;VENTOLIN HFA) 108 (90 BASE) MCG/ACT inhaler Inhale 2 puffs into the lungs every 6 (six) hours as needed for wheezing or shortness of breath. 05/02/15   Nena Polio, MD  aspirin 81 MG tablet Take 81 mg by mouth daily. Reported on 09/23/2015    [provider]  azelastine (ASTELIN) 0.1 % nasal spray Place 2 sprays into both nostrils 2 (two) times daily. Use in each nostril as directed 06/29/15 06/28/16  Beers, Pierce Crane, PA-C  Blood Glucose Monitoring Suppl (ONE TOUCH ULTRA MINI) W/DEVICE KIT  07/30/13   [provider]  cetirizine (ZYRTEC) 10 MG tablet Take 10 mg by mouth daily. Reported on 04/28/2015    [provider]  citalopram (CELEXA) 40 MG tablet Take 1 tablet (40 mg total) by mouth daily. NO MORE REFILLS WITH ANNUAL PHYSICAL EXAM 04/21/16   Jearld Fenton, NP  citalopram (CELEXA) 40 MG tablet Take 1 tablet (40 mg total) by mouth daily. MUST SCHEDULE ANNUAL EXAM FOR FURTHER REFILLS 05/24/16   Jearld Fenton, NP  gabapentin (NEURONTIN) 300 MG capsule Take 300 mg by mouth 2 (two) times daily.  [provider]  glipiZIDE (GLUCOTROL) 5 MG tablet Take 1 tablet (5 mg total) by mouth 2 (two) times daily before a meal. NO MORE REFILLS WITHOUT AN OFFICE VISIT 07/11/16   Jearld Fenton, NP  glucose blood test strip 1 each by Other route 3 (three) times daily. 07/11/14   Jearld Fenton, NP  lisinopril (PRINIVIL,ZESTRIL) 10 MG tablet Take 10 mg by mouth daily.    [provider]  Omega-3 Fatty Acids (FISH OIL) 300 MG CAPS Take 1 capsule by mouth 2 (two)  times daily.    [provider]  Jonetta Speak LANCETS 16X [place]  07/29/13   [provider]  pantoprazole (PROTONIX) 40 MG tablet Take 1 tablet by mouth daily. 02/27/14 09/23/15  [provider]  simvastatin (ZOCOR) 10 MG tablet TAKE 1 TABLET (10 MG TOTAL) BY MOUTH AT BEDTIME. MUST SCHEDULE ANNUAL EXAM FOR FURTHER REFILLS 07/11/16   Jearld Fenton, NP    Allergies Doxycycline, Nsaids, and [place]  Family Hx Family History  Problem Relation Age of Onset  . Alcohol abuse Mother   . Hyperlipidemia Mother   . Heart disease Mother   . Hypertension Mother   . Heart attack Mother   . Alcohol abuse Father   . Hyperlipidemia Father   . Heart disease Father   . Hypertension Father   . Heart attack Father   . Alcohol abuse Maternal Aunt   . Hypertension Maternal Aunt   . Kidney disease Maternal Aunt   . Alcohol abuse Maternal Uncle   . Hypertension Maternal Uncle   . Alcohol abuse Paternal Aunt   . Birth defects Paternal Aunt        Colon, Breast, Lung  . Heart disease Paternal Aunt   . Breast cancer Paternal Aunt   . Alcohol abuse Paternal Uncle   . Heart disease Paternal Uncle   . Stroke Maternal Grandmother   . Hypertension Maternal Grandmother   . Alcohol abuse Maternal Grandfather   . Heart disease Maternal Grandfather   . Hypertension Maternal Grandfather     Social Hx Social History   Tobacco Use  . Smoking status: Former Smoker    Packs/day: 0.50    Years: 15.00    Pack years: 7.50    Quit date: 06/12/2005    Years since quitting: 13.8  . Smokeless tobacco: Never Used  . Tobacco comment: quit 2007  Substance Use Topics  . Alcohol use: Yes    Alcohol/week: 0.0 standard drinks  . Drug use: No     Review of Systems  Constitutional: Negative for fever, chills. Eyes: Negative for visual changes. ENT: Negative for sore throat. Cardiovascular: Negative for chest pain. Respiratory: Negative for shortness of breath. Gastrointestinal:  Negative for nausea, vomiting.  Genitourinary: Negative for dysuria. Musculoskeletal: + back pain Skin: Negative for rash. Neurological: Negative for for headaches.   Physical Exam  Vital Signs: ED Triage Vitals  Enc Vitals Group     BP 04/03/19 2001 (!) 185/70     Pulse Rate 04/03/19 2001 77     Resp 04/03/19 2001 16     Temp 04/03/19 2001 98.8 F (37.1 C)     Temp Source 04/03/19 2001 Oral     SpO2 04/03/19 2001 100 %     Weight --      Height --      Head Circumference --      Peak Flow --      Pain Score 04/03/19 1954 10  Pain Loc --      Pain Edu? --      Excl. in [place]? --     Constitutional: Alert and oriented.  Head: Normocephalic. Atraumatic. Eyes: Conjunctivae clear. Sclera anicteric. Nose: No congestion. No rhinorrhea. Mouth/Throat: Wearing mask.  Neck: No stridor.   Cardiovascular: Normal rate, regular rhythm. Extremities well perfused. Respiratory: Normal respiratory effort.  Lungs CTAB. Gastrointestinal: Soft. Non-tender. Non-distended.  Musculoskeletal: No lower extremity edema. No deformities Back: No midline bony C/T/L-spine tenderness.  No step-offs or deformities.  Mid thoracic paraspinal muscles tender to palpation.  No vesicles, rashes, ecchymosis, or other lesions. Neurologic:  Normal speech and language. No gross focal neurologic deficits are appreciated. UE and LE strength 5/5 and symmetric. SILT. Ambulatory with steady gait. Skin: Skin is warm, dry and intact. No rash noted. Psychiatric: Mood and affect are appropriate for situation.  EKG  Personally reviewed.   Rate: 64 Rhythm: sinus Axis: normal Intervals: WNL TWI III No STEMI    Radiology  XR:  IMPRESSION:  Minimal streaky opacities in the right mid lower lung zone, likely  atelectasis, however may represent pneumonia in the appropriate  clinical setting.    Procedures  Procedure(s) performed (including critical care):  Procedures   Initial Impression / Assessment  and Plan / ED Course  58 y.o. female who presents to the ED for mid thoracic back pain in setting of increased raking, yard work.  Ddx: suspect MSK etiology based on exam, history.  Consider atypical ACS, or PE given pleuritic component.  No significant hypertension, she has equal and symmetric distal pulses, no associated neurological findings suggestive of aortic pathology.  No red flag signs/symptoms suggestive of cord pathology or infectious spinal pathology.  Will obtain labs, XR, EKG, pain control and reassess.  EKG without acute ischemic changes.  Troponin x 2 negative.  D-dimer negative.  Mild hypokalemia.  XR with minimal streaky opacities in right mid lower lung zone, likely atelectasis versus pneumonia in appropriate clinical setting.  Given she has no fever, cough, shortness of breath, or respiratory complaints, do not feel this represents pneumonia.  She reports improvement in her symptoms, pain now 0/10 after medication.  As such, will plan for discharge, provided Rx for Robaxin, and advised futher over-the-counter medications as needed for pain control.  Advised PCP follow-up and return precautions.  Patient voices understanding is comfortable to plan and discharge.   Final Clinical Impression(s) / ED Diagnosis  Final diagnoses:  Mid back pain       Note:  This document was prepared using Dragon voice recognition software and may include unintentional dictation errors.   Lilia Pro., MD 04/04/19 [phone]

## 2019-08-05 ENCOUNTER — Ambulatory Visit: Payer: Commercial Managed Care - PPO | Attending: Internal Medicine

## 2019-08-05 DIAGNOSIS — Z23 Encounter for immunization: Secondary | ICD-10-CM

## 2019-08-05 NOTE — Progress Notes (Signed)
   Covid-19 Vaccination Clinic  Name:  Casondra Gasca    MRN: [id number] DOB: 1960-07-30  08/05/2019  Ms. Braddy was observed post Covid-19 immunization for 15 minutes without incident. She was provided with Vaccine Information Sheet and instruction to access the V-Safe system.   Ms. [place] was instructed to call 911 with any severe reactions post vaccine: Marland Kitchen Difficulty breathing  . Swelling of face and throat  . A fast heartbeat  . A bad rash all over body  . Dizziness and weakness   Immunizations Administered    Name Date Dose VIS Date Route   Pfizer COVID-19 Vaccine 08/05/2019 10:11 AM 0.3 mL 04/19/2019 Intramuscular   Manufacturer: [employer], [employer]   Lot: [id number]   NDC: [address]-0

## 2019-08-27 ENCOUNTER — Ambulatory Visit: Payer: Commercial Managed Care - PPO | Attending: Internal Medicine

## 2019-08-27 DIAGNOSIS — Z23 Encounter for immunization: Secondary | ICD-10-CM

## 2019-08-27 NOTE — Progress Notes (Signed)
   Covid-19 Vaccination Clinic  Name:  Hannah Calhoun    MRN: [id number] DOB: [birth year]  08/27/2019  Ms. Garden was observed post Covid-19 immunization for 15 minutes without incident. She was provided with Vaccine Information Sheet and instruction to access the V-Safe system.   [name] was instructed to call 911 with any severe reactions post vaccine: Marland Kitchen Difficulty breathing  . Swelling of face and throat  . A fast heartbeat  . A bad rash all over body  . Dizziness and weakness   Immunizations Administered    Name Date Dose VIS Date Route   Pfizer COVID-19 Vaccine 08/27/2019  8:19 AM 0.3 mL 07/03/2018 Intramuscular   Manufacturer: [employer], [employer]   Lot: [id number]   NDC: [address]-2

## 2019-09-05 ENCOUNTER — Ambulatory Visit (INDEPENDENT_AMBULATORY_CARE_PROVIDER_SITE_OTHER): Payer: Medicare HMO | Admitting: Family Medicine

## 2019-09-05 ENCOUNTER — Other Ambulatory Visit: Payer: Self-pay

## 2019-09-05 ENCOUNTER — Encounter: Payer: Self-pay | Admitting: Family Medicine

## 2019-09-05 VITALS — BP 138/79 | HR 76 | Temp 98.2°F | Ht 65.0 in | Wt 211.0 lb

## 2019-09-05 DIAGNOSIS — E119 Type 2 diabetes mellitus without complications: Secondary | ICD-10-CM

## 2019-09-05 DIAGNOSIS — E1159 Type 2 diabetes mellitus with other circulatory complications: Secondary | ICD-10-CM | POA: Diagnosis not present

## 2019-09-05 DIAGNOSIS — K219 Gastro-esophageal reflux disease without esophagitis: Secondary | ICD-10-CM

## 2019-09-05 DIAGNOSIS — Z6835 Body mass index (BMI) 35.0-35.9, adult: Secondary | ICD-10-CM

## 2019-09-05 DIAGNOSIS — F419 Anxiety disorder, unspecified: Secondary | ICD-10-CM

## 2019-09-05 DIAGNOSIS — I152 Hypertension secondary to endocrine disorders: Secondary | ICD-10-CM

## 2019-09-05 DIAGNOSIS — F32A Depression, unspecified: Secondary | ICD-10-CM

## 2019-09-05 DIAGNOSIS — Z7689 Persons encountering health services in other specified circumstances: Secondary | ICD-10-CM | POA: Diagnosis not present

## 2019-09-05 DIAGNOSIS — E782 Mixed hyperlipidemia: Secondary | ICD-10-CM

## 2019-09-05 DIAGNOSIS — I10 Essential (primary) hypertension: Secondary | ICD-10-CM

## 2019-09-05 DIAGNOSIS — R21 Rash and other nonspecific skin eruption: Secondary | ICD-10-CM

## 2019-09-05 DIAGNOSIS — F329 Major depressive disorder, single episode, unspecified: Secondary | ICD-10-CM

## 2019-09-05 MED ORDER — GABAPENTIN 300 MG PO CAPS: 300.0000 mg | ORAL_CAPSULE | Freq: Three times a day (TID) | ORAL | 0 refills | Status: DC

## 2019-09-05 MED ORDER — OZEMPIC (0.25 OR 0.5 MG/DOSE) 2 MG/1.5ML ~~LOC~~ SOPN: 0.2500 mg | PEN_INJECTOR | SUBCUTANEOUS | 0 refills | Status: DC

## 2019-09-05 MED ORDER — KETOCONAZOLE 2 % EX CREA: 1.0000 "application " | TOPICAL_CREAM | Freq: Every day | CUTANEOUS | 0 refills | Status: DC

## 2019-09-05 NOTE — Progress Notes (Signed)
BP 138/79   Pulse 76   Temp 98.2 F (36.8 C) (Oral)   Ht 5\' 5"  (1.651 m)   Wt 211 lb (95.7 kg)   LMP  (LMP Unknown)   SpO2 98%   BMI 35.11 kg/m    Subjective:    Patient ID: Hannah Calhoun, female    DOB: 07-03-1960, 59 y.o.   MRN: [id number]  HPI: Hannah Calhoun is a 59 y.o. female  Chief Complaint  Patient presents with  . Establish Care  . Back Pain    lower back x a few weeks  . Leg Pain    bilateral   Here today to establish care.   Trying topamax for weight loss and nerve pain, giving brain fog and has not been helping with weight so would like to come off of it.   Getting steroid injections for chronic back pain. Sometimes pain radiates down left leg. Has tylenol she takes as needed, has tried gabapentin long ago but does not remember how it worked for her. Denies bowel or bladder incontinence, fever, chills, falls.   DM - was down to 1 glipizide daily but lately has been on prednisone for back issues and fasting sugars running around 200 consistently at this time. Denies low blood sugar spells.   Right medial foot with two peeling areas, thinks it's fungal. Tried otc creams with inimal relief. Itches sometimes.   HLD - on simvastatin, tolerating well without side effects. Denies claudication, myalgias.   Anxiety and depression - used to be on celexa, doing ok right now without. Denies SI/HI.   Depression screen [facility] 2/9 09/05/2019 04/28/2015 01/20/2015  Decreased Interest 1 0 0  Down, Depressed, Hopeless 1 0 0  PHQ - 2 Score 2 0 0  Altered sleeping 2 - -  Tired, decreased energy 2 - -  Change in appetite 0 - -  Feeling bad or failure about yourself  0 - -  Trouble concentrating 3 - -  Moving slowly or fidgety/restless 0 - -  Suicidal thoughts 0 - -  PHQ-9 Score 9 - -   GAD 7 : Generalized Anxiety Score 09/05/2019  Nervous, Anxious, on Edge 1  Control/stop worrying 2  Worry too much - different things 2  Trouble relaxing 2  Restless 1    Easily annoyed or irritable 3  Afraid - awful might happen 3  Total GAD 7 Score 14   Relevant past medical, surgical, family and social history reviewed and updated as indicated. Interim medical history since our last visit reviewed. Allergies and medications reviewed and updated.  Review of Systems  Per HPI unless specifically indicated above     Objective:    BP 138/79   Pulse 76   Temp 98.2 F (36.8 C) (Oral)   Ht 5\' 5"  (1.651 m)   Wt 211 lb (95.7 kg)   LMP  (LMP Unknown)   SpO2 98%   BMI 35.11 kg/m   Wt Readings from Last 3 Encounters:  09/05/19 211 lb (95.7 kg)  09/23/15 210 lb 12 oz (95.6 kg)  09/08/15 210 lb 12 oz (95.6 kg)    Physical Exam Vitals and nursing note reviewed.  Constitutional:      Appearance: Normal appearance. She is not ill-appearing.  HENT:     Head: Atraumatic.  Eyes:     Extraocular Movements: Extraocular movements intact.     Conjunctiva/sclera: Conjunctivae normal.  Cardiovascular:     Rate and Rhythm: Normal rate and regular  rhythm.     Heart sounds: Normal heart sounds.  Pulmonary:     Effort: Pulmonary effort is normal.     Breath sounds: Normal breath sounds.  Musculoskeletal:        General: Normal range of motion.     Cervical back: Normal range of motion and neck supple.  Skin:    General: Skin is warm and dry.     Comments: Two circular areas of peeling right medial foot, no bleeding, redness, ttp   Neurological:     Mental Status: She is alert and oriented to person, place, and time.  Psychiatric:        Mood and Affect: Mood normal.        Thought Content: Thought content normal.        Judgment: Judgment normal.     Results for orders placed or performed in visit on 09/05/19  Comprehensive metabolic panel  Result Value Ref Range   Glucose 207 (H) 65 - 99 mg/dL   BUN 11 6 - 24 mg/dL   Creatinine, Ser 4.26 (H) 0.57 - 1.00 mg/dL   GFR calc non Af Amer 20 (L) >59 mL/min/1.73   GFR calc Af Amer 23 (L) >59  mL/min/1.73   BUN/Creatinine Ratio 4 (L) 9 - 23   Sodium 140 134 - 144 mmol/L   Potassium 3.3 (L) 3.5 - 5.2 mmol/L   Chloride 101 96 - 106 mmol/L   CO2 21 20 - 29 mmol/L   Calcium 9.7 8.7 - 10.2 mg/dL   Total Protein 7.3 6.0 - 8.5 g/dL   Albumin 4.8 3.8 - 4.9 g/dL   Globulin, Total 2.5 1.5 - 4.5 g/dL   Albumin/Globulin Ratio 1.9 1.2 - 2.2   Bilirubin Total <0.2 0.0 - 1.2 mg/dL   Alkaline Phosphatase 64 39 - 117 IU/L   AST 24 0 - 40 IU/L   ALT 26 0 - 32 IU/L  HgB A1c  Result Value Ref Range   Hgb A1c MFr Bld 8.7 (H) 4.8 - 5.6 %   Est. average glucose Bld gHb Est-mCnc 203 mg/dL      Assessment & Plan:   Problem List Items Addressed This Visit      Cardiovascular and Mediastinum   Hypertension associated with diabetes (HCC) - Primary    Stable and well controlled, continue current regimen      Relevant Medications   metoprolol succinate (TOPROL-XL) 25 MG 24 hr tablet   hydrochlorothiazide (HYDRODIURIL) 25 MG tablet   amLODipine (NORVASC) 10 MG tablet   simvastatin (ZOCOR) 40 MG tablet   Semaglutide,0.25 or 0.5MG /DOS, (OZEMPIC, 0.25 OR 0.5 MG/DOSE,) 2 MG/1.5ML SOPN   Other Relevant Orders   Comprehensive metabolic panel (Completed)     Digestive   GERD (gastroesophageal reflux disease)    Stable and well controlled, continue current regimen      Relevant Medications   omeprazole (PRILOSEC) 20 MG capsule     Endocrine   DM2 (diabetes mellitus, type 2) (HCC)    Not under good control according to home BS readings, will recheck labs and add ozempic for both A1C control and weight control. Risks and benefits reviewed. Also discussed to cut glipizide in half and take twice a day rather than taking full tab once daily as she has been. May need to increase dose if ozempic not bringing sugars to goal      Relevant Medications   simvastatin (ZOCOR) 40 MG tablet   Semaglutide,0.25 or 0.5MG /DOS, (OZEMPIC, 0.25 OR 0.5  MG/DOSE,) 2 MG/1.5ML SOPN   Other Relevant Orders   HgB A1c  (Completed)     Other   HLD (hyperlipidemia)    Stable, will obtain labs at upcoming CPE to check for control. Continue diet and exercise changes for further control      Relevant Medications   metoprolol succinate (TOPROL-XL) 25 MG 24 hr tablet   hydrochlorothiazide (HYDRODIURIL) 25 MG tablet   amLODipine (NORVASC) 10 MG tablet   simvastatin (ZOCOR) 40 MG tablet   Anxiety and depression    Currently stable off celexa, will continue to monitor      Obesity    D/c topamax due to side effects, will start ozempic and continue working on diet and exercise      Relevant Medications   Semaglutide,0.25 or 0.5MG /DOS, (OZEMPIC, 0.25 OR 0.5 MG/DOSE,) 2 MG/1.5ML SOPN    Other Visit Diagnoses    Encounter to establish care       Rash       right foot, trial antifungal cream and good moisturizing regimen. Avoid pressure points with shoes       Follow up plan: Return in about 4 weeks (around 10/03/2019) for dm, back pain f/u.

## 2019-09-05 NOTE — Patient Instructions (Addendum)
Clotrimazole cream should be available over the counter if ketoconazole is not covered  Can cut your glipizide in half and take it twice daily

## 2019-09-06 ENCOUNTER — Telehealth: Payer: Self-pay | Admitting: Family Medicine

## 2019-09-06 ENCOUNTER — Encounter: Payer: Self-pay | Admitting: Family Medicine

## 2019-09-06 DIAGNOSIS — R7989 Other specified abnormal findings of blood chemistry: Secondary | ICD-10-CM

## 2019-09-06 LAB — COMPREHENSIVE METABOLIC PANEL
ALT: 26 IU/L (ref 0–32)
AST: 24 IU/L (ref 0–40)
Albumin/Globulin Ratio: 1.9 (ref 1.2–2.2)
Albumin: 4.8 g/dL (ref 3.8–4.9)
Alkaline Phosphatase: 64 IU/L (ref 39–117)
BUN/Creatinine Ratio: 4 — ABNORMAL LOW (ref 9–23)
BUN: 11 mg/dL (ref 6–24)
Bilirubin Total: <0.2 mg/dL (ref 0.0–1.2)
CO2: 21 mmol/L (ref 20–29)
Calcium: 9.7 mg/dL (ref 8.7–10.2)
Chloride: 101 mmol/L (ref 96–106)
Creatinine, Ser: 2.62 mg/dL — ABNORMAL HIGH (ref 0.57–1.00)
GFR calc Af Amer: 23 mL/min/{1.73_m2} — ABNORMAL LOW (ref >59)
GFR calc non Af Amer: 20 mL/min/{1.73_m2} — ABNORMAL LOW (ref >59)
Globulin, Total: 2.5 g/dL (ref 1.5–4.5)
Glucose: 207 mg/dL — ABNORMAL HIGH (ref 65–99)
Potassium: 3.3 mmol/L — ABNORMAL LOW (ref 3.5–5.2)
Sodium: 140 mmol/L (ref 134–144)
Total Protein: 7.3 g/dL (ref 6.0–8.5)

## 2019-09-06 LAB — HEMOGLOBIN A1C
Est. average glucose Bld gHb Est-mCnc: 203 mg/dL
Hgb A1c MFr Bld: 8.7 % — ABNORMAL HIGH (ref 4.8–5.6)

## 2019-09-06 NOTE — Telephone Encounter (Signed)
Called to discuss lab results, left VM to return call 

## 2019-09-09 NOTE — Assessment & Plan Note (Addendum)
Not under good control according to home BS readings, will recheck labs and add ozempic for both A1C control and weight control. Risks and benefits reviewed. Also discussed to cut glipizide in half and take twice a day rather than taking full tab once daily as she has been. May need to increase dose if ozempic not bringing sugars to goal

## 2019-09-09 NOTE — Telephone Encounter (Signed)
Returned pt call, discussed lab results in detail.   Significantly elevated serum creatinine, will hold off on starting ozempic, drink plenty of water, and recheck in 2-3 days with lab only visit. May need to hold HCTZ and add another BP medication if no improvement. Will place urgent Neprhology referral for pt, though she is hesitant as she has had this situation happen several times in the past and workups always negative per patient.   Also low potassium, will add extra potassium rich foods and recheck this as well at upcoming lab visit.   Uncontrolled BSs - since we are holding ozempic, recommended starting low dose insulin to control blood sugars while renal function is evaluated. Pt does not wish to proceed with this and instead wishes to increase her glipizide to BID as she's done this in the past with good results. WIll monitor home BSs closely and adjust if still poorly controlled.   Upcoming f/u already scheduled for several weeks out.

## 2019-09-09 NOTE — Assessment & Plan Note (Signed)
Currently stable off celexa, will continue to monitor. 

## 2019-09-09 NOTE — Assessment & Plan Note (Signed)
Stable, will obtain labs at upcoming CPE to check for control. Continue diet and exercise changes for further control

## 2019-09-09 NOTE — Telephone Encounter (Signed)
Patient Is returning PCP call regarding labs. Call back [phone]

## 2019-09-09 NOTE — Assessment & Plan Note (Signed)
Stable and well controlled, continue current regimen 

## 2019-09-09 NOTE — Assessment & Plan Note (Signed)
D/c topamax due to side effects, will start ozempic and continue working on diet and exercise

## 2019-09-11 ENCOUNTER — Other Ambulatory Visit: Payer: Medicare HMO

## 2019-09-11 ENCOUNTER — Other Ambulatory Visit: Payer: Self-pay

## 2019-09-11 DIAGNOSIS — R7989 Other specified abnormal findings of blood chemistry: Secondary | ICD-10-CM

## 2019-09-12 LAB — BASIC METABOLIC PANEL
BUN/Creatinine Ratio: 10 (ref 9–23)
BUN: 8 mg/dL (ref 6–24)
CO2: 27 mmol/L (ref 20–29)
Calcium: 9.6 mg/dL (ref 8.7–10.2)
Chloride: 100 mmol/L (ref 96–106)
Creatinine, Ser: 0.81 mg/dL (ref 0.57–1.00)
GFR calc Af Amer: 94 mL/min/{1.73_m2} (ref >59)
GFR calc non Af Amer: 81 mL/min/{1.73_m2} (ref >59)
Glucose: 121 mg/dL — ABNORMAL HIGH (ref 65–99)
Potassium: 3.6 mmol/L (ref 3.5–5.2)
Sodium: 140 mmol/L (ref 134–144)

## 2019-09-17 DIAGNOSIS — N1831 Chronic kidney disease, stage 3a: Secondary | ICD-10-CM | POA: Insufficient documentation

## 2019-09-26 ENCOUNTER — Encounter: Payer: Self-pay | Admitting: Family Medicine

## 2019-10-03 ENCOUNTER — Ambulatory Visit: Payer: Medicare HMO | Admitting: Family Medicine

## 2019-10-27 ENCOUNTER — Other Ambulatory Visit: Payer: Self-pay | Admitting: Family Medicine

## 2019-10-27 NOTE — Telephone Encounter (Signed)
Requested Prescriptions  Pending Prescriptions Disp Refills  . OZEMPIC, 0.25 OR 0.5 MG/DOSE, 2 MG/1.5ML SOPN [Pharmacy Med Name: OZEMPIC 0.25-0.5 MG DOSE PEN] 1 pen 0    Sig: INJECT 0.25 MG INTO THE SKIN ONCE A WEEK.     Endocrinology:  Diabetes - GLP-1 Receptor Agonists Failed - 10/27/2019  5:09 PM      Failed - HBA1C is between 0 and 7.9 and within 180 days    Hgb A1c MFr Bld  Date Value Ref Range Status  09/05/2019 8.7 (H) 4.8 - 5.6 % Final    Comment:             Prediabetes: 5.7 - 6.4          Diabetes: >6.4          Glycemic control for adults with diabetes: <7.0          Passed - Valid encounter within last 6 months    Recent Outpatient Visits          1 month ago Hypertension associated with diabetes [facility])   [facility] [place], Salley Hews, [place]      Future Appointments            In 4 days Maurice March, [place]ley Hews, PA-C [facility]

## 2019-10-31 ENCOUNTER — Ambulatory Visit (INDEPENDENT_AMBULATORY_CARE_PROVIDER_SITE_OTHER): Payer: Medicare HMO | Admitting: Family Medicine

## 2019-10-31 ENCOUNTER — Other Ambulatory Visit: Payer: Self-pay

## 2019-10-31 ENCOUNTER — Encounter: Payer: Self-pay | Admitting: Family Medicine

## 2019-10-31 VITALS — BP 113/76 | HR 64 | Temp 98.2°F | Wt 218.0 lb

## 2019-10-31 DIAGNOSIS — Z1231 Encounter for screening mammogram for malignant neoplasm of breast: Secondary | ICD-10-CM | POA: Diagnosis not present

## 2019-10-31 DIAGNOSIS — E119 Type 2 diabetes mellitus without complications: Secondary | ICD-10-CM

## 2019-10-31 MED ORDER — TRIAMCINOLONE ACETONIDE 0.1 % EX CREA: 1.0000 "application " | TOPICAL_CREAM | Freq: Two times a day (BID) | CUTANEOUS | 0 refills | Status: DC

## 2019-10-31 MED ORDER — OZEMPIC (0.25 OR 0.5 MG/DOSE) 2 MG/1.5ML ~~LOC~~ SOPN: 0.5000 mg | PEN_INJECTOR | SUBCUTANEOUS | 2 refills | Status: DC

## 2019-10-31 MED ORDER — GLIPIZIDE 5 MG PO TABS: 5.0000 mg | ORAL_TABLET | Freq: Two times a day (BID) | ORAL | 1 refills | Status: DC

## 2019-10-31 NOTE — Progress Notes (Signed)
BP 113/76    Pulse 64    Temp 98.2 F (36.8 C) (Oral)    Wt 218 lb (98.9 kg)    LMP  (LMP Unknown)    SpO2 99%    BMI 36.28 kg/m    Subjective:    Patient ID: Hannah Calhoun, female    DOB: [birth year], 59 y.o.   MRN: [id number]  HPI: Hannah Calhoun is a 59 y.o. female  Chief Complaint  Patient presents with   Diabetes   Back Pain   Here today for DM f/u after starting low dose ozempic. So far tolerating very well. Denies side effects. Home BSs fasting in AM running around 110-120. No low blood sugar spells. Trying to watch diet and be as active as tolerated.   Relevant past medical, surgical, family and social history reviewed and updated as indicated. Interim medical history since our last visit reviewed. Allergies and medications reviewed and updated.  Review of Systems  Per HPI unless specifically indicated above     Objective:    BP 113/76    Pulse 64    Temp 98.2 F (36.8 C) (Oral)    Wt 218 lb (98.9 kg)    LMP  (LMP Unknown)    SpO2 99%    BMI 36.28 kg/m   Wt Readings from Last 3 Encounters:  10/31/19 218 lb (98.9 kg)  09/05/19 211 lb (95.7 kg)  09/23/15 210 lb 12 oz (95.6 kg)    Physical Exam Vitals and nursing note reviewed.  Constitutional:      Appearance: Normal appearance. She is not ill-appearing.  HENT:     Head: Atraumatic.  Eyes:     Extraocular Movements: Extraocular movements intact.     Conjunctiva/sclera: Conjunctivae normal.  Cardiovascular:     Rate and Rhythm: Normal rate and regular rhythm.     Heart sounds: Normal heart sounds.  Pulmonary:     Effort: Pulmonary effort is normal.     Breath sounds: Normal breath sounds.  Musculoskeletal:        General: Normal range of motion.     Cervical back: Normal range of motion and neck supple.  Skin:    General: Skin is warm and dry.  Neurological:     Mental Status: She is alert and oriented to person, place, and time.  Psychiatric:        Mood and Affect: Mood normal.         Thought Content: Thought content normal.        Judgment: Judgment normal.     Results for orders placed or performed in visit on 09/11/19  Basic metabolic panel  Result Value Ref Range   Glucose 121 (H) 65 - 99 mg/dL   BUN 8 6 - 24 mg/dL   Creatinine, Ser 0.93 0.57 - 1.00 mg/dL   GFR calc non Af Amer 81 >59 mL/min/1.73   GFR calc Af Amer 94 >59 mL/min/1.73   BUN/Creatinine Ratio 10 9 - 23   Sodium 140 134 - 144 mmol/L   Potassium 3.6 3.5 - 5.2 mmol/L   Chloride 100 96 - 106 mmol/L   CO2 27 20 - 29 mmol/L   Calcium 9.6 8.7 - 10.2 mg/dL      Assessment & Plan:   Problem List Items Addressed This Visit      Endocrine   DM2 (diabetes mellitus, type 2) (HCC)    Increase ozempic to 0.5 mg weekly and continue glipizide. Continue to monitor home  BSs prn, diet and exercise reviewed      Relevant Medications   glipiZIDE (GLUCOTROL) 5 MG tablet   Semaglutide,0.25 or 0.5MG /DOS, (OZEMPIC, 0.25 OR 0.5 MG/DOSE,) 2 MG/1.5ML SOPN    Other Visit Diagnoses    Encounter for screening mammogram for malignant neoplasm of breast    -  Primary   Relevant Orders   MM DIGITAL SCREENING BILATERAL       Follow up plan: Return in about 2 months (around 12/31/2019) for 6 month f/u.

## 2019-11-03 NOTE — Assessment & Plan Note (Signed)
Increase ozempic to 0.5 mg weekly and continue glipizide. Continue to monitor home BSs prn, diet and exercise reviewed

## 2019-11-04 ENCOUNTER — Other Ambulatory Visit: Payer: Self-pay | Admitting: Family Medicine

## 2019-11-04 DIAGNOSIS — Z1231 Encounter for screening mammogram for malignant neoplasm of breast: Secondary | ICD-10-CM

## 2019-11-05 ENCOUNTER — Other Ambulatory Visit: Payer: Self-pay | Admitting: Family Medicine

## 2019-11-13 ENCOUNTER — Ambulatory Visit
Admission: RE | Admit: 2019-11-13 | Discharge: 2019-11-13 | Disposition: A | Discharge status: Home / Self Care | Payer: Medicare HMO | Source: Ambulatory Visit | Attending: Family Medicine | Admitting: Family Medicine

## 2019-11-13 DIAGNOSIS — Z1231 Encounter for screening mammogram for malignant neoplasm of breast: Secondary | ICD-10-CM | POA: Diagnosis not present

## 2019-11-19 ENCOUNTER — Inpatient Hospital Stay
Admission: RE | Admit: 2019-11-19 | Discharge: 2019-11-19 | Disposition: A | Discharge status: Home / Self Care | Payer: Self-pay | Source: Ambulatory Visit | Attending: *Deleted | Admitting: *Deleted

## 2019-11-19 ENCOUNTER — Other Ambulatory Visit: Payer: Self-pay | Admitting: *Deleted

## 2019-11-19 DIAGNOSIS — Z1231 Encounter for screening mammogram for malignant neoplasm of breast: Secondary | ICD-10-CM

## 2019-11-25 ENCOUNTER — Telehealth: Payer: Self-pay | Admitting: Family Medicine

## 2019-11-25 NOTE — Telephone Encounter (Signed)
Patient is calling to report that she had Medicare Wellness visit with Dr. Alesia Richards 06/20/19 [facility]. Please advise Cb- [phone]

## 2019-12-25 LAB — HM DIABETES EYE EXAM

## 2019-12-26 ENCOUNTER — Other Ambulatory Visit: Payer: Self-pay | Admitting: Family Medicine

## 2020-01-01 ENCOUNTER — Encounter: Payer: Self-pay | Admitting: Family Medicine

## 2020-01-01 ENCOUNTER — Ambulatory Visit (INDEPENDENT_AMBULATORY_CARE_PROVIDER_SITE_OTHER): Payer: Medicare HMO | Admitting: Family Medicine

## 2020-01-01 ENCOUNTER — Other Ambulatory Visit: Payer: Self-pay

## 2020-01-01 VITALS — BP 118/61 | HR 77 | Temp 99.0°F | Wt 219.0 lb

## 2020-01-01 DIAGNOSIS — E782 Mixed hyperlipidemia: Secondary | ICD-10-CM

## 2020-01-01 DIAGNOSIS — I152 Hypertension secondary to endocrine disorders: Secondary | ICD-10-CM

## 2020-01-01 DIAGNOSIS — Z114 Encounter for screening for human immunodeficiency virus [HIV]: Secondary | ICD-10-CM | POA: Diagnosis not present

## 2020-01-01 DIAGNOSIS — E1159 Type 2 diabetes mellitus with other circulatory complications: Secondary | ICD-10-CM | POA: Diagnosis not present

## 2020-01-01 DIAGNOSIS — I10 Essential (primary) hypertension: Secondary | ICD-10-CM

## 2020-01-01 DIAGNOSIS — E119 Type 2 diabetes mellitus without complications: Secondary | ICD-10-CM | POA: Diagnosis not present

## 2020-01-01 MED ORDER — KLOR-CON M20 20 MEQ PO TBCR: 20.0000 meq | EXTENDED_RELEASE_TABLET | Freq: Two times a day (BID) | ORAL | 1 refills | Status: DC

## 2020-01-01 MED ORDER — METOPROLOL SUCCINATE ER 25 MG PO TB24: 25.0000 mg | ORAL_TABLET | Freq: Every day | ORAL | 1 refills | Status: DC

## 2020-01-01 MED ORDER — SIMVASTATIN 40 MG PO TABS: 40.0000 mg | ORAL_TABLET | Freq: Every day | ORAL | 1 refills | Status: DC

## 2020-01-01 MED ORDER — GABAPENTIN 300 MG PO CAPS: ORAL_CAPSULE | ORAL | 1 refills | Status: DC

## 2020-01-01 MED ORDER — OZEMPIC (0.25 OR 0.5 MG/DOSE) 2 MG/1.5ML ~~LOC~~ SOPN: 0.5000 mg | PEN_INJECTOR | SUBCUTANEOUS | 1 refills | Status: DC

## 2020-01-01 MED ORDER — AMLODIPINE BESYLATE 10 MG PO TABS: 10.0000 mg | ORAL_TABLET | Freq: Every day | ORAL | 1 refills | Status: DC

## 2020-01-01 MED ORDER — GLIPIZIDE 5 MG PO TABS: 5.0000 mg | ORAL_TABLET | Freq: Two times a day (BID) | ORAL | 1 refills | Status: DC

## 2020-01-01 MED ORDER — HYDROCHLOROTHIAZIDE 25 MG PO TABS: 25.0000 mg | ORAL_TABLET | Freq: Every day | ORAL | 1 refills | Status: DC

## 2020-01-01 MED ORDER — FLUTICASONE PROPIONATE 50 MCG/ACT NA SUSP: 1.0000 | Freq: Every day | NASAL | 11 refills | Status: DC

## 2020-01-01 NOTE — Progress Notes (Signed)
BP 118/61   Pulse 77   Temp 99 F (37.2 C) (Oral)   Wt 219 lb (99.3 kg)   LMP  (LMP Unknown)   SpO2 99%   BMI 36.44 kg/m    Subjective:    Patient ID: Hannah Calhoun, female    DOB: [birth year], 59 y.o.   MRN: [id number]  HPI: Hannah Calhoun is a 59 y.o. female  Chief Complaint  Patient presents with  . Diabetes  . Hypertension   Here today for 6 month f/u chronic conditions.   DM - Has been out of ozempic for 2 weeks now. Home BSs even off ozempic are in the 150s, on ozempic around 100-110 range typically. Eating healthy diet, trying to stay active. Denies side effects, Hypoglycemic episodes, polyuria, polydipsia, polyphagia.   Home BPs have been 110-120s/60s. Tolerating metoprolol and HCTZ regimen well. Denies CP, SOB, HAs, dizziness.   HLD - on simvastatin, denies myalgias, claudication.  The 10-year ASCVD risk score Denman George DC Montez Hageman., et al., 2013) is: 8.6%   Values used to calculate the score:     Age: 89 years     Sex: Female     Is Non-Hispanic African American: Yes     Diabetic: Yes     Tobacco smoker: No     Systolic Blood Pressure: 118 mmHg     Is BP treated: Yes     HDL Cholesterol: 47 mg/dL     Total Cholesterol: 156 mg/dL  Relevant past medical, surgical, family and social history reviewed and updated as indicated. Interim medical history since our last visit reviewed. Allergies and medications reviewed and updated.  Review of Systems  Per HPI unless specifically indicated above     Objective:    BP 118/61   Pulse 77   Temp 99 F (37.2 C) (Oral)   Wt 219 lb (99.3 kg)   LMP  (LMP Unknown)   SpO2 99%   BMI 36.44 kg/m   Wt Readings from Last 3 Encounters:  01/01/20 219 lb (99.3 kg)  10/31/19 218 lb (98.9 kg)  09/05/19 211 lb (95.7 kg)    Physical Exam Vitals and nursing note reviewed.  Constitutional:      Appearance: Normal appearance. She is not ill-appearing.  HENT:     Head: Atraumatic.  Eyes:     Extraocular Movements:  Extraocular movements intact.     Conjunctiva/sclera: Conjunctivae normal.  Cardiovascular:     Rate and Rhythm: Normal rate and regular rhythm.     Heart sounds: Normal heart sounds.  Pulmonary:     Effort: Pulmonary effort is normal.     Breath sounds: Normal breath sounds.  Musculoskeletal:        General: Normal range of motion.     Cervical back: Normal range of motion and neck supple.  Skin:    General: Skin is warm and dry.  Neurological:     Mental Status: She is alert and oriented to person, place, and time.  Psychiatric:        Mood and Affect: Mood normal.        Thought Content: Thought content normal.        Judgment: Judgment normal.     Results for orders placed or performed in visit on 01/01/20  HIV Antibody (routine testing w rflx)  Result Value Ref Range   HIV Screen 4th Generation wRfx Non Reactive Non Reactive  Comprehensive metabolic panel  Result Value Ref Range   Glucose 212 (H)  65 - 99 mg/dL   BUN 11 6 - 24 mg/dL   Creatinine, Ser 7.37 0.57 - 1.00 mg/dL   GFR calc non Af Amer 76 >59 mL/min/1.73   GFR calc Af Amer 87 >59 mL/min/1.73   BUN/Creatinine Ratio 13 9 - 23   Sodium 141 134 - 144 mmol/L   Potassium 3.7 3.5 - 5.2 mmol/L   Chloride 103 96 - 106 mmol/L   CO2 26 20 - 29 mmol/L   Calcium 9.3 8.7 - 10.2 mg/dL   Total Protein 6.9 6.0 - 8.5 g/dL   Albumin 4.4 3.8 - 4.9 g/dL   Globulin, Total 2.5 1.5 - 4.5 g/dL   Albumin/Globulin Ratio 1.8 1.2 - 2.2   Bilirubin Total <0.2 0.0 - 1.2 mg/dL   Alkaline Phosphatase 72 48 - 121 IU/L   AST 23 0 - 40 IU/L   ALT 28 0 - 32 IU/L  HgB A1c  Result Value Ref Range   Hgb A1c MFr Bld 6.6 (H) 4.8 - 5.6 %   Est. average glucose Bld gHb Est-mCnc 143 mg/dL  Lipid Panel w/o Chol/HDL Ratio  Result Value Ref Range   Cholesterol, Total 156 100 - 199 mg/dL   Triglycerides 106 (H) 0 - 149 mg/dL   HDL 47 >26 mg/dL   VLDL Cholesterol Cal 34 5 - 40 mg/dL   LDL Chol Calc (NIH) 75 0 - 99 mg/dL      Assessment &  Plan:   Problem List Items Addressed This Visit      Cardiovascular and Mediastinum   Hypertension associated with diabetes (HCC)    BPs stable and WNL, continue present medications      Relevant Medications   Semaglutide,0.25 or 0.5MG /DOS, (OZEMPIC, 0.25 OR 0.5 MG/DOSE,) 2 MG/1.5ML SOPN   glipiZIDE (GLUCOTROL) 5 MG tablet   simvastatin (ZOCOR) 40 MG tablet   metoprolol succinate (TOPROL-XL) 25 MG 24 hr tablet   hydrochlorothiazide (HYDRODIURIL) 25 MG tablet   amLODipine (NORVASC) 10 MG tablet   Other Relevant Orders   Comprehensive metabolic panel (Completed)     Endocrine   DM2 (diabetes mellitus, type 2) (HCC)    Recheck A1C, adjust if needed. Of note, she's been out of ozempic x 2 weeks now at time of draw. States it was a pharmacy issue and is resolved now, she will restart immediately. Continue ozempic and glipizide, diet and exercise      Relevant Medications   Semaglutide,0.25 or 0.5MG /DOS, (OZEMPIC, 0.25 OR 0.5 MG/DOSE,) 2 MG/1.5ML SOPN   glipiZIDE (GLUCOTROL) 5 MG tablet   simvastatin (ZOCOR) 40 MG tablet   Other Relevant Orders   HgB A1c (Completed)     Other   HLD (hyperlipidemia)    Recheck lipids, adjust as needed. Continue simvastatin and lifestyle modifications      Relevant Medications   simvastatin (ZOCOR) 40 MG tablet   metoprolol succinate (TOPROL-XL) 25 MG 24 hr tablet   hydrochlorothiazide (HYDRODIURIL) 25 MG tablet   amLODipine (NORVASC) 10 MG tablet   Other Relevant Orders   Lipid Panel w/o Chol/HDL Ratio (Completed)    Other Visit Diagnoses    Encounter for screening for HIV    -  Primary   Relevant Orders   HIV Antibody (routine testing w rflx) (Completed)       Follow up plan: Return in about 6 months (around 07/03/2020) for CPE.

## 2020-01-02 LAB — LIPID PANEL W/O CHOL/HDL RATIO
Cholesterol, Total: 156 mg/dL (ref 100–199)
HDL: 47 mg/dL (ref >39)
LDL Chol Calc (NIH): 75 mg/dL (ref 0–99)
Triglycerides: 207 mg/dL — ABNORMAL HIGH (ref 0–149)
VLDL Cholesterol Cal: 34 mg/dL (ref 5–40)

## 2020-01-02 LAB — COMPREHENSIVE METABOLIC PANEL
ALT: 28 IU/L (ref 0–32)
AST: 23 IU/L (ref 0–40)
Albumin/Globulin Ratio: 1.8 (ref 1.2–2.2)
Albumin: 4.4 g/dL (ref 3.8–4.9)
Alkaline Phosphatase: 72 IU/L (ref 48–121)
BUN/Creatinine Ratio: 13 (ref 9–23)
BUN: 11 mg/dL (ref 6–24)
Bilirubin Total: <0.2 mg/dL (ref 0.0–1.2)
CO2: 26 mmol/L (ref 20–29)
Calcium: 9.3 mg/dL (ref 8.7–10.2)
Chloride: 103 mmol/L (ref 96–106)
Creatinine, Ser: 0.86 mg/dL (ref 0.57–1.00)
GFR calc Af Amer: 87 mL/min/{1.73_m2} (ref >59)
GFR calc non Af Amer: 76 mL/min/{1.73_m2} (ref >59)
Globulin, Total: 2.5 g/dL (ref 1.5–4.5)
Glucose: 212 mg/dL — ABNORMAL HIGH (ref 65–99)
Potassium: 3.7 mmol/L (ref 3.5–5.2)
Sodium: 141 mmol/L (ref 134–144)
Total Protein: 6.9 g/dL (ref 6.0–8.5)

## 2020-01-02 LAB — HEMOGLOBIN A1C
Est. average glucose Bld gHb Est-mCnc: 143 mg/dL
Hgb A1c MFr Bld: 6.6 % — ABNORMAL HIGH (ref 4.8–5.6)

## 2020-01-02 LAB — HIV ANTIBODY (ROUTINE TESTING W REFLEX): HIV Screen 4th Generation wRfx: NONREACTIVE

## 2020-01-03 NOTE — Assessment & Plan Note (Signed)
BPs stable and WNL, continue present medications 

## 2020-01-03 NOTE — Assessment & Plan Note (Addendum)
Recheck A1C, adjust if needed. Of note, she's been out of ozempic x 2 weeks now at time of draw. States it was a pharmacy issue and is resolved now, she will restart immediately. Continue ozempic and glipizide, diet and exercise

## 2020-01-03 NOTE — Assessment & Plan Note (Signed)
Recheck lipids, adjust as needed. Continue simvastatin and lifestyle modifications

## 2020-04-07 ENCOUNTER — Telehealth: Payer: Self-pay | Admitting: Family Medicine

## 2020-04-07 ENCOUNTER — Other Ambulatory Visit: Payer: Self-pay | Admitting: Family Medicine

## 2020-04-07 NOTE — Telephone Encounter (Signed)
PT need a refill  gabapentin (NEURONTIN) 300 MG capsule [[id number]]  simvastatin (ZOCOR) 40 MG tablet [[id number]]  [facility] [address] [place] - [address]  [address] [place] [address]  Phone: [phone] Fax: [phone]

## 2020-04-07 NOTE — Telephone Encounter (Signed)
Patient has refills available at requested pharmacy.Patient states she contacted pharmacy previously and was told that she did not have refills available.  Gabapentin was sent to pharmacy 04/07/20. Refill of Simvastatin available at requested pharmacy. Last refilled on 01/01/20 #90 with 1 refill. Patient advised to contact pharmacy for available refills. Understanding verbalized.

## 2020-05-08 ENCOUNTER — Other Ambulatory Visit: Payer: Self-pay | Admitting: Family Medicine

## 2020-05-11 ENCOUNTER — Other Ambulatory Visit: Payer: Self-pay

## 2020-05-11 MED ORDER — SIMVASTATIN 40 MG PO TABS: 40.0000 mg | ORAL_TABLET | Freq: Every day | ORAL | 1 refills | Status: DC

## 2020-05-11 NOTE — Telephone Encounter (Signed)
Refill request for simvastatin 40 mg   Last OV 01/01/20 Future OV 07/03/20

## 2020-05-15 ENCOUNTER — Other Ambulatory Visit: Payer: Self-pay

## 2020-05-15 ENCOUNTER — Ambulatory Visit (LOCAL_COMMUNITY_HEALTH_CENTER): Payer: Medicare (Managed Care)

## 2020-05-15 DIAGNOSIS — Z111 Encounter for screening for respiratory tuberculosis: Secondary | ICD-10-CM

## 2020-05-18 ENCOUNTER — Ambulatory Visit (LOCAL_COMMUNITY_HEALTH_CENTER): Payer: Medicare (Managed Care)

## 2020-05-18 ENCOUNTER — Other Ambulatory Visit: Payer: Self-pay

## 2020-05-18 DIAGNOSIS — Z111 Encounter for screening for respiratory tuberculosis: Secondary | ICD-10-CM

## 2020-05-18 LAB — TB SKIN TEST
Induration: 5 mm
TB Skin Test: NEGATIVE

## 2020-05-19 ENCOUNTER — Ambulatory Visit: Payer: Medicare HMO | Admitting: Family Medicine

## 2020-05-20 ENCOUNTER — Other Ambulatory Visit: Payer: Self-pay

## 2020-05-20 ENCOUNTER — Encounter: Payer: Self-pay | Admitting: Family Medicine

## 2020-05-20 ENCOUNTER — Ambulatory Visit (INDEPENDENT_AMBULATORY_CARE_PROVIDER_SITE_OTHER): Payer: Medicare (Managed Care) | Admitting: Family Medicine

## 2020-05-20 VITALS — BP 148/85 | HR 68 | Temp 98.1°F | Wt 220.0 lb

## 2020-05-20 DIAGNOSIS — G5691 Unspecified mononeuropathy of right upper limb: Secondary | ICD-10-CM

## 2020-05-20 DIAGNOSIS — Z789 Other specified health status: Secondary | ICD-10-CM | POA: Diagnosis not present

## 2020-05-20 MED ORDER — PREDNISONE 50 MG PO TABS
50.0000 mg | ORAL_TABLET | Freq: Every day | ORAL | 0 refills | Status: DC
Start: 2020-05-20 — End: 2020-07-03

## 2020-05-20 NOTE — Patient Instructions (Addendum)
It was great to see you!  Our plans for today:  - Take the steroids as prescribed.  - Keep an eye on your blood sugars as the steroids can increase these.  - Avoid movements that aggravate your pain. - Come back to see [place] if you are still having issues after taking the steroids.  - If you develop color changes, loss of function of your hand, seek attention immediately.  We are checking some labs today, we will release these results to your MyChart.  Take care and seek immediate care sooner if you develop any concerns.   Dr. Linwood Dibbles

## 2020-05-20 NOTE — Assessment & Plan Note (Signed)
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 Will trial steroid course given no relief with gabapentin or muscle relaxers. Recommend watching blood sugars. Recommend stretching and avoiding aggravating movements. F/u if no better, would consider further EMG testing at that time.

## 2020-05-20 NOTE — Progress Notes (Signed)
SUBJECTIVE:   CHIEF COMPLAINT / HPI:   Patient Active Problem List   Diagnosis Date Noted  . 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 Bradycardia 09/08/2015  . Essential hypertension, benign 09/08/2015  . Obesity 09/08/2015  . Seasonal allergies 04/28/2015  . 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 Anxiety and depression 09/01/2014  . Obesity (BMI 30-39.9) 03/11/2014  . DM2 (diabetes mellitus, type 2) ([place]) 03/11/2014  . Hypertension associated with diabetes ([place]) 03/11/2014  . HLD (hyperlipidemia) 03/11/2014  . GERD (gastroesophageal reflux disease) 03/11/2014  . Arthritis 03/11/2014   ARM PAIN - endorsing R forearm > biceps pain (burning) for the past few months. - constant - worsened with pulling, pushing, rotational movement - no known trauma or injury - feels like she is losing grip in R hand. Is R handed - no issues with L arm - is a HH nurse  - has h/o trigger finger - gabapentin, muscle relaxer not helping - denies redness, swelling, color change to hand, fevers, SOB, chest pain, decreased sensation, paresthesias   OBJECTIVE:   BP (!) 148/85   Pulse 68   Temp 98.1 F (36.7 C)   Wt 220 lb (99.8 kg)   LMP  (LMP Unknown)   SpO2 98%   BMI 36.61 kg/m   Gen: well appearing, in NAD MSK: no asymmetry, rashes, lesions. TTP over R supinator muscle with reproduction of symptoms. Grip strength slightly decreased on R side. Thenar/hypothenar eminence without wasting. Intact sensation to light touch. Wrist and finger abduction strength intact.  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 Will trial steroid course given no relief with gabapentin or muscle relaxers. Recommend watching blood sugars. Recommend stretching and avoiding aggravating movements. F/u if no better, would consider further  EMG testing at that time.   Need for Rubella titer Needs MMR titers for work.   Myles Gip, DO

## 2020-05-21 LAB — MEASLES/MUMPS/RUBELLA IMMUNITY
MUMPS ABS, IGG: <9 AU/mL — ABNORMAL LOW (ref >10.9)
RUBEOLA AB, IGG: >300 AU/mL (ref >16.4)
Rubella Antibodies, IGG: 6.51 index (ref >0.99)

## 2020-05-31 ENCOUNTER — Other Ambulatory Visit: Payer: Self-pay | Admitting: Family Medicine

## 2020-06-17 ENCOUNTER — Other Ambulatory Visit: Payer: Self-pay | Admitting: Nurse Practitioner

## 2020-06-17 ENCOUNTER — Other Ambulatory Visit: Payer: Self-pay | Admitting: Family Medicine

## 2020-06-27 ENCOUNTER — Encounter: Payer: Self-pay | Admitting: Nurse Practitioner

## 2020-07-03 ENCOUNTER — Other Ambulatory Visit: Payer: Self-pay

## 2020-07-03 ENCOUNTER — Ambulatory Visit (INDEPENDENT_AMBULATORY_CARE_PROVIDER_SITE_OTHER): Payer: Medicare (Managed Care) | Admitting: Nurse Practitioner

## 2020-07-03 ENCOUNTER — Encounter: Payer: Self-pay | Admitting: Nurse Practitioner

## 2020-07-03 VITALS — BP 136/85 | HR 68 | Temp 98.0°F | Ht 66.0 in | Wt 214.4 lb

## 2020-07-03 DIAGNOSIS — E559 Vitamin D deficiency, unspecified: Secondary | ICD-10-CM

## 2020-07-03 DIAGNOSIS — E782 Mixed hyperlipidemia: Secondary | ICD-10-CM | POA: Diagnosis not present

## 2020-07-03 DIAGNOSIS — E119 Type 2 diabetes mellitus without complications: Secondary | ICD-10-CM | POA: Diagnosis not present

## 2020-07-03 DIAGNOSIS — E1159 Type 2 diabetes mellitus with other circulatory complications: Secondary | ICD-10-CM

## 2020-07-03 DIAGNOSIS — E669 Obesity, unspecified: Secondary | ICD-10-CM | POA: Diagnosis not present

## 2020-07-03 DIAGNOSIS — I152 Hypertension secondary to endocrine disorders: Secondary | ICD-10-CM

## 2020-07-03 DIAGNOSIS — Z0000 Encounter for general adult medical examination without abnormal findings: Secondary | ICD-10-CM

## 2020-07-03 LAB — MICROALBUMIN, URINE WAIVED
Creatinine, Urine Waived: 200 mg/dL (ref 10–300)
Microalb, Ur Waived: 10 mg/L (ref 0–19)
Microalb/Creat Ratio: <30 mg/g (ref <30)

## 2020-07-03 LAB — BAYER DCA HB A1C WAIVED: HB A1C (BAYER DCA - WAIVED): 7.5 % — ABNORMAL HIGH (ref <7.0)

## 2020-07-03 MED ORDER — HYDROCHLOROTHIAZIDE 25 MG PO TABS: 25.0000 mg | ORAL_TABLET | Freq: Every day | ORAL | 1 refills | Status: DC

## 2020-07-03 MED ORDER — GLIPIZIDE 5 MG PO TABS: 5.0000 mg | ORAL_TABLET | Freq: Two times a day (BID) | ORAL | 0 refills | Status: DC

## 2020-07-03 MED ORDER — SIMVASTATIN 40 MG PO TABS: 40.0000 mg | ORAL_TABLET | Freq: Every day | ORAL | 1 refills | Status: DC

## 2020-07-03 MED ORDER — KLOR-CON M20 20 MEQ PO TBCR: 20.0000 meq | EXTENDED_RELEASE_TABLET | Freq: Two times a day (BID) | ORAL | 1 refills | Status: DC

## 2020-07-03 MED ORDER — METOPROLOL SUCCINATE ER 25 MG PO TB24: 25.0000 mg | ORAL_TABLET | Freq: Every day | ORAL | 1 refills | Status: DC

## 2020-07-03 MED ORDER — OZEMPIC (0.25 OR 0.5 MG/DOSE) 2 MG/1.5ML ~~LOC~~ SOPN: 0.5000 mg | PEN_INJECTOR | SUBCUTANEOUS | 0 refills | Status: DC

## 2020-07-03 MED ORDER — AMLODIPINE BESYLATE 10 MG PO TABS: 10.0000 mg | ORAL_TABLET | Freq: Every day | ORAL | 1 refills | Status: DC

## 2020-07-03 NOTE — Progress Notes (Signed)
BP 136/85   Pulse 68   Temp 98 F (36.7 C)   Ht '5\' 6"'  (1.676 m)   Wt 214 lb 6.4 oz (97.3 kg)   LMP  (LMP Unknown)   SpO2 98%   BMI 34.61 kg/m    Subjective:    Patient ID: Hannah Calhoun, female    DOB: 1960/09/28, 60 y.o.   MRN: [id number]  Chief Complaint  Patient presents with  . Annual Exam    HPI: Hannah Calhoun is a 60 y.o. female presenting on 07/03/2020 for comprehensive medical examination. Current medical complaints include:none   HYPERTENSION / HYPERLIPIDEMIA  Satisfied with current treatment? yes Duration of hypertension: chronic BP monitoring frequency: rarely BP range: 110s/60s BP medication side effects: no Past BP meds: amlodipine and HCTZ, metoprolol Duration of hyperlipidemia: chronic Cholesterol medication side effects: no Cholesterol supplements: none Past cholesterol medications: simvastatin (zocor) Medication compliance: excellent compliance Aspirin: no, allergy Recent stressors: no Recurrent headaches: no Visual changes: no Palpitations: no Dyspnea: no Chest pain: no Lower extremity edema: no Dizzy/lightheaded: no   DIABETES  Hypoglycemic episodes:no Polydipsia/polyuria: no Visual disturbance: no Chest pain: no Paresthesias: no Glucose Monitoring: yes  Accucheck frequency: 2-3 times a week  Fasting glucose: 135 Taking Insulin?: no Retinal Examination: Up to Date, goes every October Foot Exam: Not up to Date Diabetic Education: Completed Pneumovax: Up to Date Influenza: Up to Date Aspirin: no, allergy  She currently lives with: husband Menopausal Symptoms: hot flashes, sweating  Depression Screen done today and results listed below:  Depression screen [facility] 2/9 07/03/2020 09/05/2019 04/28/2015 01/20/2015 09/10/2014  Decreased Interest 0 1 0 0 0  Down, Depressed, Hopeless 0 1 0 0 0  PHQ - 2 Score 0 2 0 0 0  Altered sleeping - 2 - - -  Tired, decreased energy - 2 - - -  Change in appetite - 0 - - -  Feeling bad or  failure about yourself  - 0 - - -  Trouble concentrating - 3 - - -  Moving slowly or fidgety/restless - 0 - - -  Suicidal thoughts - 0 - - -  PHQ-9 Score - 9 - - -    The patient does not have a history of falls. I did not complete a risk assessment for falls. A plan of care for falls was not documented.   Past Medical History:  Past Medical History:  Diagnosis Date  . Allergy   . Arthritis   . Chicken pox   . Depression   . Diabetes mellitus without complication ([place])   . Diverticulitis   . GERD (gastroesophageal reflux disease)   . History of colon polyps   . Hyperlipidemia   . Hypertension     Surgical History:  Past Surgical History:  Procedure Laterality Date  . ABDOMINAL HYSTERECTOMY  2009  . BREAST BIOPSY Right 1990   EXCISIONAL - NEG  . BREAST SURGERY Right 1990  . CHOLECYSTECTOMY    . KNEE ARTHROSCOPY  2010  . NASAL SEPTUM SURGERY  2010  . TONSILLECTOMY    . TOTAL HIP ARTHROPLASTY      Medications:  Current Outpatient Medications on File Prior to Visit  Medication Sig  . acetaminophen (TYLENOL) 500 MG tablet Take 650 mg by mouth 2 (two) times daily.  . Blood Glucose Monitoring Suppl (ONE TOUCH ULTRA MINI) W/DEVICE KIT   . Cholecalciferol 25 MCG (1000 UT) capsule Take by mouth daily.   . fluticasone (FLONASE) 50 MCG/ACT nasal spray  Place 1 spray into both nostrils daily.  Marland Kitchen gabapentin (NEURONTIN) 300 MG capsule TAKE 1 CAPSULE BY MOUTH THREE TIMES A DAY  . glucose blood test strip 1 each by Other route 3 (three) times daily.  . Multiple Vitamin (MULTI-VITAMIN) tablet Take by mouth.  Glory Rosebush DELICA LANCETS 85I MISC   . cyclobenzaprine (FLEXERIL) 5 MG tablet Take 5 mg by mouth 3 (three) times daily as needed for muscle spasms. (Patient not taking: Reported on 07/03/2020)   No current facility-administered medications on file prior to visit.    Allergies:  Allergies  Allergen Reactions  . Doxycycline Diarrhea and Nausea And Vomiting  . Nsaids Other  (See Comments)    Bleeding ulcer  . Other Other (See Comments)  . Sulfa Antibiotics   . Tolmetin     Other reaction(s): Other (See Comments) Bleeding ulcer  . Ace Inhibitors Other (See Comments) and Rash    Significant kidney insufficiency-  each time lisinopril restarted Significant AKI each time lisinopril restarted   . Aspirin Rash    Bleeding ulcers    Social History:  Social History   Socioeconomic History  . Marital status: Married    Spouse name: Not on file  . Number of children: Not on file  . Years of education: Not on file  . Highest education level: Not on file  Occupational History  . Not on file  Tobacco Use  . Smoking status: Former Smoker    Packs/day: 0.50    Years: 15.00    Pack years: 7.50    Quit date: 06/12/2005    Years since quitting: 15.0  . Smokeless tobacco: Never Used  . Tobacco comment: quit 2007  Vaping Use  . Vaping Use: Never used  Substance and Sexual Activity  . Alcohol use: Not Currently    Alcohol/week: 0.0 standard drinks  . Drug use: No  . Sexual activity: Not Currently  Other Topics Concern  . Not on file  Social History Narrative  . Not on file   Social Determinants of Health   Financial Resource Strain: Not on file  Food Insecurity: Not on file  Transportation Needs: Not on file  Physical Activity: Not on file  Stress: Not on file  Social Connections: Not on file  Intimate Partner Violence: Not on file   Social History   Tobacco Use  Smoking Status Former Smoker  . Packs/day: 0.50  . Years: 15.00  . Pack years: 7.50  . Quit date: 06/12/2005  . Years since quitting: 15.0  Smokeless Tobacco Never Used  Tobacco Comment   quit 2007   Social History   Substance and Sexual Activity  Alcohol Use Not Currently  . Alcohol/week: 0.0 standard drinks    Family History:  Family History  Problem Relation Age of Onset  . Alcohol abuse Mother   . Hyperlipidemia Mother   . Heart disease Mother   . Hypertension  Mother   . Heart attack Mother   . Alcohol abuse Father   . Hyperlipidemia Father   . Heart disease Father   . Hypertension Father   . Heart attack Father   . Alcohol abuse Maternal Aunt   . Hypertension Maternal Aunt   . Kidney disease Maternal Aunt   . Alcohol abuse Maternal Uncle   . Hypertension Maternal Uncle   . Alcohol abuse Paternal Aunt   . Birth defects Paternal Aunt        Colon, Breast, Lung  . Heart disease Paternal Aunt   .  Breast cancer Paternal Aunt   . Alcohol abuse Paternal Uncle   . Heart disease Paternal Uncle   . Stroke Maternal Grandmother   . Hypertension Maternal Grandmother   . Alcohol abuse Maternal Grandfather   . Heart disease Maternal Grandfather   . Hypertension Maternal Grandfather     Past medical history, surgical history, medications, allergies, family history and social history reviewed with patient today and changes made to appropriate areas of the chart.   Review of Systems  Constitutional: Negative for fever and malaise/fatigue.  HENT: Positive for tinnitus (intermittently, improving since covid).   Eyes: Negative.   Respiratory: Negative.   Cardiovascular: Negative for chest pain, palpitations and leg swelling.  Gastrointestinal: Negative.   Genitourinary: Negative.   Musculoskeletal: Positive for joint pain (bilateral knees) and myalgias (right arm if lifts anything heavy).  Skin: Negative.   Neurological: Negative.   Endo/Heme/Allergies: Negative.   Psychiatric/Behavioral: The patient is nervous/anxious.    All other ROS negative except what is listed above and in the HPI.      Objective:    BP 136/85   Pulse 68   Temp 98 F (36.7 C)   Ht '5\' 6"'  (1.676 m)   Wt 214 lb 6.4 oz (97.3 kg)   LMP  (LMP Unknown)   SpO2 98%   BMI 34.61 kg/m   Wt Readings from Last 3 Encounters:  07/03/20 214 lb 6.4 oz (97.3 kg)  05/20/20 220 lb (99.8 kg)  01/01/20 219 lb (99.3 kg)    Physical Exam Vitals and nursing note reviewed. Exam  conducted with a chaperone present.  Constitutional:      Appearance: Normal appearance. She is obese.  HENT:     Head: Normocephalic and atraumatic.     Right Ear: Tympanic membrane, ear canal and external ear normal.     Left Ear: Tympanic membrane, ear canal and external ear normal.     Nose: Nose normal.     Mouth/Throat:     Mouth: Mucous membranes are moist.     Pharynx: Oropharynx is clear.  Eyes:     Conjunctiva/sclera: Conjunctivae normal.  Cardiovascular:     Rate and Rhythm: Normal rate and regular rhythm.     Pulses: Normal pulses.     Heart sounds: Normal heart sounds.  Pulmonary:     Effort: Pulmonary effort is normal.     Breath sounds: Normal breath sounds.  Chest:  Breasts:     Right: Normal. No mass, skin change, tenderness or axillary adenopathy.     Left: Normal. No mass, skin change, tenderness or axillary adenopathy.    Abdominal:     General: Bowel sounds are normal.     Palpations: Abdomen is soft.     Tenderness: There is no abdominal tenderness.  Musculoskeletal:        General: Normal range of motion.     Cervical back: Normal range of motion.     Right lower leg: No edema.     Left lower leg: No edema.  Lymphadenopathy:     Upper Body:     Right upper body: No axillary adenopathy.     Left upper body: No axillary adenopathy.  Skin:    General: Skin is warm and dry.  Neurological:     General: No focal deficit present.     Mental Status: She is alert and oriented to person, place, and time.  Psychiatric:        Mood and Affect: Mood normal.  Behavior: Behavior normal.        Thought Content: Thought content normal.        Judgment: Judgment normal.    Diabetic Foot Exam - Simple   Simple Foot Form Diabetic Foot exam was performed with the following findings: Yes 07/03/2020 10:44 AM  Visual Inspection No deformities, no ulcerations, no other skin breakdown bilaterally: Yes Sensation Testing Intact to touch and monofilament  testing bilaterally: Yes Pulse Check Posterior Tibialis and Dorsalis pulse intact bilaterally: Yes Comments     Results for orders placed or performed in visit on 05/20/20  Measles/Mumps/Rubella Immunity  Result Value Ref Range   Rubella Antibodies, IGG 6.51 Immune >0.99 index   RUBEOLA AB, IGG >300.0 Immune >16.4 AU/mL   MUMPS ABS, IGG <9.0 (L) Immune >10.9 AU/mL      Assessment & Plan:   Problem List Items Addressed This Visit      Cardiovascular and Mediastinum   Hypertension associated with diabetes ([place])    Chronic, stable. Check CMP, CBC today. Continue current regimen. Refills sent to the pharmacy.      Relevant Medications   amLODipine (NORVASC) 10 MG tablet   glipiZIDE (GLUCOTROL) 5 MG tablet   metoprolol succinate (TOPROL-XL) 25 MG 24 hr tablet   hydrochlorothiazide (HYDRODIURIL) 25 MG tablet   simvastatin (ZOCOR) 40 MG tablet   Semaglutide,0.25 or 0.5MG/DOS, (OZEMPIC, 0.25 OR 0.5 MG/DOSE,) 2 MG/1.5ML SOPN   Other Relevant Orders   Comp Met (CMET)   CBC w/Diff   Microalbumin, Urine Waived   TSH     Endocrine   DM2 (diabetes mellitus, type 2) ([facility]) - Primary    A1C 7.5% today. She stated she was eating sweets and had 2 courses of prednisone recently. She will watch her diet and follow-up in 3 months for an A1C check. Discussed possibility of increasing ozempic. She stated metformin gave her diarrhea in the past and she did not tolerate it. Foot exam done today. She follows with opthalmology yearly. Urine microalbumin negative today. Unable to tolerate ACE-Inhibitor. Follow up in 3 months.       Relevant Medications   glipiZIDE (GLUCOTROL) 5 MG tablet   simvastatin (ZOCOR) 40 MG tablet   Semaglutide,0.25 or 0.5MG/DOS, (OZEMPIC, 0.25 OR 0.5 MG/DOSE,) 2 MG/1.5ML SOPN   Other Relevant Orders   Comp Met (CMET)   CBC w/Diff   Lipid Panel w/o Chol/HDL Ratio   Bayer DCA Hb A1c Waived   Microalbumin, Urine Waived     Other   Obesity (BMI 30-39.9)    Discussed  diet and exercise.       Relevant Medications   glipiZIDE (GLUCOTROL) 5 MG tablet   Semaglutide,0.25 or 0.5MG/DOS, (OZEMPIC, 0.25 OR 0.5 MG/DOSE,) 2 MG/1.5ML SOPN    Other Visit Diagnoses    Mixed hyperlipidemia       Chronic, stable. Continue current regimen. Check lipid panel. Refills sent to the pharmacy   Relevant Medications   amLODipine (NORVASC) 10 MG tablet   metoprolol succinate (TOPROL-XL) 25 MG 24 hr tablet   hydrochlorothiazide (HYDRODIURIL) 25 MG tablet   simvastatin (ZOCOR) 40 MG tablet   Other Relevant Orders   Lipid Panel w/o Chol/HDL Ratio   Encounter for annual wellness exam in Medicare patient       Annual visit today. Check CMP, CBC, and TSH. Health maintenance reviewed. She will check with her insurance regarding shingrix vaccine.   Relevant Orders   TSH   Vitamin D deficiency, unspecified  Check Vit D level today. Continue daily supplement   Relevant Orders   VITAMIN D 25 Hydroxy (Vit-D Deficiency, Fractures)       Follow up plan: Return in about 3 months (around 09/30/2020) for DM.   LABORATORY TESTING:  - Pap smear: not applicable, had hysterctomy  IMMUNIZATIONS:   - Tdap: Tetanus vaccination status reviewed: last tetanus booster within 10 years. - Influenza: Up to date - Pneumovax: Up to date - Prevnar: Not applicable - HPV: Not applicable - Zostavax vaccine: Not up to date.  SCREENING: -Mammogram: Up to date  - Colonoscopy: Up to date  - Bone Density: Not applicable  -Hearing Test: Not applicable  -Spirometry: Not applicable   PATIENT COUNSELING:   Advised to take 1 mg of folate supplement per day if capable of pregnancy.   Sexuality: Discussed sexually transmitted diseases, partner selection, use of condoms, avoidance of unintended pregnancy  and contraceptive alternatives.   Advised to avoid cigarette smoking.  I discussed with the patient that most people either abstain from alcohol or drink within safe limits (<=14/week and  <=4 drinks/occasion for males, <=7/weeks and <= 3 drinks/occasion for females) and that the risk for alcohol disorders and other health effects rises proportionally with the number of drinks per week and how often a drinker exceeds daily limits.  Discussed cessation/primary prevention of drug use and availability of treatment for abuse.   Diet: Encouraged to adjust caloric intake to maintain  or achieve ideal body weight, to reduce intake of dietary saturated fat and total fat, to limit sodium intake by avoiding high sodium foods and not adding table salt, and to maintain adequate dietary potassium and calcium preferably from fresh fruits, vegetables, and low-fat dairy products.    stressed the importance of regular exercise  Injury prevention: Discussed safety belts, safety helmets, smoke detector, smoking near bedding or upholstery.   Dental health: Discussed importance of regular tooth brushing, flossing, and dental visits.    NEXT PREVENTATIVE PHYSICAL DUE IN 1 YEAR. Return in about 3 months (around 09/30/2020) for DM.

## 2020-07-03 NOTE — Assessment & Plan Note (Signed)
Chronic, stable. Check CMP, CBC today. Continue current regimen. Refills sent to the pharmacy.

## 2020-07-03 NOTE — Assessment & Plan Note (Signed)
Discussed diet and exercise 

## 2020-07-03 NOTE — Assessment & Plan Note (Signed)
A1C 7.5% today. She stated she was eating sweets and had 2 courses of prednisone recently. She will watch her diet and follow-up in 3 months for an A1C check. Discussed possibility of increasing ozempic. She stated metformin gave her diarrhea in the past and she did not tolerate it. Foot exam done today. She follows with opthalmology yearly. Urine microalbumin negative today. Unable to tolerate ACE-Inhibitor. Follow up in 3 months.

## 2020-07-03 NOTE — Patient Instructions (Signed)
It was great to see you!  We completed your annual physical today. We checked some blood work and will let you know the results through my chart.   Let's follow-up again in 6 months or sooner if needed.   Take care,  Rodman Pickle, NP   Health Maintenance, Female Adopting a healthy lifestyle and getting preventive care are important in promoting health and wellness. Ask your health care provider about:  The right schedule for you to have regular tests and exams.  Things you can do on your own to prevent diseases and keep yourself healthy. What should I know about diet, weight, and exercise? Eat a healthy diet  Eat a diet that includes plenty of vegetables, fruits, low-fat dairy products, and lean protein.  Do not eat a lot of foods that are high in solid fats, added sugars, or sodium.   Maintain a healthy weight Body mass index (BMI) is used to identify weight problems. It estimates body fat based on height and weight. Your health care provider can help determine your BMI and help you achieve or maintain a healthy weight. Get regular exercise Get regular exercise. This is one of the most important things you can do for your health. Most adults should:  Exercise for at least 150 minutes each week. The exercise should increase your heart rate and make you sweat (moderate-intensity exercise).  Do strengthening exercises at least twice a week. This is in addition to the moderate-intensity exercise.  Spend less time sitting. Even light physical activity can be beneficial. Watch cholesterol and blood lipids Have your blood tested for lipids and cholesterol at 58 years of age, then have this test every 5 years. Have your cholesterol levels checked more often if:  Your lipid or cholesterol levels are high.  You are older than 58 years of age.  You are at high risk for heart disease. What should I know about cancer screening? Depending on your health history and family history,  you may need to have cancer screening at various ages. This may include screening for:  Breast cancer.  Cervical cancer.  Colorectal cancer.  Skin cancer.  Lung cancer. What should I know about heart disease, diabetes, and high blood pressure? Blood pressure and heart disease  High blood pressure causes heart disease and increases the risk of stroke. This is more likely to develop in people who have high blood pressure readings, are of African descent, or are overweight.  Have your blood pressure checked: ? Every 3-5 years if you are 67-15 years of age. ? Every year if you are 29 years old or older. Diabetes Have regular diabetes screenings. This checks your fasting blood sugar level. Have the screening done:  Once every three years after age 42 if you are at a normal weight and have a low risk for diabetes.  More often and at a younger age if you are overweight or have a high risk for diabetes. What should I know about preventing infection? Hepatitis B If you have a higher risk for hepatitis B, you should be screened for this virus. Talk with your health care provider to find out if you are at risk for hepatitis B infection. Hepatitis C Testing is recommended for:  Everyone born from [birth year] through 1963.  Anyone with known risk factors for hepatitis C. Sexually transmitted infections (STIs)  Get screened for STIs, including gonorrhea and chlamydia, if: ? You are sexually active and are younger than 58 years of age. ? You  are older than 58 years of age and your health care provider tells you that you are at risk for this type of infection. ? Your sexual activity has changed since you were last screened, and you are at increased risk for chlamydia or gonorrhea. Ask your health care provider if you are at risk.  Ask your health care provider about whether you are at high risk for HIV. Your health care provider may recommend a prescription medicine to help prevent HIV infection.  If you choose to take medicine to prevent HIV, you should first get tested for HIV. You should then be tested every 3 months for as long as you are taking the medicine. Pregnancy  If you are about to stop having your period (premenopausal) and you may become pregnant, seek counseling before you get pregnant.  Take 400 to 800 micrograms (mcg) of folic acid every day if you become pregnant.  Ask for birth control (contraception) if you want to prevent pregnancy. Osteoporosis and menopause Osteoporosis is a disease in which the bones lose minerals and strength with aging. This can result in bone fractures. If you are 17 years old or older, or if you are at risk for osteoporosis and fractures, ask your health care provider if you should:  Be screened for bone loss.  Take a calcium or vitamin D supplement to lower your risk of fractures.  Be given hormone replacement therapy (HRT) to treat symptoms of menopause. Follow these instructions at home: Lifestyle  Do not use any products that contain nicotine or tobacco, such as cigarettes, e-cigarettes, and chewing tobacco. If you need help quitting, ask your health care provider.  Do not use street drugs.  Do not share needles.  Ask your health care provider for help if you need support or information about quitting drugs. Alcohol use  Do not drink alcohol if: ? Your health care provider tells you not to drink. ? You are pregnant, may be pregnant, or are planning to become pregnant.  If you drink alcohol: ? Limit how much you use to 0-1 drink a day. ? Limit intake if you are breastfeeding.  Be aware of how much alcohol is in your drink. In the [place] one drink equals one 12 oz bottle of beer (355 mL), one 5 oz glass of wine (148 mL), or one 1 oz glass of hard liquor (44 mL). General instructions  Schedule regular health, dental, and eye exams.  Stay current with your vaccines.  Tell your health care provider if: ? You often feel  depressed. ? You have ever been abused or do not feel safe at home. Summary  Adopting a healthy lifestyle and getting preventive care are important in promoting health and wellness.  Follow your health care provider's instructions about healthy diet, exercising, and getting tested or screened for diseases.  Follow your health care provider's instructions on monitoring your cholesterol and blood pressure. This information is not intended to replace advice given to you by your health care provider. Make sure you discuss any questions you have with your health care provider. Document Revised: 04/18/2018 Document Reviewed: 04/18/2018 Elsevier Patient Education  2021 [employer].

## 2020-07-04 LAB — CBC WITH DIFFERENTIAL/PLATELET
Basophils Absolute: 0 10*3/uL (ref 0.0–0.2)
Basos: 0 %
EOS (ABSOLUTE): 0.1 10*3/uL (ref 0.0–0.4)
Eos: 1 %
Hematocrit: 41.3 % (ref 34.0–46.6)
Hemoglobin: 13.9 g/dL (ref 11.1–15.9)
Immature Grans (Abs): 0 10*3/uL (ref 0.0–0.1)
Immature Granulocytes: 0 %
Lymphocytes Absolute: 3.5 10*3/uL — ABNORMAL HIGH (ref 0.7–3.1)
Lymphs: 38 %
MCH: 31 pg (ref 26.6–33.0)
MCHC: 33.7 g/dL (ref 31.5–35.7)
MCV: 92 fL (ref 79–97)
Monocytes Absolute: 0.5 10*3/uL (ref 0.1–0.9)
Monocytes: 5 %
Neutrophils Absolute: 5.2 10*3/uL (ref 1.4–7.0)
Neutrophils: 56 %
Platelets: 394 10*3/uL (ref 150–450)
RBC: 4.48 x10E6/uL (ref 3.77–5.28)
RDW: 12.8 % (ref 11.7–15.4)
WBC: 9.2 10*3/uL (ref 3.4–10.8)

## 2020-07-04 LAB — COMPREHENSIVE METABOLIC PANEL
ALT: 23 IU/L (ref 0–32)
AST: 22 IU/L (ref 0–40)
Albumin/Globulin Ratio: 1.7 (ref 1.2–2.2)
Albumin: 4.5 g/dL (ref 3.8–4.9)
Alkaline Phosphatase: 60 IU/L (ref 44–121)
BUN/Creatinine Ratio: 13 (ref 9–23)
BUN: 12 mg/dL (ref 6–24)
Bilirubin Total: 0.4 mg/dL (ref 0.0–1.2)
CO2: 22 mmol/L (ref 20–29)
Calcium: 9.5 mg/dL (ref 8.7–10.2)
Chloride: 103 mmol/L (ref 96–106)
Creatinine, Ser: 0.91 mg/dL (ref 0.57–1.00)
GFR calc Af Amer: 81 mL/min/{1.73_m2} (ref >59)
GFR calc non Af Amer: 70 mL/min/{1.73_m2} (ref >59)
Globulin, Total: 2.6 g/dL (ref 1.5–4.5)
Glucose: 140 mg/dL — ABNORMAL HIGH (ref 65–99)
Potassium: 3.8 mmol/L (ref 3.5–5.2)
Sodium: 143 mmol/L (ref 134–144)
Total Protein: 7.1 g/dL (ref 6.0–8.5)

## 2020-07-04 LAB — TSH: TSH: 2.61 u[IU]/mL (ref 0.450–4.500)

## 2020-07-04 LAB — LIPID PANEL W/O CHOL/HDL RATIO
Cholesterol, Total: 156 mg/dL (ref 100–199)
HDL: 54 mg/dL (ref >39)
LDL Chol Calc (NIH): 81 mg/dL (ref 0–99)
Triglycerides: 119 mg/dL (ref 0–149)
VLDL Cholesterol Cal: 21 mg/dL (ref 5–40)

## 2020-07-04 LAB — VITAMIN D 25 HYDROXY (VIT D DEFICIENCY, FRACTURES): Vit D, 25-Hydroxy: 38.9 ng/mL (ref 30.0–100.0)

## 2020-08-06 ENCOUNTER — Other Ambulatory Visit: Payer: Self-pay | Admitting: Family Medicine

## 2020-08-06 ENCOUNTER — Other Ambulatory Visit: Payer: Self-pay | Admitting: Nurse Practitioner

## 2020-08-28 MED ORDER — GLIPIZIDE 5 MG PO TABS: 5.0000 mg | ORAL_TABLET | Freq: Two times a day (BID) | ORAL | 0 refills | Status: DC

## 2020-09-08 ENCOUNTER — Ambulatory Visit: Payer: Self-pay | Admitting: Podiatry

## 2020-09-17 ENCOUNTER — Telehealth: Payer: Self-pay

## 2020-09-17 NOTE — Telephone Encounter (Signed)
Called pt advised of combo visit for 2/25 and explained how process works. Pt states that she understood that part and she told the provider that she just wanted to do cpe but the provider asked her questions and went over things. Call was transferred to [facility].  Copied from CRM [id number]. Topic: General - Other >> Sep 17, 2020  2:36 PM Herby Abraham C wrote: Reason for CRM: pt called in for assistance. Pt says that she received a bill for 25.00 for her ov visit on DOS 07/03/20. Pt says that visit was suppose to be her cpe.  Pt is not sure why she is receiving a bill.   CB: [phone] -- please assist pt further.

## 2020-09-29 NOTE — Progress Notes (Signed)
BP 117/72   Pulse 67   Temp 98.9 F (37.2 C) (Oral)   Ht 5' 5.5" (1.664 m)   Wt 214 lb (97.1 kg)   LMP  (LMP Unknown)   SpO2 99%   BMI 35.07 kg/m    Subjective:    Patient ID: Hannah Calhoun, female    DOB: 03/30/1961, 60 y.o.   MRN: [id number]  HPI: Hannah Calhoun is a 60 y.o. female  Chief Complaint  Patient presents with  . Diabetes    Eye exam report requested from Dr. Gloriann Loan via [place]   . Hyperlipidemia  . Hypertension   HYPERTENSION / HYPERLIPIDEMIA Satisfied with current treatment? no Duration of hypertension: years BP monitoring frequency: occassionaly BP range: 120/80 BP medication side effects: no Past BP meds: metoprolol, amlodipine and HCTZ Duration of hyperlipidemia: years Cholesterol medication side effects: yes leg cramps Cholesterol supplements: none Past cholesterol medications: simvastatin (zocor) Medication compliance: excellent compliance Aspirin: no Recent stressors: no Recurrent headaches: no Visual changes: no Palpitations: no Dyspnea: no Chest pain: no Lower extremity edema: no Dizzy/lightheaded: no  DIABETES Hypoglycemic episodes:no Polydipsia/polyuria: no Visual disturbance: no Chest pain: no Paresthesias: yes Glucose Monitoring: yes  Accucheck frequency: Daily  Fasting glucose: 120  Post prandial:  Evening:  Before meals: Taking Insulin?: no  Long acting insulin:  Short acting insulin: Blood Pressure Monitoring: weekly Retinal Examination: Up to Date Foot Exam: Up to Date Diabetic Education: Not Completed Pneumovax: Up to Date Influenza: Up to Date Aspirin: no  ANXIETY/DEPRESSION Patient feels like her mood is well controlled.  Denies concerns about medications.  Denies SI.     Relevant past medical, surgical, family and social history reviewed and updated as indicated. Interim medical history since our last visit reviewed. Allergies and medications reviewed and updated.  Review of Systems  Eyes:  Negative for visual disturbance.  Respiratory: Negative for cough, chest tightness and shortness of breath.   Cardiovascular: Negative for chest pain, palpitations and leg swelling.  Musculoskeletal:       Leg cramps  Neurological: Negative for dizziness and headaches.  Psychiatric/Behavioral: Negative for dysphoric mood and suicidal ideas. The patient is not nervous/anxious.     Per HPI unless specifically indicated above     Objective:    BP 117/72   Pulse 67   Temp 98.9 F (37.2 C) (Oral)   Ht 5' 5.5" (1.664 m)   Wt 214 lb (97.1 kg)   LMP  (LMP Unknown)   SpO2 99%   BMI 35.07 kg/m   Wt Readings from Last 3 Encounters:  09/30/20 214 lb (97.1 kg)  07/03/20 214 lb 6.4 oz (97.3 kg)  05/20/20 220 lb (99.8 kg)    Physical Exam Vitals and nursing note reviewed.  Constitutional:      General: She is not in acute distress.    Appearance: Normal appearance. She is normal weight. She is not ill-appearing, toxic-appearing or diaphoretic.  HENT:     Head: Normocephalic.     Right Ear: External ear normal.     Left Ear: External ear normal.     Nose: Nose normal.     Mouth/Throat:     Mouth: Mucous membranes are moist.     Pharynx: Oropharynx is clear.  Eyes:     General:        Right eye: No discharge.        Left eye: No discharge.     Extraocular Movements: Extraocular movements intact.     Conjunctiva/sclera:  Conjunctivae normal.     Pupils: Pupils are equal, round, and reactive to light.  Cardiovascular:     Rate and Rhythm: Normal rate and regular rhythm.     Heart sounds: No murmur heard.   Pulmonary:     Effort: Pulmonary effort is normal. No respiratory distress.     Breath sounds: Normal breath sounds. No wheezing or rales.  Musculoskeletal:     Cervical back: Normal range of motion and neck supple.  Skin:    General: Skin is warm and dry.     Capillary Refill: Capillary refill takes less than 2 seconds.  Neurological:     General: No focal deficit  present.     Mental Status: She is alert and oriented to person, place, and time. Mental status is at baseline.  Psychiatric:        Mood and Affect: Mood normal.        Behavior: Behavior normal.        Thought Content: Thought content normal.        Judgment: Judgment normal.     Results for orders placed or performed in visit on 07/03/20  Comp Met (CMET)  Result Value Ref Range   Glucose 140 (H) 65 - 99 mg/dL   BUN 12 6 - 24 mg/dL   Creatinine, Ser 0.91 0.57 - 1.00 mg/dL   GFR calc non Af Amer 70 >59 mL/min/1.73   GFR calc Af Amer 81 >59 mL/min/1.73   BUN/Creatinine Ratio 13 9 - 23   Sodium 143 134 - 144 mmol/L   Potassium 3.8 3.5 - 5.2 mmol/L   Chloride 103 96 - 106 mmol/L   CO2 22 20 - 29 mmol/L   Calcium 9.5 8.7 - 10.2 mg/dL   Total Protein 7.1 6.0 - 8.5 g/dL   Albumin 4.5 3.8 - 4.9 g/dL   Globulin, Total 2.6 1.5 - 4.5 g/dL   Albumin/Globulin Ratio 1.7 1.2 - 2.2   Bilirubin Total 0.4 0.0 - 1.2 mg/dL   Alkaline Phosphatase 60 44 - 121 IU/L   AST 22 0 - 40 IU/L   ALT 23 0 - 32 IU/L  CBC w/Diff  Result Value Ref Range   WBC 9.2 3.4 - 10.8 x10E3/uL   RBC 4.48 3.77 - 5.28 x10E6/uL   Hemoglobin 13.9 11.1 - 15.9 g/dL   Hematocrit 41.3 34.0 - 46.6 %   MCV 92 79 - 97 fL   MCH 31.0 26.6 - 33.0 pg   MCHC 33.7 31.5 - 35.7 g/dL   RDW 12.8 11.7 - 15.4 %   Platelets 394 150 - 450 x10E3/uL   Neutrophils 56 Not Estab. %   Lymphs 38 Not Estab. %   Monocytes 5 Not Estab. %   Eos 1 Not Estab. %   Basos 0 Not Estab. %   Neutrophils Absolute 5.2 1.4 - 7.0 x10E3/uL   Lymphocytes Absolute 3.5 (H) 0.7 - 3.1 x10E3/uL   Monocytes Absolute 0.5 0.1 - 0.9 x10E3/uL   EOS (ABSOLUTE) 0.1 0.0 - 0.4 x10E3/uL   Basophils Absolute 0.0 0.0 - 0.2 x10E3/uL   Immature Granulocytes 0 Not Estab. %   Immature Grans (Abs) 0.0 0.0 - 0.1 x10E3/uL  Lipid Panel w/o Chol/HDL Ratio  Result Value Ref Range   Cholesterol, Total 156 100 - 199 mg/dL   Triglycerides 119 0 - 149 mg/dL   HDL 54 >39 mg/dL    VLDL Cholesterol Cal 21 5 - 40 mg/dL   LDL Chol Calc (NIH) 81 0 -  99 mg/dL  Bayer DCA Hb A1c Waived  Result Value Ref Range   HB A1C (BAYER DCA - WAIVED) 7.5 (H) <7.0 %  Microalbumin, Urine Waived  Result Value Ref Range   Microalb, Ur Waived 10 0 - 19 mg/L   Creatinine, Urine Waived 200 10 - 300 mg/dL   Microalb/Creat Ratio <30 <30 mg/g  TSH  Result Value Ref Range   TSH 2.610 0.450 - 4.500 uIU/mL  VITAMIN D 25 Hydroxy (Vit-D Deficiency, Fractures)  Result Value Ref Range   Vit D, 25-Hydroxy 38.9 30.0 - 100.0 ng/mL      Assessment & Plan:   Problem List Items Addressed This Visit      Cardiovascular and Mediastinum   Hypertension associated with diabetes ([place]) - Primary    Chronic, stable. Labs ordered today. Continue current regimen.       Relevant Medications   rosuvastatin (CRESTOR) 5 MG tablet   Other Relevant Orders   HgB A1c   AMB Referral to [facility] Coordinaton     Endocrine   DM2 (diabetes mellitus, type 2) (HCC)    Chronic.  Ongoing.  Labs ordered today. Patient is not able to afford Ozempic due to being in a donut hole with Medicare.  Referral placed to CCM for medication assistance.  Patient will continue Ozempic this month while we work with our Pharmacist to help her with medications.  Will obtain Diabetic Eye exam report.  Continue with current medication regimen.  Will make further recommendations based on lab results.  Follow up in 3 months.       Relevant Medications   rosuvastatin (CRESTOR) 5 MG tablet   Other Relevant Orders   Basic metabolic panel   HgB Q2V   AMB Referral to [facility]   Hyperlipidemia associated with type 2 diabetes mellitus (HCC)    Chronic.  Patient having leg cramps with Simvastatin.  Changed statin to Crestor to see if patient tolerates the medication better. Labs ordered today.  Follow up in 3 months.      Relevant Medications   rosuvastatin (CRESTOR) 5 MG tablet   Other Relevant Orders   Lipid panel    AMB Referral to Community Care Coordinaton     Other   Anxiety and depression    Currently stable off celexa, will continue to monitor.      Vitamin D deficiency    Labs ordered today.  Will make recommendations based on lab results.       Relevant Orders   Vitamin D (25 hydroxy)       Follow up plan: Return in about 3 months (around 12/31/2020) for HTN, HLD, DM2 FU.

## 2020-09-30 ENCOUNTER — Encounter: Payer: Self-pay | Admitting: Nurse Practitioner

## 2020-09-30 ENCOUNTER — Other Ambulatory Visit: Payer: Self-pay

## 2020-09-30 ENCOUNTER — Telehealth: Payer: Self-pay

## 2020-09-30 ENCOUNTER — Ambulatory Visit: Payer: Medicare (Managed Care) | Admitting: Nurse Practitioner

## 2020-09-30 VITALS — BP 117/72 | HR 67 | Temp 98.9°F | Ht 65.5 in | Wt 214.0 lb

## 2020-09-30 DIAGNOSIS — E1159 Type 2 diabetes mellitus with other circulatory complications: Secondary | ICD-10-CM | POA: Diagnosis not present

## 2020-09-30 DIAGNOSIS — E785 Hyperlipidemia, unspecified: Secondary | ICD-10-CM

## 2020-09-30 DIAGNOSIS — E119 Type 2 diabetes mellitus without complications: Secondary | ICD-10-CM

## 2020-09-30 DIAGNOSIS — F419 Anxiety disorder, unspecified: Secondary | ICD-10-CM | POA: Diagnosis not present

## 2020-09-30 DIAGNOSIS — E1169 Type 2 diabetes mellitus with other specified complication: Secondary | ICD-10-CM | POA: Diagnosis not present

## 2020-09-30 DIAGNOSIS — I152 Hypertension secondary to endocrine disorders: Secondary | ICD-10-CM

## 2020-09-30 DIAGNOSIS — E559 Vitamin D deficiency, unspecified: Secondary | ICD-10-CM

## 2020-09-30 DIAGNOSIS — F32A Depression, unspecified: Secondary | ICD-10-CM

## 2020-09-30 MED ORDER — ROSUVASTATIN CALCIUM 5 MG PO TABS: 5.0000 mg | ORAL_TABLET | Freq: Every day | ORAL | 1 refills | Status: DC

## 2020-09-30 NOTE — Assessment & Plan Note (Signed)
Currently stable off celexa, will continue to monitor.

## 2020-09-30 NOTE — Assessment & Plan Note (Signed)
Chronic.  Ongoing.  Labs ordered today. Patient is not able to afford Ozempic due to being in a donut hole with Medicare.  Referral placed to CCM for medication assistance.  Patient will continue Ozempic this month while we work with our Pharmacist to help her with medications.  Will obtain Diabetic Eye exam report.  Continue with current medication regimen.  Will make further recommendations based on lab results.  Follow up in 3 months.

## 2020-09-30 NOTE — Assessment & Plan Note (Signed)
Chronic, stable. Labs ordered today. Continue current regimen.

## 2020-09-30 NOTE — Assessment & Plan Note (Signed)
Chronic.  Patient having leg cramps with Simvastatin.  Changed statin to Crestor to see if patient tolerates the medication better. Labs ordered today.  Follow up in 3 months.

## 2020-09-30 NOTE — Assessment & Plan Note (Signed)
Labs ordered today.  Will make recommendations based on lab results. ?

## 2020-09-30 NOTE — Chronic Care Management (AMB) (Signed)
  Chronic Care Management   Note  09/30/2020 Name: Hannah Calhoun MRN: [id number] DOB: [birth year]  [name] is a 61 y.o. year old female who is a primary care patient of Jon Billings, NP. I reached out to [facility] by phone today in response to a referral sent by [name]'s PCP, Jon Billings, NP     [name] was given information about Chronic Care Management services today including:  1. CCM service includes personalized support from designated clinical staff supervised by her physician, including individualized plan of care and coordination with other care providers 2. 24/7 contact phone numbers for assistance for urgent and routine care needs. 3. Service will only be billed when office clinical staff spend 20 minutes or more in a month to coordinate care. 4. Only one practitioner may furnish and bill the service in a calendar month. 5. The patient may stop CCM services at any time (effective at the end of the month) by phone call to the office staff. 6. The patient will be responsible for cost sharing (co-pay) of up to 20% of the service fee (after annual deductible is met).  Patient agreed to services and verbal consent obtained.   Follow up plan: Telephone appointment with care management team member scheduled for:10/23/2020  Noreene Larsson, [address], [place], [place] [address] Direct Dial: [phone] [email or web address]

## 2020-10-01 LAB — BASIC METABOLIC PANEL
BUN/Creatinine Ratio: 13 (ref 9–23)
BUN: 12 mg/dL (ref 6–24)
CO2: 26 mmol/L (ref 20–29)
Calcium: 9 mg/dL (ref 8.7–10.2)
Chloride: 106 mmol/L (ref 96–106)
Creatinine, Ser: 0.94 mg/dL (ref 0.57–1.00)
Glucose: 112 mg/dL — ABNORMAL HIGH (ref 65–99)
Potassium: 3.7 mmol/L (ref 3.5–5.2)
Sodium: 146 mmol/L — ABNORMAL HIGH (ref 134–144)
eGFR: 71 mL/min/{1.73_m2} (ref >59)

## 2020-10-01 LAB — LIPID PANEL
Chol/HDL Ratio: 3.3 ratio (ref 0.0–4.4)
Cholesterol, Total: 161 mg/dL (ref 100–199)
HDL: 49 mg/dL (ref >39)
LDL Chol Calc (NIH): 96 mg/dL (ref 0–99)
Triglycerides: 87 mg/dL (ref 0–149)
VLDL Cholesterol Cal: 16 mg/dL (ref 5–40)

## 2020-10-01 LAB — HEMOGLOBIN A1C
Est. average glucose Bld gHb Est-mCnc: 143 mg/dL
Hgb A1c MFr Bld: 6.6 % — ABNORMAL HIGH (ref 4.8–5.6)

## 2020-10-01 LAB — VITAMIN D 25 HYDROXY (VIT D DEFICIENCY, FRACTURES): Vit D, 25-Hydroxy: 38.5 ng/mL (ref 30.0–100.0)

## 2020-10-01 NOTE — Progress Notes (Signed)
Hi Ms. Hannah Calhoun.  It was a pleasure meeting you yesterday.  Your lab work looks great. Cholesterol remains well controlled. Liver, Kidneys, and electrolytes look great. A1c remains 6.6.  Keep up the great work and I will see you at our next visit.

## 2020-10-06 ENCOUNTER — Other Ambulatory Visit: Payer: Self-pay | Admitting: Nurse Practitioner

## 2020-10-15 ENCOUNTER — Other Ambulatory Visit: Payer: Self-pay | Admitting: Family Medicine

## 2020-10-23 ENCOUNTER — Telehealth: Payer: Self-pay | Admitting: Pharmacist

## 2020-10-23 ENCOUNTER — Telehealth: Payer: Medicare (Managed Care)

## 2020-10-23 NOTE — Progress Notes (Deleted)
Chronic Care Management Pharmacy Note  10/23/2020 Name:  Hannah Calhoun MRN:  [id number] DOB:  December 21, 1960  Summary:   Recommendations/Changes made from today's visit:   Plan:    Subjective: Hannah Calhoun is an 60 y.o. year old female who is a primary patient of Jon Billings, NP.  The CCM team was consulted for assistance with disease management and care coordination needs.    Engaged with patient by telephone for initial visit in response to provider referral for pharmacy case management and/or care coordination services.   Consent to Services:  The patient was given the following information about Chronic Care Management services today, agreed to services, and gave verbal consent: 1. CCM service includes personalized support from designated clinical staff supervised by the primary care provider, including individualized plan of care and coordination with other care providers 2. 24/7 contact phone numbers for assistance for urgent and routine care needs. 3. Service will only be billed when office clinical staff spend 20 minutes or more in a month to coordinate care. 4. Only one practitioner may furnish and bill the service in a calendar month. 5.The patient may stop CCM services at any time (effective at the end of the month) by phone call to the office staff. 6. The patient will be responsible for cost sharing (co-pay) of up to 20% of the service fee (after annual deductible is met). Patient agreed to services and consent obtained.  Patient Care Team: Jon Billings, NP as PCP - [facility] Junita Push, [facility] (Pharmacist)  Recent office visits: 09/30/20-[place](PCP) blooodwork, rosuvastatin 5 mg qd  Recent consult visits:   Hospital visits: {Hospital DC Yes/No:25215}   Objective:  Lab Results  Component Value Date   CREATININE 0.94 09/30/2020   BUN 12 09/30/2020   GFR 72.35 04/28/2015   GFRNONAA 70 07/03/2020   GFRAA 81 07/03/2020   NA 146 (H)  09/30/2020   K 3.7 09/30/2020   CALCIUM 9.0 09/30/2020   CO2 26 09/30/2020   GLUCOSE 112 (H) 09/30/2020    Lab Results  Component Value Date/Time   HGBA1C 6.6 (H) 09/30/2020 09:04 AM   HGBA1C 7.5 (H) 07/03/2020 09:54 AM   HGBA1C 6.6 (H) 01/01/2020 02:55 PM   GFR 72.35 04/28/2015 01:46 PM   GFR 88.14 09/01/2014 10:49 AM   MICROALBUR 10 07/03/2020 09:54 AM   MICROALBUR <0.7 09/10/2014 01:46 PM    Last diabetic Eye exam:  Lab Results  Component Value Date/Time   HMDIABEYEEXA No Retinopathy 12/25/2019 12:00 AM    Last diabetic Foot exam:  Lab Results  Component Value Date/Time   HMDIABFOOTEX [facility] 06/11/2013 12:00 AM     Lab Results  Component Value Date   CHOL 161 09/30/2020   HDL 49 09/30/2020   LDLCALC 96 09/30/2020   TRIG 87 09/30/2020   CHOLHDL 3.3 09/30/2020    Hepatic Function Latest Ref Rng & Units 07/03/2020 01/01/2020 09/05/2019  Total Protein 6.0 - 8.5 g/dL 7.1 6.9 7.3  Albumin 3.8 - 4.9 g/dL 4.5 4.4 4.8  AST 0 - 40 IU/L '22 23 24  ' ALT 0 - 32 IU/L '23 28 26  ' Alk Phosphatase 44 - 121 IU/L 60 72 64  Total Bilirubin 0.0 - 1.2 mg/dL 0.4 <0.2 <0.2    Lab Results  Component Value Date/Time   TSH 2.610 07/03/2020 10:42 AM   TSH 3.04 05/25/2012 08:34 PM    CBC Latest Ref Rng & Units 07/03/2020 04/03/2019 08/31/2015  WBC 3.4 - 10.8 x10E3/uL 9.2 12.5(H)  9.0  Hemoglobin 11.1 - 15.9 g/dL 13.9 13.5 13.9  Hematocrit 34.0 - 46.6 % 41.3 39.7 40.3  Platelets 150 - 450 x10E3/uL 394 414(H) 357    Lab Results  Component Value Date/Time   VD25OH 38.5 09/30/2020 09:04 AM   VD25OH 38.9 07/03/2020 10:42 AM    Clinical ASCVD: {YES/NO:21197} The 10-year ASCVD risk score Mikey Bussing DC Jr., et al., 2013) is: 8.9%   Values used to calculate the score:     Age: 33 years     Sex: Female     Is Non-Hispanic African American: Yes     Diabetic: Yes     Tobacco smoker: No     Systolic Blood Pressure: 364 mmHg     Is BP treated: Yes     HDL Cholesterol: 49 mg/dL     Total  Cholesterol: 161 mg/dL    Depression screen [facility] 2/9 07/03/2020 09/05/2019 04/28/2015  Decreased Interest 0 1 0  Down, Depressed, Hopeless 0 1 0  PHQ - 2 Score 0 2 0  Altered sleeping - 2 -  Tired, decreased energy - 2 -  Change in appetite - 0 -  Feeling bad or failure about yourself  - 0 -  Trouble concentrating - 3 -  Moving slowly or fidgety/restless - 0 -  Suicidal thoughts - 0 -  PHQ-9 Score - 9 -     ***Other: (CHADS2VASc if Afib, MMRC or CAT for COPD, ACT, DEXA)  Social History   Tobacco Use  Smoking Status Former   Packs/day: 0.50   Years: 15.00   Pack years: 7.50   Types: Cigarettes   Quit date: 06/12/2005   Years since quitting: 15.3  Smokeless Tobacco Never  Tobacco Comments   quit 2007   BP Readings from Last 3 Encounters:  09/30/20 117/72  07/03/20 136/85  05/20/20 (!) 148/85   Pulse Readings from Last 3 Encounters:  09/30/20 67  07/03/20 68  05/20/20 68   Wt Readings from Last 3 Encounters:  09/30/20 214 lb (97.1 kg)  07/03/20 214 lb 6.4 oz (97.3 kg)  05/20/20 220 lb (99.8 kg)   BMI Readings from Last 3 Encounters:  09/30/20 35.07 kg/m  07/03/20 34.61 kg/m  05/20/20 36.61 kg/m    Assessment/Interventions: Review of patient past medical history, allergies, medications, health status, including review of consultants reports, laboratory and other test data, was performed as part of comprehensive evaluation and provision of chronic care management services.   SDOH:  (Social Determinants of Health) assessments and interventions performed: {yes/no:20286}  SDOH Screenings   Alcohol Screen: Not on file  Depression (PHQ2-9): Low Risk    PHQ-2 Score: 0  Financial Resource Strain: Not on file  Food Insecurity: Not on file  Housing: Not on file  Physical Activity: Not on file  Social Connections: Not on file  Stress: Not on file  Tobacco Use: Medium Risk   Smoking Tobacco Use: Former   Smokeless Tobacco Use: Never  Transportation Needs: Not on  file     Immunization History  Administered Date(s) Administered   Influenza Split 02/28/2014   Influenza,inj,Quad PF,6+ Mos 01/20/2015, 01/13/2016, 02/23/2017, 03/02/2018, 01/21/2019   Influenza-Unspecified 01/13/2016, 02/23/2017, 03/02/2018, 01/21/2019, 04/17/2020   Moderna Sars-Covid-2 Vaccination 03/03/2020   PFIZER(Purple Top)SARS-COV-2 Vaccination 08/05/2019, 08/27/2019   PPD Test 05/15/2020   Pneumococcal Conjugate-13 01/20/2015   Pneumococcal Polysaccharide-23 12/25/2015   Tdap 03/11/2014    Conditions to be addressed/monitored:  Hypertension, Hyperlipidemia, Diabetes, GERD, Depression, Anxiety, and Obesity  There are no care  plans that you recently modified to display for this patient.    Medication Assistance: {MEDASSISTANCEINFO:25044}  Compliance/Adherence/Medication fill history: Care Gaps:   Star-Rating Drugs:   Patient's preferred pharmacy is:  [id number] [address] [place] [id number]: [phone]: [phone] Uses pill box? {Yes or If no, why not?:20788} Pt endorses ***% compliance  We discussed: {Pharmacy options:24294} Patient decided to: {US Pharmacy Plan:23885}  Care Plan and Follow Up Patient Decision:  {FOLLOWUP:24991}  Plan: {CM FOLLOW UP PTDHR:41638}   Current Barriers:  {pharmacybarriers:24917}  Pharmacist Clinical Goal(s):  Patient will {PHARMACYGOALCHOICES:24921} through collaboration with PharmD and provider.   Interventions: 1:1 collaboration with HJon Billings NP regarding development and update of comprehensive plan of care as evidenced by provider attestation and co-signature Inter-disciplinary care team collaboration (see longitudinal plan of care) Comprehensive medication review performed; medication list updated in electronic medical record BP Readings from Last 3 Encounters:  09/30/20 117/72  07/03/20 136/85  05/20/20 (!) 148/85    Hypertension (BP  goal <130/80) -Controlled -Current treatment: Amlodipine 10 mg qd HCTZ 25 mg qd Metoprolol succinate 25 mg qd -Medications previously tried: ***  -Current home readings: *** -Current dietary habits: *** -Current exercise habits: *** -{ACTIONS;DENIES/REPORTS:21021675} hypotensive/hypertensive symptoms -Educated on {CCM BP Counseling:25124} -Counseled to monitor BP at home ***, document, and provide log at future appointments -{CCMPHARMDINTERVENTION:25122}  Hyperlipidemia: (LDL goal < ***) -{US controlled/uncontrolled:25276} -Current treatment:  -Medications previously tried: ***  -Current dietary patterns: *** -Current exercise habits: *** -Educated on {CCM HLD Counseling:25126} -{CCMPHARMDINTERVENTION:25122}  Diabetes (A1c goal <7%) -Controlled -Current medications: Glipizide -Medications previously tried: ***  -Current home glucose readings fasting glucose: *** post prandial glucose: *** -{ACTIONS;DENIES/REPORTS:21021675} hypoglycemic/hyperglycemic symptoms -Current meal patterns:  breakfast: ***  lunch: ***  dinner: *** snacks: *** drinks: *** -Current exercise: *** -Educated on {CCM DM COUNSELING:25123} -Counseled to check feet daily and get yearly eye exams -{CCMPHARMDINTERVENTION:25122}  Depression/Anxiety (Goal: ***) -{US controlled/uncontrolled:25276} -Current treatment:  -Medications previously tried/failed: *** -PHQ9: *** -GAD7: *** -Connected with *** for mental health support -Educated on {CCM mental health counseling:25127} -{CCMPHARMDINTERVENTION:25122}  *** (Goal: ***) -{US controlled/uncontrolled:25276} -Current treatment   -Medications previously tried: ***  -{CCMPHARMDINTERVENTION:25122}  Health Maintenance -Vaccine gaps: *** -Current therapy:   -Educated on {ccm supplement counseling:25128} -{CCM Patient satisfied:25129} -{CCMPHARMDINTERVENTION:25122}  Patient Goals/Self-Care Activities Patient will:  -  {pharmacypatientgoals:24919}  Follow Up Plan: {CM FOLLOW UP PGTXM:46803}

## 2020-10-26 NOTE — Telephone Encounter (Signed)
  Chronic Care Management   Outreach Note  10/26/2020 Name: Rebeka Kimble MRN: [id number] DOB: 10/03/1960  Referred by: Larae Grooms, NP Reason for referral : No chief complaint on file.   An unsuccessful telephone outreach was attempted today. The patient was referred to the pharmacist for assistance with care management and care coordination.   Follow Up Plan: Will collaborate with care guide for reschedule.  Mercer Pod. Tiburcio Pea PharmD, BCPS Clinical Pharmacist [facility] [phone]

## 2020-10-29 ENCOUNTER — Other Ambulatory Visit: Payer: Self-pay

## 2020-10-29 DIAGNOSIS — Z1231 Encounter for screening mammogram for malignant neoplasm of breast: Secondary | ICD-10-CM

## 2020-11-04 ENCOUNTER — Telehealth: Payer: Self-pay | Admitting: Pharmacist

## 2020-11-04 NOTE — Chronic Care Management (AMB) (Signed)
    Chronic Care Management Pharmacy Assistant   Name: Kyllie Pettijohn  MRN: [id number] DOB: 10-30-1960   Reason for Encounter: Chart Review    Medications: Outpatient Encounter Medications as of 11/04/2020  Medication Sig Note   acetaminophen (TYLENOL) 500 MG tablet Take 650 mg by mouth 2 (two) times daily.    amLODipine (NORVASC) 10 MG tablet Take 1 tablet (10 mg total) by mouth daily.    Blood Glucose Monitoring Suppl (ONE TOUCH ULTRA MINI) W/DEVICE KIT  06/05/2014: Received from: [facility]   Cholecalciferol 25 MCG (1000 UT) capsule Take by mouth daily.     fluticasone (FLONASE) 50 MCG/ACT nasal spray PLACE 1 SPRAY INTO BOTH NOSTRILS DAILY.    gabapentin (NEURONTIN) 300 MG capsule TAKE 1 CAPSULE BY MOUTH THREE TIMES A DAY    glipiZIDE (GLUCOTROL) 5 MG tablet Take 1 tablet (5 mg total) by mouth 2 (two) times daily before a meal. NO MORE REFILLS WITHOUT AN OFFICE VISIT    glucose blood test strip 1 each by Other route 3 (three) times daily.    hydrochlorothiazide (HYDRODIURIL) 25 MG tablet Take 1 tablet (25 mg total) by mouth daily.    KLOR-CON M20 20 MEQ tablet Take 1 tablet (20 mEq total) by mouth 2 (two) times daily.    metoprolol succinate (TOPROL-XL) 25 MG 24 hr tablet Take 1 tablet (25 mg total) by mouth daily.    Multiple Vitamin (MULTI-VITAMIN) tablet Take by mouth.    ONETOUCH DELICA LANCETS 16W MISC  06/05/2014: Received from: [facility]   rosuvastatin (CRESTOR) 5 MG tablet Take 1 tablet (5 mg total) by mouth daily.    Semaglutide,0.25 or 0.5MG /DOS, (OZEMPIC, 0.25 OR 0.5 MG/DOSE,) 2 MG/1.5ML SOPN Inject 0.5 mg into the skin once a week.    No facility-administered encounter medications on file as of 11/04/2020.    Reviewed chart for medication changes and adherence.  No OVs, Consults, or hospital visits since last care coordination call / Pharmacist visit. No medication changes indicated  No gaps in adherence identified. Patient  has follow up scheduled with pharmacy team. No further action required.   Lizbeth Bark Clinical Pharmacist Assistant [phone]

## 2020-11-09 ENCOUNTER — Other Ambulatory Visit: Payer: Self-pay | Admitting: Nurse Practitioner

## 2020-11-16 ENCOUNTER — Other Ambulatory Visit: Payer: Self-pay

## 2020-11-16 ENCOUNTER — Telehealth: Payer: Self-pay | Admitting: Pharmacist

## 2020-11-16 ENCOUNTER — Ambulatory Visit
Admission: RE | Admit: 2020-11-16 | Discharge: 2020-11-16 | Disposition: A | Discharge status: Home / Self Care | Payer: Medicare (Managed Care) | Source: Ambulatory Visit | Attending: Nurse Practitioner | Admitting: Nurse Practitioner

## 2020-11-16 DIAGNOSIS — Z1231 Encounter for screening mammogram for malignant neoplasm of breast: Secondary | ICD-10-CM | POA: Insufficient documentation

## 2020-11-16 NOTE — Progress Notes (Signed)
[facility])  [place] Team    11/18/2020  Belle Charlie September 05, 1960 [id number]  Reason for referral: Medication Assistance  Referral source:  Care Guide  sent to [place] due to Birdena Crandall, Rph leaving Current insurance:  [employer]   Outreach:  Successful telephone call with Ms. Busick (patient).   HIPAA identifiers verified.   Objective: The 10-year ASCVD risk score Mikey Bussing DC Jr., et al., 2013) is: 8.9%   Values used to calculate the score:     Age: 60 years     Sex: Female     Is Non-Hispanic African American: Yes     Diabetic: Yes     Tobacco smoker: No     Systolic Blood Pressure: 240 mmHg     Is BP treated: Yes     HDL Cholesterol: 49 mg/dL     Total Cholesterol: 161 mg/dL  Lab Results  Component Value Date   CREATININE 0.94 09/30/2020   CREATININE 0.91 07/03/2020   CREATININE 0.86 01/01/2020    Lab Results  Component Value Date   HGBA1C 6.6 (H) 09/30/2020    Lipid Panel     Component Value Date/Time   CHOL 161 09/30/2020 0904   TRIG 87 09/30/2020 0904   HDL 49 09/30/2020 0904   CHOLHDL 3.3 09/30/2020 0904   CHOLHDL 4 04/28/2015 1346   VLDL 19.8 04/28/2015 1346   LDLCALC 96 09/30/2020 0904    BP Readings from Last 3 Encounters:  09/30/20 117/72  07/03/20 136/85  05/20/20 (!) 148/85    Allergies  Allergen Reactions   Doxycycline Diarrhea and Nausea And Vomiting   Nsaids Other (See Comments)    Bleeding ulcer   Other Other (See Comments)   Sulfa Antibiotics    Tolmetin     Other reaction(s): Other (See Comments) Bleeding ulcer   Ace Inhibitors Other (See Comments) and Rash    Significant kidney insufficiency-  each time lisinopril restarted Significant AKI each time lisinopril restarted    Aspirin Rash    Bleeding ulcers    Medication Assistance Findings:  Needs assistance with Ozempic, patient has entered the Medicare Coverage Gap.   Extra Help:  Not eligible for Extra Help Low Income  Subsidy based on reported income and assets  Patient Assistance Programs: Ozempic  made by [employer] requirement met: Yes Out-of-pocket prescription expenditure met:   Not Applicable Reviewed program requirements with patient.     Plan: I will route patient assistance letter to [place] technician who will coordinate patient assistance program application process for medications listed above.  [facility] pharmacy technician will assist with obtaining all required documents from both patient and provider(s) and submit application(s) once completed.

## 2020-11-19 NOTE — Progress Notes (Signed)
Please let patient know her Mammogram did not show any evidence of a malignancy.  The recommendation is to repeat the Mammogram in 1 year.  

## 2020-12-12 ENCOUNTER — Other Ambulatory Visit: Payer: Self-pay | Admitting: Nurse Practitioner

## 2020-12-12 NOTE — Telephone Encounter (Signed)
Requested Prescriptions  Pending Prescriptions Disp Refills  . gabapentin (NEURONTIN) 300 MG capsule [Pharmacy Med Name: GABAPENTIN 300 MG CAPSULE] 90 capsule 0    Sig: TAKE 1 CAPSULE BY MOUTH THREE TIMES A DAY     Neurology: Anticonvulsants - gabapentin Passed - 12/12/2020 11:30 AM      Passed - Valid encounter within last 12 months    Recent Outpatient Visits          2 months ago Hypertension associated with diabetes (HCC)   [facility] Larae Grooms, NP   5 months ago Type 2 diabetes mellitus without complication, without long-term current use of insulin (HCC)   [facility] McElwee, Lauren A, NP   6 months ago [place] immune status not known   [employer], Darl Householder, DO   11 months ago Encounter for screening for HIV   [employer], Salley Hews, [place]   1 year ago Encounter for screening mammogram for malignant neoplasm of breast   [facility] Roosvelt Maser [place], [place]

## 2020-12-25 ENCOUNTER — Other Ambulatory Visit: Payer: Self-pay | Admitting: Nurse Practitioner

## 2021-01-06 ENCOUNTER — Telehealth: Payer: Self-pay | Admitting: Nurse Practitioner

## 2021-01-06 NOTE — Telephone Encounter (Signed)
-----   Message from Montel Culver, CPhT sent at 01/06/2021  4:00 PM EDT ----- Regarding: RE: Medication assistance for Ozempic Thank you so much!  Will you guys call her or do  you want me to? ----- Message ----- From: Larae Grooms, NP Sent: 01/06/2021   3:58 PM EDT To: Montel Culver, CPhT Subject: RE: Medication assistance for Ozempic          We do have samples that we can give her to hold her over.  Hannah Calhoun ----- Message ----- From: Montel Culver, CPhT Sent: 01/06/2021   3:20 PM EDT To: Hannah Calhoun, RPH, # Subject: Medication assistance for Hannah Calhoun,   I am helping Hannah Calhoun get medication assistance through [employer] for [employer]. She has been approved through the end of the year, however Ozempic is on backorder until mid to late September.   I just spoke to her and she informed me she is out of Ozempic.  I wasn't sure if you had samples you could give her to hold her over for a month and hope it comes back in stock.  Just FYI 0.25-0.5 pen is on backorder until mid to late September and all others doses are on backorder until mid to late October.      Thank you!  Hannah Calhoun  Certified Pharmacy Technician [facility]  [address] 2nd floor  [place], [place] [address]   Office: [phone] Mobile: [phone] Fax: [phone] Email: [email or web address]

## 2021-01-06 NOTE — Telephone Encounter (Signed)
Please reach out to patient and let her know that we have samples of Ozempic and she can come in and pick up one.

## 2021-01-07 NOTE — Telephone Encounter (Signed)
Left detailed message for patient regarding samples of Ozempic here in the office for pick up and informed patient of office hours. Advised patient to give our office a call back if she has any questions.

## 2021-01-08 ENCOUNTER — Telehealth: Payer: Self-pay

## 2021-01-08 NOTE — Telephone Encounter (Signed)
Patient picked up 1 sample of Ozempic today per Karen Holdsworth, NP. 

## 2021-01-08 NOTE — Telephone Encounter (Signed)
Patient picked up 1 sample of Ozempic today per Larae Grooms, NP.

## 2021-01-18 NOTE — Progress Notes (Signed)
BP 128/80 (BP Location: Left Arm, Cuff Size: Normal)   Pulse 69   Temp 98.8 F (37.1 C) (Oral)   Wt 214 lb 12.8 oz (97.4 kg)   LMP  (LMP Unknown)   SpO2 96%   BMI 35.20 kg/m    Subjective:    Patient ID: Hannah Calhoun, female    DOB: 24-Mar-1961, 60 y.o.   MRN: [id number]  HPI: Hannah Calhoun is a 60 y.o. female  Chief Complaint  Patient presents with   Knee Pain    Pt states she has been having bilateral knee pain for the last few months. States it has becoming more constant lately. No known injuries per patient.    Hemorrhoids    Pt states she has been having trouble with hemorrhoids for a while, states she get occasional ones that pop   KNEE PAIN Duration: months Involved knee: bilateral Mechanism of injury: unknown Location:anterior Onset: gradual Severity: moderate  Quality:  sharp and shooting Frequency: constant Radiation: no Aggravating factors: walking, stairs, and bending  Alleviating factors: nothing  Status: worse Treatments attempted:  tylenol   Relief with NSAIDs?:  No NSAIDs Taken Weakness with weight bearing or walking: yes Sensation of giving way: yes Locking: no Popping: yes Bruising: no Swelling: no Redness: no Paresthesias/decreased sensation: no Fevers: no    Relevant past medical, surgical, family and social history reviewed and updated as indicated. Interim medical history since our last visit reviewed. Allergies and medications reviewed and updated.  Review of Systems  Musculoskeletal:        Bilateral knee pain   Per HPI unless specifically indicated above     Objective:    BP 128/80 (BP Location: Left Arm, Cuff Size: Normal)   Pulse 69   Temp 98.8 F (37.1 C) (Oral)   Wt 214 lb 12.8 oz (97.4 kg)   LMP  (LMP Unknown)   SpO2 96%   BMI 35.20 kg/m   Wt Readings from Last 3 Encounters:  01/19/21 214 lb 12.8 oz (97.4 kg)  09/30/20 214 lb (97.1 kg)  07/03/20 214 lb 6.4 oz (97.3 kg)    Physical  Exam Vitals and nursing note reviewed.  Constitutional:      General: She is not in acute distress.    Appearance: Normal appearance. She is normal weight. She is not ill-appearing, toxic-appearing or diaphoretic.  HENT:     Head: Normocephalic.     Right Ear: External ear normal.     Left Ear: External ear normal.     Nose: Nose normal.     Mouth/Throat:     Mouth: Mucous membranes are moist.     Pharynx: Oropharynx is clear.  Eyes:     General:        Right eye: No discharge.        Left eye: No discharge.     Extraocular Movements: Extraocular movements intact.     Conjunctiva/sclera: Conjunctivae normal.     Pupils: Pupils are equal, round, and reactive to light.  Cardiovascular:     Rate and Rhythm: Normal rate and regular rhythm.     Heart sounds: No murmur heard. Pulmonary:     Effort: Pulmonary effort is normal. No respiratory distress.     Breath sounds: Normal breath sounds. No wheezing or rales.  Musculoskeletal:        General: Tenderness (tenderness over joint line on palpation) present. No swelling, deformity or signs of injury. Normal range of motion.  Cervical back: Normal range of motion and neck supple.     Right lower leg: No edema.     Left lower leg: No edema.  Skin:    General: Skin is warm and dry.     Capillary Refill: Capillary refill takes less than 2 seconds.  Neurological:     General: No focal deficit present.     Mental Status: She is alert and oriented to person, place, and time. Mental status is at baseline.  Psychiatric:        Mood and Affect: Mood normal.        Behavior: Behavior normal.        Thought Content: Thought content normal.        Judgment: Judgment normal.    Results for orders placed or performed in visit on 09/30/20  HM DIABETES EYE EXAM  Result Value Ref Range   HM Diabetic Eye Exam No Retinopathy No Retinopathy      Assessment & Plan:   Problem List Items Addressed This Visit   None Visit Diagnoses      Chronic pain of both knees    -  Primary   Will obtain xrays of bilateral knees. Discussed possible arthritis. Discussed possible referral to Orthopedics. Will make recommendations based on imaging.   Relevant Orders   DG Knee Complete 4 Views Right   DG Knee Complete 4 Views Left   Need for influenza vaccination       Relevant Orders   Flu Vaccine QUAD 55mo+IM (Fluarix, Fluzone & Alfiuria Quad PF) (Completed)        Follow up plan: Return if symptoms worsen or fail to improve.

## 2021-01-19 ENCOUNTER — Encounter: Payer: Self-pay | Admitting: Nurse Practitioner

## 2021-01-19 ENCOUNTER — Ambulatory Visit: Payer: Medicare (Managed Care) | Admitting: Nurse Practitioner

## 2021-01-19 ENCOUNTER — Ambulatory Visit
Admission: RE | Admit: 2021-01-19 | Discharge: 2021-01-19 | Disposition: A | Discharge status: Home / Self Care | Payer: Medicare (Managed Care) | Source: Ambulatory Visit | Attending: Nurse Practitioner | Admitting: Nurse Practitioner

## 2021-01-19 ENCOUNTER — Other Ambulatory Visit: Payer: Self-pay

## 2021-01-19 VITALS — BP 128/80 | HR 69 | Temp 98.8°F | Wt 214.8 lb

## 2021-01-19 DIAGNOSIS — G8929 Other chronic pain: Secondary | ICD-10-CM

## 2021-01-19 DIAGNOSIS — M25562 Pain in left knee: Secondary | ICD-10-CM | POA: Diagnosis present

## 2021-01-19 DIAGNOSIS — M25561 Pain in right knee: Secondary | ICD-10-CM

## 2021-01-19 DIAGNOSIS — Z23 Encounter for immunization: Secondary | ICD-10-CM | POA: Diagnosis not present

## 2021-01-20 NOTE — Progress Notes (Signed)
HI Hannah Calhoun.  Your xrays were normal.  No evidence of arthritis. I have placed a referral for you to see Orthopedics. Please let me know if you have further questions.

## 2021-01-20 NOTE — Addendum Note (Signed)
Addended by: Larae Grooms on: 01/20/2021 03:14 PM   Modules accepted: Orders

## 2021-01-20 NOTE — Progress Notes (Signed)
HI Ms. Albie.  Your xrays were normal.  No evidence of arthritis. I have placed a referral for you to see Orthopedics. Please let me know if you have further questions.

## 2021-01-22 ENCOUNTER — Ambulatory Visit: Payer: Medicare (Managed Care) | Admitting: Nurse Practitioner

## 2021-02-01 DIAGNOSIS — M170 Bilateral primary osteoarthritis of knee: Secondary | ICD-10-CM | POA: Insufficient documentation

## 2021-02-02 ENCOUNTER — Telehealth: Payer: Self-pay

## 2021-02-02 NOTE — Telephone Encounter (Signed)
5 boxes of Ozempic received for the patient. Called and let her know that it was ready to be picked up.

## 2021-02-04 ENCOUNTER — Other Ambulatory Visit: Payer: Self-pay | Admitting: Nurse Practitioner

## 2021-02-07 ENCOUNTER — Other Ambulatory Visit: Payer: Self-pay | Admitting: Nurse Practitioner

## 2021-02-18 ENCOUNTER — Telehealth: Payer: Self-pay

## 2021-02-18 NOTE — Chronic Care Management (AMB) (Signed)
    Chronic Care Management Pharmacy Assistant   Name: Maudine Kluesner  MRN: [id number] DOB: April 15, 1961  Reason for Encounter:Patient Assistance Application for Ozempic     Medications: Outpatient Encounter Medications as of 02/18/2021  Medication Sig Note   acetaminophen (TYLENOL) 500 MG tablet Take 650 mg by mouth 2 (two) times daily.    amLODipine (NORVASC) 10 MG tablet Take 1 tablet (10 mg total) by mouth daily.    Blood Glucose Monitoring Suppl (ONE TOUCH ULTRA MINI) W/DEVICE KIT  06/05/2014: Received from: [place]   Cholecalciferol 25 MCG (1000 UT) capsule Take by mouth daily.     fluticasone (FLONASE) 50 MCG/ACT nasal spray PLACE 1 SPRAY INTO BOTH NOSTRILS DAILY.    gabapentin (NEURONTIN) 300 MG capsule TAKE 1 CAPSULE BY MOUTH THREE TIMES A DAY    glipiZIDE (GLUCOTROL) 5 MG tablet TAKE 1 TABLET BY MOUTH TWICE A DAY BEFORE A MEAL-NO MORE REFILLS WITHOUT OFFICE VISIT    glucose blood test strip 1 each by Other route 3 (three) times daily.    hydrochlorothiazide (HYDRODIURIL) 25 MG tablet Take 1 tablet (25 mg total) by mouth daily.    KLOR-CON M20 20 MEQ tablet TAKE 1 TABLET BY MOUTH TWICE A DAY    metoprolol succinate (TOPROL-XL) 25 MG 24 hr tablet Take 1 tablet (25 mg total) by mouth daily.    Multiple Vitamin (MULTI-VITAMIN) tablet Take by mouth.    ONETOUCH DELICA LANCETS 80H MISC  06/05/2014: Received from: [place]   rosuvastatin (CRESTOR) 5 MG tablet TAKE 1 TABLET (5 MG TOTAL) BY MOUTH DAILY.    Semaglutide,0.25 or 0.5MG/DOS, (OZEMPIC, 0.25 OR 0.5 MG/DOSE,) 2 MG/1.5ML SOPN Inject 0.5 mg into the skin once a week.    No facility-administered encounter medications on file as of 02/18/2021.   Prefilled and mailed patient assistance application for the patient. Patient is aware of the form and will complete the form and return to PCP's office.  Corrie Mckusick, [place]

## 2021-03-02 ENCOUNTER — Telehealth: Payer: Self-pay | Admitting: Nurse Practitioner

## 2021-03-03 NOTE — Telephone Encounter (Signed)
Accidental encounter. 

## 2021-04-08 ENCOUNTER — Ambulatory Visit: Payer: Medicare (Managed Care)

## 2021-05-06 ENCOUNTER — Other Ambulatory Visit: Payer: Self-pay | Admitting: Nurse Practitioner

## 2021-05-06 NOTE — Telephone Encounter (Signed)
Requested Prescriptions  Pending Prescriptions Disp Refills   KLOR-CON M20 20 MEQ tablet [Pharmacy Med Name: KLOR-CON M20 TABLET] 180 tablet 0    Sig: TAKE 1 TABLET BY MOUTH TWICE A DAY     Endocrinology:  Minerals - Potassium Supplementation Passed - 05/06/2021  7:13 AM      Passed - K in normal range and within 360 days    Potassium  Date Value Ref Range Status  09/30/2020 3.7 3.5 - 5.2 mmol/L Final  01/22/2013 3.1 (L) 3.5 - 5.1 mmol/L Final         Passed - Cr in normal range and within 360 days    Creatinine  Date Value Ref Range Status  01/22/2013 0.86 0.60 - 1.30 mg/dL Final   Creatinine, Ser  Date Value Ref Range Status  09/30/2020 0.94 0.57 - 1.00 mg/dL Final   Creatinine,U  Date Value Ref Range Status  09/10/2014 65.8 mg/dL Final         Passed - Valid encounter within last 12 months    Recent Outpatient Visits          3 months ago Chronic pain of both knees   [facility] [place], Clydie Braun, NP   7 months ago Hypertension associated with diabetes (HCC)   [facility] Larae Grooms, NP   10 months ago Type 2 diabetes mellitus without complication, without long-term current use of insulin (HCC)   [facility] McElwee, Lauren A, NP   11 months ago [place] immune status not known   [employer], Darl Householder, DO   1 year ago Encounter for screening for HIV   [facility] [place], Salley Hews, PA-C              glipiZIDE (GLUCOTROL) 5 MG tablet [Pharmacy Med Name: GLIPIZIDE 5 MG TABLET] 180 tablet 0    Sig: TAKE 1 TABLET BY MOUTH TWICE A DAY BEFORE A MEAL-NO MORE REFILLS WITHOUT OFFICE VISIT     Endocrinology:  Diabetes - Sulfonylureas Failed - 05/06/2021  7:13 AM      Failed - HBA1C is between 0 and 7.9 and within 180 days    HB A1C (BAYER DCA - WAIVED)  Date Value Ref Range Status  07/03/2020 7.5 (H) <7.0 % Final    Comment:                                          Diabetic Adult             <7.0                                       Healthy Adult        4.3 - 5.7                                                           (DCCT/NGSP) American Diabetes Association's Summary of Glycemic Recommendations for Adults with Diabetes: Hemoglobin A1c <7.0%. More stringent glycemic goals (A1c <6.0%) may further reduce complications at the cost of increased risk of hypoglycemia.    Hgb A1c MFr  Bld  Date Value Ref Range Status  09/30/2020 6.6 (H) 4.8 - 5.6 % Final    Comment:             Prediabetes: 5.7 - 6.4          Diabetes: >6.4          Glycemic control for adults with diabetes: <7.0          Passed - Valid encounter within last 6 months    Recent Outpatient Visits          3 months ago Chronic pain of both knees   [facility] [place], Clydie Braun, NP   7 months ago Hypertension associated with diabetes [facility])   [facility] Larae Grooms, NP   10 months ago Type 2 diabetes mellitus without complication, without long-term current use of insulin (HCC)   [facility] McElwee, Lauren A, NP   11 months ago [place] immune status not known   [employer] [place], Darl Householder, DO   1 year ago Encounter for screening for HIV   [facility] Particia Nearing, [place]

## 2021-05-06 NOTE — Telephone Encounter (Signed)
Requested Prescriptions  Pending Prescriptions Disp Refills   hydrochlorothiazide (HYDRODIURIL) 25 MG tablet [Pharmacy Med Name: HYDROCHLOROTHIAZIDE 25 MG TAB] 90 tablet 0    Sig: TAKE 1 TABLET (25 MG TOTAL) BY MOUTH DAILY.     Cardiovascular: Diuretics - Thiazide Failed - 05/06/2021  7:13 AM      Failed - Na in normal range and within 360 days    Sodium  Date Value Ref Range Status  09/30/2020 146 (H) 134 - 144 mmol/L Final  01/22/2013 134 (L) 136 - 145 mmol/L Final         Passed - Ca in normal range and within 360 days    Calcium  Date Value Ref Range Status  09/30/2020 9.0 8.7 - 10.2 mg/dL Final   Calcium, Total  Date Value Ref Range Status  01/22/2013 8.2 (L) 8.5 - 10.1 mg/dL Final         Passed - Cr in normal range and within 360 days    Creatinine  Date Value Ref Range Status  01/22/2013 0.86 0.60 - 1.30 mg/dL Final   Creatinine, Ser  Date Value Ref Range Status  09/30/2020 0.94 0.57 - 1.00 mg/dL Final   Creatinine,U  Date Value Ref Range Status  09/10/2014 65.8 mg/dL Final         Passed - K in normal range and within 360 days    Potassium  Date Value Ref Range Status  09/30/2020 3.7 3.5 - 5.2 mmol/L Final  01/22/2013 3.1 (L) 3.5 - 5.1 mmol/L Final         Passed - Last BP in normal range    BP Readings from Last 1 Encounters:  01/19/21 128/80         Passed - Valid encounter within last 6 months    Recent Outpatient Visits          3 months ago Chronic pain of both knees   [facility] [place], Clydie Braun, NP   7 months ago Hypertension associated with diabetes (HCC)   [facility] Larae Grooms, NP   10 months ago Type 2 diabetes mellitus without complication, without long-term current use of insulin (HCC)   [facility] McElwee, Lauren A, NP   11 months ago [place] immune status not known   [employer], Darl Householder, DO   1 year ago Encounter for screening for HIV   [facility] Particia Nearing, PA-C              amLODipine (NORVASC) 10 MG tablet [Pharmacy Med Name: AMLODIPINE BESYLATE 10 MG TAB] 90 tablet 0    Sig: TAKE 1 TABLET BY MOUTH EVERY DAY     Cardiovascular:  Calcium Channel Blockers Passed - 05/06/2021  7:13 AM      Passed - Last BP in normal range    BP Readings from Last 1 Encounters:  01/19/21 128/80         Passed - Valid encounter within last 6 months    Recent Outpatient Visits          3 months ago Chronic pain of both knees   [facility] [place], Clydie Braun, NP   7 months ago Hypertension associated with diabetes [facility])   [facility] Larae Grooms, NP   10 months ago Type 2 diabetes mellitus without complication, without long-term current use of insulin (HCC)   [facility] McElwee, Lauren A, NP   11 months ago [place] immune status not  known   [facility] [place], Darl Householder, DO   1 year ago Encounter for screening for HIV   [facility] Particia Nearing, [place]              metoprolol succinate (TOPROL-XL) 25 MG 24 hr tablet [Pharmacy Med Name: METOPROLOL SUCC ER 25 MG TAB] 90 tablet 0    Sig: TAKE 1 TABLET (25 MG TOTAL) BY MOUTH DAILY.     Cardiovascular:  Beta Blockers Passed - 05/06/2021  7:13 AM      Passed - Last BP in normal range    BP Readings from Last 1 Encounters:  01/19/21 128/80         Passed - Last Heart Rate in normal range    Pulse Readings from Last 1 Encounters:  01/19/21 69         Passed - Valid encounter within last 6 months    Recent Outpatient Visits          3 months ago Chronic pain of both knees   [facility] [place], Clydie Braun, NP   7 months ago Hypertension associated with diabetes [facility])   [facility] Larae Grooms, NP   10 months ago Type 2 diabetes mellitus without complication, without long-term current use of insulin (HCC)   [facility] McElwee, Lauren A,  NP   11 months ago [place] immune status not known   [employer] [place], Darl Householder, DO   1 year ago Encounter for screening for HIV   [facility] Particia Nearing, [place]

## 2021-05-27 DIAGNOSIS — E559 Vitamin D deficiency, unspecified: Secondary | ICD-10-CM | POA: Diagnosis not present

## 2021-05-27 DIAGNOSIS — M1990 Unspecified osteoarthritis, unspecified site: Secondary | ICD-10-CM | POA: Diagnosis not present

## 2021-05-27 DIAGNOSIS — I10 Essential (primary) hypertension: Secondary | ICD-10-CM | POA: Diagnosis not present

## 2021-05-27 DIAGNOSIS — Z87891 Personal history of nicotine dependence: Secondary | ICD-10-CM | POA: Diagnosis not present

## 2021-05-27 DIAGNOSIS — E669 Obesity, unspecified: Secondary | ICD-10-CM | POA: Diagnosis not present

## 2021-05-27 DIAGNOSIS — E876 Hypokalemia: Secondary | ICD-10-CM | POA: Diagnosis not present

## 2021-05-27 DIAGNOSIS — E1169 Type 2 diabetes mellitus with other specified complication: Secondary | ICD-10-CM | POA: Diagnosis not present

## 2021-05-27 DIAGNOSIS — E785 Hyperlipidemia, unspecified: Secondary | ICD-10-CM | POA: Diagnosis not present

## 2021-05-27 DIAGNOSIS — E1142 Type 2 diabetes mellitus with diabetic polyneuropathy: Secondary | ICD-10-CM | POA: Diagnosis not present

## 2021-05-27 DIAGNOSIS — M8580 Other specified disorders of bone density and structure, unspecified site: Secondary | ICD-10-CM | POA: Diagnosis not present

## 2021-06-03 DIAGNOSIS — E113393 Type 2 diabetes mellitus with moderate nonproliferative diabetic retinopathy without macular edema, bilateral: Secondary | ICD-10-CM | POA: Diagnosis not present

## 2021-06-14 ENCOUNTER — Telehealth: Payer: Self-pay

## 2021-06-14 NOTE — Telephone Encounter (Signed)
1 box of Ozempic received for the patient. Called and notified her that it was ready for pick up.

## 2021-06-15 ENCOUNTER — Ambulatory Visit (INDEPENDENT_AMBULATORY_CARE_PROVIDER_SITE_OTHER): Payer: HMO | Admitting: *Deleted

## 2021-06-15 DIAGNOSIS — Z0000 Encounter for general adult medical examination without abnormal findings: Secondary | ICD-10-CM | POA: Diagnosis not present

## 2021-06-15 NOTE — Progress Notes (Signed)
Subjective:   Hannah Calhoun is a 61 y.o. female who presents for Medicare Annual (Subsequent) preventive examination.  I connected with  Mckay Brandt on 06/15/21 by a telephone enabled telemedicine application and verified that I am speaking with the correct person using two identifiers.   I discussed the limitations of evaluation and management by telemedicine. The patient expressed understanding and agreed to proceed.  Patient location: home  Provider location:  Tele-Health not in office  Review of Systems     Cardiac Risk Factors include: advanced age (>43mn, >>52women);diabetes mellitus;hypertension;obesity (BMI >30kg/m2)     Objective:    Today's Vitals   06/15/21 1205  PainSc: 2    There is no height or weight on file to calculate BMI.  Advanced Directives 06/15/2021 04/03/2019 08/31/2015 06/29/2015 05/01/2015  Does Patient Have a Medical Advance Directive? No No No No No  Would patient like information on creating a medical advance directive? No - Patient declined - - No - patient declined information No - patient declined information    Current Medications (verified) Outpatient Encounter Medications as of 06/15/2021  Medication Sig   acetaminophen (TYLENOL) 500 MG tablet Take 650 mg by mouth 2 (two) times daily.   amLODipine (NORVASC) 10 MG tablet TAKE 1 TABLET BY MOUTH EVERY DAY   Blood Glucose Monitoring Suppl (ONE TOUCH ULTRA MINI) W/DEVICE KIT    Cholecalciferol 25 MCG (1000 UT) capsule Take by mouth daily.    fluticasone (FLONASE) 50 MCG/ACT nasal spray PLACE 1 SPRAY INTO BOTH NOSTRILS DAILY.   gabapentin (NEURONTIN) 300 MG capsule TAKE 1 CAPSULE BY MOUTH THREE TIMES A DAY   glipiZIDE (GLUCOTROL) 5 MG tablet TAKE 1 TABLET BY MOUTH TWICE A DAY BEFORE A MEAL-NO MORE REFILLS WITHOUT OFFICE VISIT   glucose blood test strip 1 each by Other route 3 (three) times daily.   hydrochlorothiazide (HYDRODIURIL) 25 MG tablet TAKE 1 TABLET (25 MG TOTAL) BY MOUTH  DAILY.   KLOR-CON M20 20 MEQ tablet TAKE 1 TABLET BY MOUTH TWICE A DAY   metoprolol succinate (TOPROL-XL) 25 MG 24 hr tablet TAKE 1 TABLET (25 MG TOTAL) BY MOUTH DAILY.   Multiple Vitamin (MULTI-VITAMIN) tablet Take by mouth.   ONETOUCH DELICA LANCETS 332DMISC    rosuvastatin (CRESTOR) 5 MG tablet TAKE 1 TABLET (5 MG TOTAL) BY MOUTH DAILY.   Semaglutide,0.25 or 0.5MG/DOS, (OZEMPIC, 0.25 OR 0.5 MG/DOSE,) 2 MG/1.5ML SOPN Inject 0.5 mg into the skin once a week.   No facility-administered encounter medications on file as of 06/15/2021.    Allergies (verified) Doxycycline, Nsaids, Other, Sulfa antibiotics, Tolmetin, Ace inhibitors, and Aspirin   History: Past Medical History:  Diagnosis Date   Allergy    Arthritis    Chicken pox    Depression    Diabetes mellitus without complication (HCC)    Diverticulitis    GERD (gastroesophageal reflux disease)    History of colon polyps    Hyperlipidemia    Hypertension    Past Surgical History:  Procedure Laterality Date   BREAST BIOPSY Right 1990   EXCISIONAL - NEG   BREAST SURGERY Right 1990   CHOLECYSTECTOMY     KNEE ARTHROSCOPY  2010   NASAL SEPTUM SURGERY  2010   TONSILLECTOMY     TOTAL ABDOMINAL HYSTERECTOMY  2009   TOTAL HIP ARTHROPLASTY     Family History  Problem Relation Age of Onset   Alcohol abuse Mother    Hyperlipidemia Mother    Heart disease  Mother    Hypertension Mother    Heart attack Mother    Alcohol abuse Father    Hyperlipidemia Father    Heart disease Father    Hypertension Father    Heart attack Father    Alcohol abuse Maternal Aunt    Hypertension Maternal Aunt    Kidney disease Maternal Aunt    Alcohol abuse Maternal Uncle    Hypertension Maternal Uncle    Alcohol abuse Paternal Aunt    Birth defects Paternal Aunt        Colon, Breast, Lung   Heart disease Paternal Aunt    Breast cancer Paternal Aunt    Alcohol abuse Paternal Uncle    Heart disease Paternal Uncle    Stroke Maternal Grandmother     Hypertension Maternal Grandmother    Alcohol abuse Maternal Grandfather    Heart disease Maternal Grandfather    Hypertension Maternal Grandfather    Social History   Socioeconomic History   Marital status: Married    Spouse name: Not on file   Number of children: Not on file   Years of education: Not on file   Highest education level: Not on file  Occupational History   Not on file  Tobacco Use   Smoking status: Former    Packs/day: 0.50    Years: 15.00    Pack years: 7.50    Types: Cigarettes    Quit date: 06/12/2005    Years since quitting: 16.0   Smokeless tobacco: Never   Tobacco comments:    quit 2007  Vaping Use   Vaping Use: Never used  Substance and Sexual Activity   Alcohol use: Not Currently    Alcohol/week: 0.0 standard drinks   Drug use: No   Sexual activity: Not Currently  Other Topics Concern   Not on file  Social History Narrative   Not on file   Social Determinants of Health   Financial Resource Strain: Low Risk    Difficulty of Paying Living Expenses: Not very hard  Food Insecurity: No Food Insecurity   Worried About Charity fundraiser in the Last Year: Never true   Ran Out of Food in the Last Year: Never true  Transportation Needs: No Transportation Needs   Lack of Transportation (Medical): No   Lack of Transportation (Non-Medical): No  Physical Activity: Sufficiently Active   Days of Exercise per Week: 5 days   Minutes of Exercise per Session: 30 min  Stress: No Stress Concern Present   Feeling of Stress : Not at all  Social Connections: Moderately Integrated   Frequency of Communication with Friends and Family: More than three times a week   Frequency of Social Gatherings with Friends and Family: Twice a week   Attends Religious Services: Never   Marine scientist or Organizations: Yes   Attends Music therapist: 1 to 4 times per year   Marital Status: Married    Tobacco Counseling Counseling given: Not  Answered Tobacco comments: quit 2007   Clinical Intake:  Pre-visit preparation completed: Yes  Pain : 0-10 Pain Score: 2  Pain Location: Knee Pain Descriptors / Indicators: Constant, Aching, Dull Pain Onset: More than a month ago Pain Frequency: Constant     Nutritional Risks: None Diabetes: Yes CBG done?: No Did pt. bring in CBG monitor from home?: No  How often do you need to have someone help you when you read instructions, pamphlets, or other written materials from your doctor or pharmacy?:  1 - Never  Diabetic?  Yes  Nutrition Risk Assessment:  Has the patient had any N/V/D within the last 2 months?  No  Does the patient have any non-healing wounds?  No  Has the patient had any unintentional weight loss or weight gain?  No   Diabetes:  Is the patient diabetic?  Yes  If diabetic, was a CBG obtained today?  No  Did the patient bring in their glucometer from home?  No  How often do you monitor your CBG's? 2 x a week.   Financial Strains and Diabetes Management:  Are you having any financial strains with the device, your supplies or your medication? No .  Does the patient want to be seen by Chronic Care Management for management of their diabetes?  No  Would the patient like to be referred to a Nutritionist or for Diabetic Management?  No   Diabetic Exams:  Diabetic Eye Exam: Completed . Overdue for diabetic eye exam.   Diabetic Foot Exam: Pt has been advised about the importance in completing this exam.   Interpreter Needed?: No  Information entered by :: Leroy Kennedy LPN   Activities of Daily Living In your present state of health, do you have any difficulty performing the following activities: 06/15/2021 07/03/2020  Hearing? N N  Vision? N N  Difficulty concentrating or making decisions? N N  Walking or climbing stairs? N Y  Dressing or bathing? N N  Doing errands, shopping? N N  Preparing Food and eating ? N -  Using the Toilet? N -  In the past six  months, have you accidently leaked urine? N -  Do you have problems with loss of bowel control? N -  Managing your Medications? N -  Managing your Finances? N -  Housekeeping or managing your Housekeeping? N -  Some recent data might be hidden    Patient Care Team: Jon Billings, NP as PCP - General [place]on Kingfisher Junita Push, [facility] (Inactive) (Pharmacist)  Indicate any recent Medical Services you may have received from other than [facility] providers in the past year (date may be approximate).     Assessment:   This is a routine wellness examination for Naeemah.  Hearing/Vision screen Hearing Screening - Comments:: No trouble hearing Vision Screening - Comments:: Dr. Gloriann Loan  Up to dat  Dietary issues and exercise activities discussed: Current Exercise Habits: Home exercise routine, Type of exercise: walking, Time (Minutes): 40, Frequency (Times/Week): 5, Weekly Exercise (Minutes/Week): 200, Intensity: Moderate   Goals Addressed             This Visit's Progress    Patient Stated       Maintain A!C under 6.5       Depression Screen PHQ 2/9 Scores 06/15/2021 07/03/2020 09/05/2019 04/28/2015 01/20/2015 09/10/2014 09/10/2014  PHQ - 2 Score 0 0 2 0 0 0 0  PHQ- 9 Score - - 9 - - - -    Fall Risk Fall Risk  06/15/2021 07/03/2020 09/05/2019 04/28/2015 01/20/2015  Falls in the past year? 0 1 1 No No  Number falls in past yr: 0 0 1 - -  Injury with Fall? 0 0 1 - -  Comment - - knee in 2017 - -  Risk for fall due to : - History of fall(s) - - -  Follow up Falls evaluation completed;Education provided;Falls prevention discussed Falls evaluation completed - - -    FALL RISK PREVENTION PERTAINING TO THE HOME:  Any stairs in or  around the home? Yes  If so, are there any without handrails? No  Home free of loose throw rugs in walkways, pet beds, electrical cords, etc? Yes  Adequate lighting in your home to reduce risk of falls? Yes   ASSISTIVE DEVICES UTILIZED TO PREVENT FALLS:  Life alert? No  Use  of a cane, walker or w/c? No  Grab bars in the bathroom? No  Shower chair or bench in shower? No  Elevated toilet seat or a handicapped toilet? Yes   TIMED UP AND GO:  Was the test performed? No .    Cognitive Function:   Normal cognitive status assessed by direct observation by this Nurse Health Advisor. No abnormalities found.        Immunizations Immunization History  Administered Date(s) Administered   Influenza Split 02/28/2014   Influenza,inj,Quad PF,6+ Mos 01/20/2015, 01/13/2016, 02/23/2017, 03/02/2018, 01/21/2019, 01/19/2021   Influenza-Unspecified 01/13/2016, 02/23/2017, 03/02/2018, 01/21/2019, 04/17/2020   Moderna Sars-Covid-2 Vaccination 03/03/2020   PFIZER Comirnaty(Gray Top)Covid-19 Tri-Sucrose Vaccine 08/05/2019, 08/27/2019   PFIZER(Purple Top)SARS-COV-2 Vaccination 08/05/2019, 08/27/2019   PPD Test 05/15/2020   Pneumococcal Conjugate-13 01/20/2015   Pneumococcal Polysaccharide-23 12/25/2015   Tdap 03/11/2014    TDAP status: Up to date  Flu Vaccine status: Up to date  Pneumococcal vaccine status: Up to date  Covid-19 vaccine status: Information provided on how to obtain vaccines.   Qualifies for Shingles Vaccine? Yes   Zostavax completed No   Shingrix Completed?: No.    Education has been provided regarding the importance of this vaccine. Patient has been advised to call insurance company to determine out of pocket expense if they have not yet received this vaccine. Advised may also receive vaccine at local pharmacy or Health Dept. Verbalized acceptance and understanding.  Screening Tests Health Maintenance  Topic Date Due   OPHTHALMOLOGY EXAM  12/24/2020   HEMOGLOBIN A1C  04/02/2021   URINE MICROALBUMIN  07/03/2021   Zoster Vaccines- Shingrix (1 of 2) 09/12/2021 (Originally 04/07/2013)   FOOT EXAM  07/03/2021   COLONOSCOPY (Pts 45-66yr Insurance coverage will need to be confirmed)  05/09/2022   MAMMOGRAM  11/17/2022   TETANUS/TDAP  03/11/2024    INFLUENZA VACCINE  Completed   COVID-19 Vaccine  Completed   Hepatitis C Screening  Completed   HIV Screening  Completed   HPV VACCINES  Aged Out    Health Maintenance  Health Maintenance Due  Topic Date Due   OPHTHALMOLOGY EXAM  12/24/2020   HEMOGLOBIN A1C  04/02/2021   URINE MICROALBUMIN  07/03/2021    Colorectal cancer screening: Type of screening: Colonoscopy. Completed 2021per patinet. Repeat every   years  Mammogram status: Completed 2022. Repeat every year    Lung Cancer Screening: (Low Dose CT Chest recommended if Age 59-80years, 30 pack-year currently smoking OR have quit w/in 15years.) does not qualify.  Lung Cancer Screening Referral:   Additional Screening:  Hepatitis C Screening: does not qualify; Completed 2020  Vision Screening: Recommended annual ophthalmology exams for early detection of glaucoma and other disorders of the eye. Is the patient up to date with their annual eye exam?  Yes  Who is the provider or what is the name of the office in which the patient attends annual eye exams? bell If pt is not established with a provider, would they like to be referred to a provider to establish care? No .   Dental Screening: Recommended annual dental exams for proper oral hygiene  Community Resource Referral / Chronic Care Management: CRR required  this visit?  No   CCM required this visit?  No      Plan:     I have personally reviewed and noted the following in the patients chart:   Medical and social history Use of alcohol, tobacco or illicit drugs  Current medications and supplements including opioid prescriptions.  Functional ability and status Nutritional status Physical activity Advanced directives List of other physicians Hospitalizations, surgeries, and ER visits in previous 12 months Vitals Screenings to include cognitive, depression, and falls Referrals and appointments  In addition, I have reviewed and discussed with patient certain  preventive protocols, quality metrics, and best practice recommendations. A written personalized care plan for preventive services as well as general preventive health recommendations were provided to patient.     Leroy Kennedy, LPN   08/14/2705   Nurse Notes:

## 2021-06-15 NOTE — Patient Instructions (Signed)
Ms. Hannah Calhoun , Thank you for taking time to come for your Medicare Wellness Visit. I appreciate your ongoing commitment to your health goals. Please review the following plan we discussed and let me know if I can assist you in the future.   Screening recommendations/referrals: Colonoscopy: up to date Mammogram: up to date  Recommended yearly ophthalmology/optometry visit for glaucoma screening and checkup Recommended yearly dental visit for hygiene and checkup  Vaccinations: Influenza vaccine:  Pneumococcal vaccine: up to date Tdap vaccine: up to date Shingles vaccine: Education provided    Advanced directives: education provided  Conditions/risks identified:   Next appointment: 07-05-2021 10:00 [facility] 59 Years and Older, Female Preventive care refers to lifestyle choices and visits with your health care provider that can promote health and wellness. What does preventive care include? A yearly physical exam. This is also called an annual well check. Dental exams once or twice a year. Routine eye exams. Ask your health care provider how often you should have your eyes checked. Personal lifestyle choices, including: Daily care of your teeth and gums. Regular physical activity. Eating a healthy diet. Avoiding tobacco and drug use. Limiting alcohol use. Practicing safe sex. Taking low-dose aspirin every day. Taking vitamin and mineral supplements as recommended by your health care provider. What happens during an annual well check? The services and screenings done by your health care provider during your annual well check will depend on your age, overall health, lifestyle risk factors, and family history of disease. Counseling  Your health care provider may ask you questions about your: Alcohol use. Tobacco use. Drug use. Emotional well-being. Home and relationship well-being. Sexual activity. Eating habits. History of falls. Memory and ability to  understand (cognition). Work and work Statistician. Reproductive health. Screening  You may have the following tests or measurements: Height, weight, and BMI. Blood pressure. Lipid and cholesterol levels. These may be checked every 5 years, or more frequently if you are over 26 years old. Skin check. Lung cancer screening. You may have this screening every year starting at age 12 if you have a 30-pack-year history of smoking and currently smoke or have quit within the past 15 years. Fecal occult blood test (FOBT) of the stool. You may have this test every year starting at age 41. Flexible sigmoidoscopy or colonoscopy. You may have a sigmoidoscopy every 5 years or a colonoscopy every 10 years starting at age 101. Hepatitis C blood test. Hepatitis B blood test. Sexually transmitted disease (STD) testing. Diabetes screening. This is done by checking your blood sugar (glucose) after you have not eaten for a while (fasting). You may have this done every 1-3 years. Bone density scan. This is done to screen for osteoporosis. You may have this done starting at age 38. Mammogram. This may be done every 1-2 years. Talk to your health care provider about how often you should have regular mammograms. Talk with your health care provider about your test results, treatment options, and if necessary, the need for more tests. Vaccines  Your health care provider may recommend certain vaccines, such as: Influenza vaccine. This is recommended every year. Tetanus, diphtheria, and acellular pertussis (Tdap, Td) vaccine. You may need a Td booster every 10 years. Zoster vaccine. You may need this after age 66. Pneumococcal 13-valent conjugate (PCV13) vaccine. One dose is recommended after age 19. Pneumococcal polysaccharide (PPSV23) vaccine. One dose is recommended after age 37. Talk to your health care provider about which screenings and vaccines you need  and how often you need them. This information is not  intended to replace advice given to you by your health care provider. Make sure you discuss any questions you have with your health care provider. Document Released: 05/22/2015 Document Revised: 01/13/2016 Document Reviewed: 02/24/2015 Elsevier Interactive Patient Education  2017 [place] Prevention in the Home Falls can cause injuries. They can happen to people of all ages. There are many things you can do to make your home safe and to help prevent falls. What can I do on the outside of my home? Regularly fix the edges of walkways and driveways and fix any cracks. Remove anything that might make you trip as you walk through a door, such as a raised step or threshold. Trim any bushes or trees on the path to your home. Use bright outdoor lighting. Clear any walking paths of anything that might make someone trip, such as rocks or tools. Regularly check to see if handrails are loose or broken. Make sure that both sides of any steps have handrails. Any raised decks and porches should have guardrails on the edges. Have any leaves, snow, or ice cleared regularly. Use sand or salt on walking paths during winter. Clean up any spills in your garage right away. This includes oil or grease spills. What can I do in the bathroom? Use night lights. Install grab bars by the toilet and in the tub and shower. Do not use towel bars as grab bars. Use non-skid mats or decals in the tub or shower. If you need to sit down in the shower, use a plastic, non-slip stool. Keep the floor dry. Clean up any water that spills on the floor as soon as it happens. Remove soap buildup in the tub or shower regularly. Attach bath mats securely with double-sided non-slip rug tape. Do not have throw rugs and other things on the floor that can make you trip. What can I do in the bedroom? Use night lights. Make sure that you have a light by your bed that is easy to reach. Do not use any sheets or blankets that are  too big for your bed. They should not hang down onto the floor. Have a firm chair that has side arms. You can use this for support while you get dressed. Do not have throw rugs and other things on the floor that can make you trip. What can I do in the kitchen? Clean up any spills right away. Avoid walking on wet floors. Keep items that you use a lot in easy-to-reach places. If you need to reach something above you, use a strong step stool that has a grab bar. Keep electrical cords out of the way. Do not use floor polish or wax that makes floors slippery. If you must use wax, use non-skid floor wax. Do not have throw rugs and other things on the floor that can make you trip. What can I do with my stairs? Do not leave any items on the stairs. Make sure that there are handrails on both sides of the stairs and use them. Fix handrails that are broken or loose. Make sure that handrails are as long as the stairways. Check any carpeting to make sure that it is firmly attached to the stairs. Fix any carpet that is loose or worn. Avoid having throw rugs at the top or bottom of the stairs. If you do have throw rugs, attach them to the floor with carpet tape. Make sure that you  have a light switch at the top of the stairs and the bottom of the stairs. If you do not have them, ask someone to add them for you. What else can I do to help prevent falls? Wear shoes that: Do not have high heels. Have rubber bottoms. Are comfortable and fit you well. Are closed at the toe. Do not wear sandals. If you use a stepladder: Make sure that it is fully opened. Do not climb a closed stepladder. Make sure that both sides of the stepladder are locked into place. Ask someone to hold it for you, if possible. Clearly mark and make sure that you can see: Any grab bars or handrails. First and last steps. Where the edge of each step is. Use tools that help you move around (mobility aids) if they are needed. These  include: Canes. Walkers. Scooters. Crutches. Turn on the lights when you go into a dark area. Replace any light bulbs as soon as they burn out. Set up your furniture so you have a clear path. Avoid moving your furniture around. If any of your floors are uneven, fix them. If there are any pets around you, be aware of where they are. Review your medicines with your doctor. Some medicines can make you feel dizzy. This can increase your chance of falling. Ask your doctor what other things that you can do to help prevent falls. This information is not intended to replace advice given to you by your health care provider. Make sure you discuss any questions you have with your health care provider. Document Released: 02/19/2009 Document Revised: 10/01/2015 Document Reviewed: 05/30/2014 Elsevier Interactive Patient Education  2017 [employer].

## 2021-06-16 NOTE — Telephone Encounter (Signed)
Medication picked up by the patient.  

## 2021-06-29 ENCOUNTER — Telehealth: Payer: Self-pay

## 2021-06-29 NOTE — Telephone Encounter (Signed)
Called and notified patient that we received 5 boxes of Ozempic this morning for her. Advised her that they were ready for pick up.

## 2021-07-05 ENCOUNTER — Other Ambulatory Visit: Payer: Self-pay

## 2021-07-05 ENCOUNTER — Ambulatory Visit (INDEPENDENT_AMBULATORY_CARE_PROVIDER_SITE_OTHER): Payer: HMO | Admitting: Nurse Practitioner

## 2021-07-05 ENCOUNTER — Encounter: Payer: Self-pay | Admitting: Nurse Practitioner

## 2021-07-05 VITALS — BP 111/68 | HR 64 | Temp 98.7°F | Ht 65.0 in | Wt 206.8 lb

## 2021-07-05 DIAGNOSIS — E559 Vitamin D deficiency, unspecified: Secondary | ICD-10-CM

## 2021-07-05 DIAGNOSIS — E1159 Type 2 diabetes mellitus with other circulatory complications: Secondary | ICD-10-CM

## 2021-07-05 DIAGNOSIS — Z23 Encounter for immunization: Secondary | ICD-10-CM

## 2021-07-05 DIAGNOSIS — F32A Depression, unspecified: Secondary | ICD-10-CM

## 2021-07-05 DIAGNOSIS — E785 Hyperlipidemia, unspecified: Secondary | ICD-10-CM | POA: Diagnosis not present

## 2021-07-05 DIAGNOSIS — Z0000 Encounter for general adult medical examination without abnormal findings: Secondary | ICD-10-CM

## 2021-07-05 DIAGNOSIS — E119 Type 2 diabetes mellitus without complications: Secondary | ICD-10-CM | POA: Diagnosis not present

## 2021-07-05 DIAGNOSIS — F419 Anxiety disorder, unspecified: Secondary | ICD-10-CM | POA: Diagnosis not present

## 2021-07-05 DIAGNOSIS — I152 Hypertension secondary to endocrine disorders: Secondary | ICD-10-CM | POA: Diagnosis not present

## 2021-07-05 DIAGNOSIS — E1169 Type 2 diabetes mellitus with other specified complication: Secondary | ICD-10-CM

## 2021-07-05 LAB — URINALYSIS, ROUTINE W REFLEX MICROSCOPIC
Bilirubin, UA: NEGATIVE
Glucose, UA: NEGATIVE
Leukocytes,UA: NEGATIVE
Nitrite, UA: NEGATIVE
RBC, UA: NEGATIVE
Specific Gravity, UA: 1.025 (ref 1.005–1.030)
Urobilinogen, Ur: 0.2 mg/dL (ref 0.2–1.0)
pH, UA: 6.5 (ref 5.0–7.5)

## 2021-07-05 LAB — MICROSCOPIC EXAMINATION
RBC, Urine: NONE SEEN /hpf (ref 0–2)
WBC, UA: NONE SEEN /hpf (ref 0–5)

## 2021-07-05 LAB — MICROALBUMIN, URINE WAIVED
Creatinine, Urine Waived: 300 mg/dL (ref 10–300)
Microalb, Ur Waived: 80 mg/L — ABNORMAL HIGH (ref 0–19)

## 2021-07-05 MED ORDER — HYDROCHLOROTHIAZIDE 25 MG PO TABS: 25.0000 mg | ORAL_TABLET | Freq: Every day | ORAL | 1 refills | Status: DC

## 2021-07-05 MED ORDER — POTASSIUM CHLORIDE CRYS ER 20 MEQ PO TBCR: 20.0000 meq | EXTENDED_RELEASE_TABLET | Freq: Two times a day (BID) | ORAL | 1 refills | Status: DC

## 2021-07-05 MED ORDER — AMLODIPINE BESYLATE 10 MG PO TABS: 10.0000 mg | ORAL_TABLET | Freq: Every day | ORAL | 1 refills | Status: DC

## 2021-07-05 MED ORDER — GABAPENTIN 300 MG PO CAPS: ORAL_CAPSULE | ORAL | 1 refills | Status: DC

## 2021-07-05 MED ORDER — OZEMPIC (0.25 OR 0.5 MG/DOSE) 2 MG/1.5ML ~~LOC~~ SOPN: 0.5000 mg | PEN_INJECTOR | SUBCUTANEOUS | 0 refills | Status: DC

## 2021-07-05 MED ORDER — GLIPIZIDE 5 MG PO TABS: ORAL_TABLET | ORAL | 1 refills | Status: DC

## 2021-07-05 MED ORDER — ROSUVASTATIN CALCIUM 5 MG PO TABS: 5.0000 mg | ORAL_TABLET | Freq: Every day | ORAL | 1 refills | Status: DC

## 2021-07-05 MED ORDER — METOPROLOL SUCCINATE ER 25 MG PO TB24: 25.0000 mg | ORAL_TABLET | Freq: Every day | ORAL | 1 refills | Status: DC

## 2021-07-05 NOTE — Assessment & Plan Note (Addendum)
Chronic, stable. Labs ordered today. Continue current regimen of Metoprolol 25mg , HCTZ 25mg  daily,and Amlodipine 10mg  daily. Follow up in 6 months.  Call sooner if concerns arise.

## 2021-07-05 NOTE — Assessment & Plan Note (Signed)
Chronic.  Controlled.  Continue with current medication regimen on Crestor 5mg.  Labs ordered today.  Return to clinic in 6 months for reevaluation.  Call sooner if concerns arise.   

## 2021-07-05 NOTE — Progress Notes (Signed)
BP 111/68    Pulse 64    Temp 98.7 F (37.1 C) (Oral)    Ht 5\' 5"  (1.651 m)    Wt 206 lb 12.8 oz (93.8 kg)    LMP  (LMP Unknown)    SpO2 96%    BMI 34.41 kg/m    Subjective:    Patient ID: Hannah Calhoun, female    DOB: 09-11-1960, 61 y.o.   MRN: [id number]  HPI: Hannah Calhoun is a 61 y.o. female presenting on 07/05/2021 for comprehensive medical examination. Current medical complaints include:none  She currently lives with: Menopausal Symptoms: no  HYPERTENSION / HYPERLIPIDEMIA Satisfied with current treatment? yes Duration of hypertension: years BP monitoring frequency: monthly BP range: 110-120/80 BP medication side effects: no Past BP Metoprolol 25mg , amlodipine, and HCTZ meds:  Duration of hyperlipidemia: years Cholesterol medication side effects: no Cholesterol supplements: none Past cholesterol medications: rosuvastatin (crestor) Medication compliance: excellent compliance Aspirin: no Recent stressors: no Recurrent headaches: no Visual changes: no Palpitations: no Dyspnea: no Chest pain: no Lower extremity edema: no Dizzy/lightheaded: no  DIABETES Hypoglycemic episodes:no Polydipsia/polyuria: no Visual disturbance: no Chest pain: no Paresthesias: no Glucose Monitoring: no  Accucheck frequency: Daily  Fasting glucose: 120s  Post prandial:  Evening:  Before meals: Taking Insulin?: no  Long acting insulin:  Short acting insulin: Blood Pressure Monitoring: monthly Retinal Examination: Up to Date Foot Exam: Up to Date Diabetic Education: Not Completed Pneumovax: Up to Date Influenza: Up to Date Aspirin: no  MOOD Patient states she is doing well.  She denies concerns at visit today.   Depression Screen done today and results listed below:  Depression screen [facility] 2/9 07/05/2021 06/15/2021 07/03/2020 09/05/2019 04/28/2015  Decreased Interest 0 0 0 1 0  Down, Depressed, Hopeless 0 0 0 1 0  PHQ - 2 Score 0 0 0 2 0  Altered sleeping 0 - - 2 -   Tired, decreased energy 1 - - 2 -  Change in appetite 0 - - 0 -  Feeling bad or failure about yourself  0 - - 0 -  Trouble concentrating 1 - - 3 -  Moving slowly or fidgety/restless 0 - - 0 -  Suicidal thoughts 0 - - 0 -  PHQ-9 Score 2 - - 9 -  Difficult doing work/chores Not difficult at all - - - -    The patient does not have a history of falls. I did complete a risk assessment for falls. A plan of care for falls was documented.   Past Medical History:  Past Medical History:  Diagnosis Date   Allergy    Arthritis    Chicken pox    Depression    Diabetes mellitus without complication ([place])    Diverticulitis    GERD (gastroesophageal reflux disease)    History of colon polyps    Hyperlipidemia    Hypertension     Surgical History:  Past Surgical History:  Procedure Laterality Date   BREAST BIOPSY Right 1990   EXCISIONAL - NEG   BREAST SURGERY Right 1990   CHOLECYSTECTOMY     KNEE ARTHROSCOPY  2010   NASAL SEPTUM SURGERY  2010   TONSILLECTOMY     TOTAL ABDOMINAL HYSTERECTOMY  2009   TOTAL HIP ARTHROPLASTY      Medications:  Current Outpatient Medications on File Prior to Visit  Medication Sig   acetaminophen (TYLENOL) 500 MG tablet Take 650 mg by mouth 2 (two) times daily.   Cholecalciferol 25 MCG (  1000 UT) capsule Take 1,000 Units by mouth daily.   fluticasone (FLONASE) 50 MCG/ACT nasal spray PLACE 1 SPRAY INTO BOTH NOSTRILS DAILY.   Multiple Vitamin (MULTI-VITAMIN) tablet Take 1 tablet by mouth daily.   No current facility-administered medications on file prior to visit.    Allergies:  Allergies  Allergen Reactions   Doxycycline Diarrhea and Nausea And Vomiting   Nsaids Other (See Comments)    Bleeding ulcer   Other Other (See Comments)   Tolmetin     Other reaction(s): Other (See Comments) Bleeding ulcer   Ace Inhibitors Other (See Comments) and Rash    Significant kidney insufficiency-  each time lisinopril restarted Significant AKI each time  lisinopril restarted    Aspirin Rash    Bleeding ulcers    Social History:  Social History   Socioeconomic History   Marital status: Married    Spouse name: Not on file   Number of children: Not on file   Years of education: Not on file   Highest education level: Not on file  Occupational History   Not on file  Tobacco Use   Smoking status: Former    Packs/day: 0.50    Years: 15.00    Pack years: 7.50    Types: Cigarettes    Quit date: 06/12/2005    Years since quitting: 16.0   Smokeless tobacco: Never   Tobacco comments:    quit 2007  Vaping Use   Vaping Use: Never used  Substance and Sexual Activity   Alcohol use: Not Currently    Alcohol/week: 0.0 standard drinks   Drug use: No   Sexual activity: Not Currently  Other Topics Concern   Not on file  Social History Narrative   Not on file   Social Determinants of Health   Financial Resource Strain: Low Risk    Difficulty of Paying Living Expenses: Not very hard  Food Insecurity: No Food Insecurity   Worried About Programme researcher, broadcasting/film/videounning Out of Food in the Last Year: Never true   Ran Out of Food in the Last Year: Never true  Transportation Needs: No Transportation Needs   Lack of Transportation (Medical): No   Lack of Transportation (Non-Medical): No  Physical Activity: Sufficiently Active   Days of Exercise per Week: 5 days   Minutes of Exercise per Session: 30 min  Stress: No Stress Concern Present   Feeling of Stress : Not at all  Social Connections: Moderately Integrated   Frequency of Communication with Friends and Family: More than three times a week   Frequency of Social Gatherings with Friends and Family: Twice a week   Attends Religious Services: Never   Database administratorActive Member of Clubs or Organizations: Yes   Attends Engineer, structuralClub or Organization Meetings: 1 to 4 times per year   Marital Status: Married  Catering managerntimate Partner Violence: Not At Risk   Fear of Current or Ex-Partner: No   Emotionally Abused: No   Physically Abused: No    Sexually Abused: No   Social History   Tobacco Use  Smoking Status Former   Packs/day: 0.50   Years: 15.00   Pack years: 7.50   Types: Cigarettes   Quit date: 06/12/2005   Years since quitting: 16.0  Smokeless Tobacco Never  Tobacco Comments   quit 2007   Social History   Substance and Sexual Activity  Alcohol Use Not Currently   Alcohol/week: 0.0 standard drinks    Family History:  Family History  Problem Relation Age of Onset  Alcohol abuse Mother    Hyperlipidemia Mother    Heart disease Mother    Hypertension Mother    Heart attack Mother    Alcohol abuse Father    Hyperlipidemia Father    Heart disease Father    Hypertension Father    Heart attack Father    Alcohol abuse Maternal Aunt    Hypertension Maternal Aunt    Kidney disease Maternal Aunt    Alcohol abuse Maternal Uncle    Hypertension Maternal Uncle    Alcohol abuse Paternal Aunt    Birth defects Paternal Aunt        Colon, Breast, Lung   Heart disease Paternal Aunt    Breast cancer Paternal Aunt    Alcohol abuse Paternal Uncle    Heart disease Paternal Uncle    Stroke Maternal Grandmother    Hypertension Maternal Grandmother    Alcohol abuse Maternal Grandfather    Heart disease Maternal Grandfather    Hypertension Maternal Grandfather     Past medical history, surgical history, medications, allergies, family history and social history reviewed with patient today and changes made to appropriate areas of the chart.   Review of Systems  HENT:         Denies vision changes.  Eyes:  Negative for blurred vision and double vision.  Respiratory:  Negative for shortness of breath.   Cardiovascular:  Negative for chest pain, palpitations and leg swelling.  Neurological:  Negative for dizziness, tingling and headaches.  Endo/Heme/Allergies:  Negative for polydipsia.       Denies Polyuria  Psychiatric/Behavioral:  Negative for depression. The patient is not nervous/anxious.   All other ROS negative  except what is listed above and in the HPI.      Objective:    BP 111/68    Pulse 64    Temp 98.7 F (37.1 C) (Oral)    Ht 5\' 5"  (1.651 m)    Wt 206 lb 12.8 oz (93.8 kg)    LMP  (LMP Unknown)    SpO2 96%    BMI 34.41 kg/m   Wt Readings from Last 3 Encounters:  07/05/21 206 lb 12.8 oz (93.8 kg)  01/19/21 214 lb 12.8 oz (97.4 kg)  09/30/20 214 lb (97.1 kg)    Physical Exam Vitals and nursing note reviewed.  Constitutional:      General: She is awake. She is not in acute distress.    Appearance: She is well-developed. She is not ill-appearing.  HENT:     Head: Normocephalic and atraumatic.     Right Ear: Hearing, tympanic membrane, ear canal and external ear normal. No drainage.     Left Ear: Hearing, tympanic membrane, ear canal and external ear normal. No drainage.     Nose: Nose normal.     Right Sinus: No maxillary sinus tenderness or frontal sinus tenderness.     Left Sinus: No maxillary sinus tenderness or frontal sinus tenderness.     Mouth/Throat:     Mouth: Mucous membranes are moist.     Pharynx: Oropharynx is clear. Uvula midline. No pharyngeal swelling, oropharyngeal exudate or posterior oropharyngeal erythema.  Eyes:     General: Lids are normal.        Right eye: No discharge.        Left eye: No discharge.     Extraocular Movements: Extraocular movements intact.     Conjunctiva/sclera: Conjunctivae normal.     Pupils: Pupils are equal, round, and reactive to light.  Visual Fields: Right eye visual fields normal and left eye visual fields normal.  Neck:     Thyroid: No thyromegaly.     Vascular: No carotid bruit.     Trachea: Trachea normal.  Cardiovascular:     Rate and Rhythm: Normal rate and regular rhythm.     Heart sounds: Normal heart sounds. No murmur heard.   No gallop.  Pulmonary:     Effort: Pulmonary effort is normal. No accessory muscle usage or respiratory distress.     Breath sounds: Normal breath sounds.  Chest:  Breasts:    Right: Normal.      Left: Normal.  Abdominal:     General: Bowel sounds are normal.     Palpations: Abdomen is soft. There is no hepatomegaly or splenomegaly.     Tenderness: There is no abdominal tenderness.  Musculoskeletal:        General: Normal range of motion.     Cervical back: Normal range of motion and neck supple.     Right lower leg: No edema.     Left lower leg: No edema.  Lymphadenopathy:     Head:     Right side of head: No submental, submandibular, tonsillar, preauricular or posterior auricular adenopathy.     Left side of head: No submental, submandibular, tonsillar, preauricular or posterior auricular adenopathy.     Cervical: No cervical adenopathy.     Upper Body:     Right upper body: No supraclavicular, axillary or pectoral adenopathy.     Left upper body: No supraclavicular, axillary or pectoral adenopathy.  Skin:    General: Skin is warm and dry.     Capillary Refill: Capillary refill takes less than 2 seconds.     Findings: No rash.  Neurological:     Mental Status: She is alert and oriented to person, place, and time.     Gait: Gait is intact.     Deep Tendon Reflexes: Reflexes are normal and symmetric.     Reflex Scores:      Brachioradialis reflexes are 2+ on the right side and 2+ on the left side.      Patellar reflexes are 2+ on the right side and 2+ on the left side. Psychiatric:        Attention and Perception: Attention normal.        Mood and Affect: Mood normal.        Speech: Speech normal.        Behavior: Behavior normal. Behavior is cooperative.        Thought Content: Thought content normal.        Judgment: Judgment normal.    Results for orders placed or performed in visit on 09/30/20  HM DIABETES EYE EXAM  Result Value Ref Range   HM Diabetic Eye Exam No Retinopathy No Retinopathy      Assessment & Plan:   Problem List Items Addressed This Visit       Cardiovascular and Mediastinum   Hypertension associated with diabetes ([place])    Chronic,  stable. Labs ordered today. Continue current regimen of Metoprolol 25mg , HCTZ 25mg  daily,and Amlodipine 10mg  daily. Follow up in 6 months.  Call sooner if concerns arise.      Relevant Medications   amLODipine (NORVASC) 10 MG tablet   glipiZIDE (GLUCOTROL) 5 MG tablet   hydrochlorothiazide (HYDRODIURIL) 25 MG tablet   metoprolol succinate (TOPROL-XL) 25 MG 24 hr tablet   rosuvastatin (CRESTOR) 5 MG tablet   Semaglutide,0.25 or  0.5MG /DOS, (OZEMPIC, 0.25 OR 0.5 MG/DOSE,) 2 MG/1.5ML SOPN     Endocrine   DM2 (diabetes mellitus, type 2) ([facility])    Chronic, stable. Labs ordered today. Continue current regimen of Glipizide 5mg  daily. Follow up in 6 months.  Call sooner if concerns arise.      Relevant Medications   glipiZIDE (GLUCOTROL) 5 MG tablet   rosuvastatin (CRESTOR) 5 MG tablet   Semaglutide,0.25 or 0.5MG /DOS, (OZEMPIC, 0.25 OR 0.5 MG/DOSE,) 2 MG/1.5ML SOPN   Other Relevant Orders   HgB A1c   Microalbumin, Urine Waived   Hyperlipidemia associated with type 2 diabetes mellitus ([facility])    Chronic.  Controlled.  Continue with current medication regimen on Crestor 5mg .  Labs ordered today.  Return to clinic in 6 months for reevaluation.  Call sooner if concerns arise.        Relevant Medications   amLODipine (NORVASC) 10 MG tablet   glipiZIDE (GLUCOTROL) 5 MG tablet   hydrochlorothiazide (HYDRODIURIL) 25 MG tablet   metoprolol succinate (TOPROL-XL) 25 MG 24 hr tablet   rosuvastatin (CRESTOR) 5 MG tablet   Semaglutide,0.25 or 0.5MG /DOS, (OZEMPIC, 0.25 OR 0.5 MG/DOSE,) 2 MG/1.5ML SOPN     Other   Anxiety and depression    Chronic.  Controlled without medication.  Labs ordered today.  Return to clinic in 6 months for reevaluation.  Call sooner if concerns arise.        Vitamin D deficiency    Labs ordered today. Will make recommendations based on lab results.      Relevant Orders   Vitamin D (25 hydroxy)   Other Visit Diagnoses     Annual physical exam    -  Primary   Health  maintenance reviewed during visit. Labs ordered today. Up to date on Colonoscopy and Mammogram. Will request eye exam.   Relevant Orders   CBC with Differential/Platelet   Comprehensive metabolic panel   Lipid panel   TSH   Urinalysis, Routine w reflex microscopic   Need for shingles vaccine       Relevant Orders   Varicella-zoster vaccine IM (Shingrix)        Follow up plan: Return in about 6 months (around 01/02/2022) for HTN, HLD, DM2 FU.   LABORATORY TESTING:  - Pap smear: not applicable  IMMUNIZATIONS:   - Tdap: Tetanus vaccination status reviewed: last tetanus booster within 10 years. - Influenza: Up to date - Pneumovax: Up to date - Prevnar: Up to date - COVID: Up to date - HPV: Not applicable - Shingrix vaccine:  Discussed at visit today  SCREENING: -Mammogram: Up to date  - Colonoscopy: Up to date  - Bone Density: Not applicable  -Hearing Test: Not applicable  -Spirometry: Not applicable   PATIENT COUNSELING:   Advised to take 1 mg of folate supplement per day if capable of pregnancy.   Sexuality: Discussed sexually transmitted diseases, partner selection, use of condoms, avoidance of unintended pregnancy  and contraceptive alternatives.   Advised to avoid cigarette smoking.  I discussed with the patient that most people either abstain from alcohol or drink within safe limits (<=14/week and <=4 drinks/occasion for males, <=7/weeks and <= 3 drinks/occasion for females) and that the risk for alcohol disorders and other health effects rises proportionally with the number of drinks per week and how often a drinker exceeds daily limits.  Discussed cessation/primary prevention of drug use and availability of treatment for abuse.   Diet: Encouraged to adjust caloric intake to maintain  or achieve  ideal body weight, to reduce intake of dietary saturated fat and total fat, to limit sodium intake by avoiding high sodium foods and not adding table salt, and to maintain  adequate dietary potassium and calcium preferably from fresh fruits, vegetables, and low-fat dairy products.    stressed the importance of regular exercise  Injury prevention: Discussed safety belts, safety helmets, smoke detector, smoking near bedding or upholstery.   Dental health: Discussed importance of regular tooth brushing, flossing, and dental visits.    NEXT PREVENTATIVE PHYSICAL DUE IN 1 YEAR. Return in about 6 months (around 01/02/2022) for HTN, HLD, DM2 FU.

## 2021-07-05 NOTE — Assessment & Plan Note (Signed)
Chronic, stable. Labs ordered today. Continue current regimen of Glipizide 5mg  daily. Follow up in 6 months.  Call sooner if concerns arise.

## 2021-07-05 NOTE — Assessment & Plan Note (Signed)
Chronic.  Controlled without medication..  Labs ordered today.  Return to clinic in 6 months for reevaluation.  Call sooner if concerns arise.  ° °

## 2021-07-05 NOTE — Assessment & Plan Note (Signed)
Labs ordered today.  Will make recommendations based on lab results. ?

## 2021-07-06 LAB — COMPREHENSIVE METABOLIC PANEL
ALT: 25 IU/L (ref 0–32)
AST: 21 IU/L (ref 0–40)
Albumin/Globulin Ratio: 1.8 (ref 1.2–2.2)
Albumin: 4.5 g/dL (ref 3.8–4.9)
Alkaline Phosphatase: 58 IU/L (ref 44–121)
BUN/Creatinine Ratio: 16 (ref 9–23)
BUN: 14 mg/dL (ref 6–24)
Bilirubin Total: 0.2 mg/dL (ref 0.0–1.2)
CO2: 26 mmol/L (ref 20–29)
Calcium: 9.4 mg/dL (ref 8.7–10.2)
Chloride: 102 mmol/L (ref 96–106)
Creatinine, Ser: 0.87 mg/dL (ref 0.57–1.00)
Globulin, Total: 2.5 g/dL (ref 1.5–4.5)
Glucose: 117 mg/dL — ABNORMAL HIGH (ref 70–99)
Potassium: 3.7 mmol/L (ref 3.5–5.2)
Sodium: 143 mmol/L (ref 134–144)
Total Protein: 7 g/dL (ref 6.0–8.5)
eGFR: 77 mL/min/{1.73_m2} (ref >59)

## 2021-07-06 LAB — LIPID PANEL
Chol/HDL Ratio: 3.1 ratio (ref 0.0–4.4)
Cholesterol, Total: 188 mg/dL (ref 100–199)
HDL: 61 mg/dL (ref >39)
LDL Chol Calc (NIH): 107 mg/dL — ABNORMAL HIGH (ref 0–99)
Triglycerides: 113 mg/dL (ref 0–149)
VLDL Cholesterol Cal: 20 mg/dL (ref 5–40)

## 2021-07-06 LAB — CBC WITH DIFFERENTIAL/PLATELET
Basophils Absolute: 0.1 10*3/uL (ref 0.0–0.2)
Basos: 1 %
EOS (ABSOLUTE): 0 10*3/uL (ref 0.0–0.4)
Eos: 0 %
Hematocrit: 42 % (ref 34.0–46.6)
Hemoglobin: 14.2 g/dL (ref 11.1–15.9)
Immature Grans (Abs): 0 10*3/uL (ref 0.0–0.1)
Immature Granulocytes: 0 %
Lymphocytes Absolute: 3 10*3/uL (ref 0.7–3.1)
Lymphs: 32 %
MCH: 32.2 pg (ref 26.6–33.0)
MCHC: 33.8 g/dL (ref 31.5–35.7)
MCV: 95 fL (ref 79–97)
Monocytes Absolute: 0.4 10*3/uL (ref 0.1–0.9)
Monocytes: 5 %
Neutrophils Absolute: 5.8 10*3/uL (ref 1.4–7.0)
Neutrophils: 62 %
Platelets: 421 10*3/uL (ref 150–450)
RBC: 4.41 x10E6/uL (ref 3.77–5.28)
RDW: 11.9 % (ref 11.7–15.4)
WBC: 9.3 10*3/uL (ref 3.4–10.8)

## 2021-07-06 LAB — HEMOGLOBIN A1C
Est. average glucose Bld gHb Est-mCnc: 134 mg/dL
Hgb A1c MFr Bld: 6.3 % — ABNORMAL HIGH (ref 4.8–5.6)

## 2021-07-06 LAB — TSH: TSH: 1.7 u[IU]/mL (ref 0.450–4.500)

## 2021-07-06 LAB — VITAMIN D 25 HYDROXY (VIT D DEFICIENCY, FRACTURES): Vit D, 25-Hydroxy: 41.8 ng/mL (ref 30.0–100.0)

## 2021-07-06 NOTE — Progress Notes (Signed)
Please let patient know that her lab work shows that her A1c is well controlled at 6.3.  Her liver, kidneys, and electrolytes look good. Cholesterol is slightly elevated but no medication changes at this time. Recommend following a low fat diet. No other concerns at this time. Continue with current medication regimen.  Follow up as discussed.

## 2021-08-16 ENCOUNTER — Encounter: Payer: Self-pay | Admitting: Nurse Practitioner

## 2021-08-16 NOTE — Telephone Encounter (Signed)
Given her diabetes, lets get her in ASAP ?

## 2021-08-16 NOTE — Telephone Encounter (Signed)
Pt scheduled this Thursday at 3 pm ?

## 2021-08-19 ENCOUNTER — Ambulatory Visit (INDEPENDENT_AMBULATORY_CARE_PROVIDER_SITE_OTHER): Payer: HMO | Admitting: Family Medicine

## 2021-08-19 ENCOUNTER — Encounter: Payer: Self-pay | Admitting: Family Medicine

## 2021-08-19 VITALS — BP 126/72 | HR 61 | Temp 98.3°F | Wt 205.6 lb

## 2021-08-19 DIAGNOSIS — B353 Tinea pedis: Secondary | ICD-10-CM | POA: Diagnosis not present

## 2021-08-19 DIAGNOSIS — M791 Myalgia, unspecified site: Secondary | ICD-10-CM

## 2021-08-19 MED ORDER — CLOTRIMAZOLE-BETAMETHASONE 1-0.05 % EX CREA: 1.0000 "application " | TOPICAL_CREAM | Freq: Every day | CUTANEOUS | 0 refills | Status: DC

## 2021-08-19 NOTE — Progress Notes (Signed)
? ?BP 126/72   Pulse 61   Temp 98.3 ?F (36.8 ?C)   Wt 205 lb 9.6 oz (93.3 kg)   LMP  (LMP Unknown)   SpO2 99%   BMI 34.21 kg/m?   ? ?Subjective:  ? ? Patient ID: Hannah Calhoun, female    DOB: August 26, 1960, 61 y.o.   MRN: [id number] ? ?HPI: ?Hannah Calhoun is a 61 y.o. female ? ?Chief Complaint  ?Patient presents with  ? foot concern  ?  Patient states she has a sore between her fourth and fifth toe on her right foot. Patient states it has been there for months but it more painful now.   ? ?RASH ?Duration:  couple of weeks  ?Location: between her 4th and 5th toes on her R foot  ?Itching: yes ?Burning: yes ?Redness: no ?Oozing: no ?Scaling: yes ?Blisters: no ?Painful: yes ?Fevers: no ?Change in detergents/soaps/personal care products: no ?Recent illness: no ?Recent travel:no ?History of same: no ?Context: worse ?Alleviating factors: nothing ?Treatments attempted:nothing ?Shortness of breath: no  ?Throat/tongue swelling: no ?Myalgias/arthralgias: yes ? ?Has been having issues with myalgias and she is not sure if it's from her crestor ? ? ?Relevant past medical, surgical, family and social history reviewed and updated as indicated. Interim medical history since our last visit reviewed. ?Allergies and medications reviewed and updated. ? ?Review of Systems  ?Constitutional: Negative.   ?Respiratory: Negative.    ?Cardiovascular: Negative.   ?Musculoskeletal:  Positive for arthralgias and myalgias. Negative for back pain, gait problem, joint swelling, neck pain and neck stiffness.  ?Skin:  Positive for rash. Negative for color change, pallor and wound.  ?Psychiatric/Behavioral: Negative.    ? ?Per HPI unless specifically indicated above ? ?   ?Objective:  ?  ?BP 126/72   Pulse 61   Temp 98.3 ?F (36.8 ?C)   Wt 205 lb 9.6 oz (93.3 kg)   LMP  (LMP Unknown)   SpO2 99%   BMI 34.21 kg/m?   ?Wt Readings from Last 3 Encounters:  ?08/19/21 205 lb 9.6 oz (93.3 kg)  ?07/05/21 206 lb 12.8 oz (93.8 kg)   ?01/19/21 214 lb 12.8 oz (97.4 kg)  ?  ?Physical Exam ?Vitals and nursing note reviewed.  ?Constitutional:   ?   General: She is not in acute distress. ?   Appearance: Normal appearance. She is not ill-appearing, toxic-appearing or diaphoretic.  ?HENT:  ?   Head: Normocephalic and atraumatic.  ?   Right Ear: External ear normal.  ?   Left Ear: External ear normal.  ?   Nose: Nose normal.  ?   Mouth/Throat:  ?   Mouth: Mucous membranes are moist.  ?   Pharynx: Oropharynx is clear.  ?Eyes:  ?   General: No scleral icterus.    ?   Right eye: No discharge.     ?   Left eye: No discharge.  ?   Extraocular Movements: Extraocular movements intact.  ?   Conjunctiva/sclera: Conjunctivae normal.  ?   Pupils: Pupils are equal, round, and reactive to light.  ?Cardiovascular:  ?   Rate and Rhythm: Normal rate and regular rhythm.  ?   Pulses: Normal pulses.  ?   Heart sounds: Normal heart sounds. No murmur heard. ?  No friction rub. No gallop.  ?Pulmonary:  ?   Effort: Pulmonary effort is normal. No respiratory distress.  ?   Breath sounds: Normal breath sounds. No stridor. No wheezing, rhonchi or rales.  ?Chest:  ?  Chest wall: No tenderness.  ?Musculoskeletal:     ?   General: Normal range of motion.  ?   Cervical back: Normal range of motion and neck supple.  ?Skin: ?   General: Skin is warm and dry.  ?   Capillary Refill: Capillary refill takes less than 2 seconds.  ?   Coloration: Skin is not jaundiced or pale.  ?   Findings: No bruising, erythema, lesion or rash.  ?Neurological:  ?   General: No focal deficit present.  ?   Mental Status: She is alert and oriented to person, place, and time. Mental status is at baseline.  ?Psychiatric:     ?   Mood and Affect: Mood normal.     ?   Behavior: Behavior normal.     ?   Thought Content: Thought content normal.     ?   Judgment: Judgment normal.  ? ? ?Results for orders placed or performed in visit on 07/05/21  ?Microscopic Examination  ? Urine  ?Result Value Ref Range  ? WBC,  UA None seen 0 - 5 /hpf  ? RBC None seen 0 - 2 /hpf  ? Epithelial Cells (non renal) 0-10 0 - 10 /hpf  ? Mucus, UA Present (A) Not Estab.  ? Bacteria, UA Few (A) None seen/Few  ?CBC with Differential/Platelet  ?Result Value Ref Range  ? WBC 9.3 3.4 - 10.8 x10E3/uL  ? RBC 4.41 3.77 - 5.28 x10E6/uL  ? Hemoglobin 14.2 11.1 - 15.9 g/dL  ? Hematocrit 42.0 34.0 - 46.6 %  ? MCV 95 79 - 97 fL  ? MCH 32.2 26.6 - 33.0 pg  ? MCHC 33.8 31.5 - 35.7 g/dL  ? RDW 11.9 11.7 - 15.4 %  ? Platelets 421 150 - 450 x10E3/uL  ? Neutrophils 62 Not Estab. %  ? Lymphs 32 Not Estab. %  ? Monocytes 5 Not Estab. %  ? Eos 0 Not Estab. %  ? Basos 1 Not Estab. %  ? Neutrophils Absolute 5.8 1.4 - 7.0 x10E3/uL  ? Lymphocytes Absolute 3.0 0.7 - 3.1 x10E3/uL  ? Monocytes Absolute 0.4 0.1 - 0.9 x10E3/uL  ? EOS (ABSOLUTE) 0.0 0.0 - 0.4 x10E3/uL  ? Basophils Absolute 0.1 0.0 - 0.2 x10E3/uL  ? Immature Granulocytes 0 Not Estab. %  ? Immature Grans (Abs) 0.0 0.0 - 0.1 x10E3/uL  ?Comprehensive metabolic panel  ?Result Value Ref Range  ? Glucose 117 (H) 70 - 99 mg/dL  ? BUN 14 6 - 24 mg/dL  ? Creatinine, Ser 0.87 0.57 - 1.00 mg/dL  ? eGFR 77 >59 mL/min/1.73  ? BUN/Creatinine Ratio 16 9 - 23  ? Sodium 143 134 - 144 mmol/L  ? Potassium 3.7 3.5 - 5.2 mmol/L  ? Chloride 102 96 - 106 mmol/L  ? CO2 26 20 - 29 mmol/L  ? Calcium 9.4 8.7 - 10.2 mg/dL  ? Total Protein 7.0 6.0 - 8.5 g/dL  ? Albumin 4.5 3.8 - 4.9 g/dL  ? Globulin, Total 2.5 1.5 - 4.5 g/dL  ? Albumin/Globulin Ratio 1.8 1.2 - 2.2  ? Bilirubin Total 0.2 0.0 - 1.2 mg/dL  ? Alkaline Phosphatase 58 44 - 121 IU/L  ? AST 21 0 - 40 IU/L  ? ALT 25 0 - 32 IU/L  ?Lipid panel  ?Result Value Ref Range  ? Cholesterol, Total 188 100 - 199 mg/dL  ? Triglycerides 113 0 - 149 mg/dL  ? HDL 61 >39 mg/dL  ? VLDL Cholesterol Cal 20 5 -  40 mg/dL  ? LDL Chol Calc (NIH) 107 (H) 0 - 99 mg/dL  ? Chol/HDL Ratio 3.1 0.0 - 4.4 ratio  ?TSH  ?Result Value Ref Range  ? TSH 1.700 0.450 - 4.500 uIU/mL  ?Urinalysis, Routine w reflex  microscopic  ?Result Value Ref Range  ? Specific Gravity, UA 1.025 1.005 - 1.030  ? pH, UA 6.5 5.0 - 7.5  ? Color, UA Yellow Yellow  ? Appearance Ur Clear Clear  ? Leukocytes,UA Negative Negative  ? Protein,UA 1+ (A) Negative/Trace  ? Glucose, UA Negative Negative  ? Ketones, UA Trace (A) Negative  ? RBC, UA Negative Negative  ? Bilirubin, UA Negative Negative  ? Urobilinogen, Ur 0.2 0.2 - 1.0 mg/dL  ? Nitrite, UA Negative Negative  ? Microscopic Examination See below:   ?HgB A1c  ?Result Value Ref Range  ? Hgb A1c MFr Bld 6.3 (H) 4.8 - 5.6 %  ? Est. average glucose Bld gHb Est-mCnc 134 mg/dL  ?Vitamin D (25 hydroxy)  ?Result Value Ref Range  ? Vit D, 25-Hydroxy 41.8 30.0 - 100.0 ng/mL  ?Microalbumin, Urine Waived  ?Result Value Ref Range  ? Microalb, Ur Waived 80 (H) 0 - 19 mg/L  ? Creatinine, Urine Waived 300 10 - 300 mg/dL  ? Microalb/Creat Ratio 30-300 (H) <30 mg/g  ? ?   ?Assessment & Plan:  ? ?Problem List Items Addressed This Visit   ?None ?Visit Diagnoses   ? ? Tinea pedis of right foot    -  Primary  ? Will treat with lotrisone. Call if not getting better. Continue to monitor.   ? Relevant Medications  ? clotrimazole-betamethasone (LOTRISONE) cream  ? Myalgia      ? Will stop crestor for 2 weeks to see if it improves. If not will go back on.  ? ?  ?  ? ?Follow up plan: ?Return if symptoms worsen or fail to improve. ? ? ? ? ? ?

## 2021-09-01 ENCOUNTER — Telehealth: Payer: Self-pay

## 2021-09-01 NOTE — Telephone Encounter (Signed)
5 boxes of Ozempic received for the patient.  ? ?Called and notified patient that her medicine was delivered and ready for pick up.  ?

## 2021-09-02 ENCOUNTER — Other Ambulatory Visit: Payer: Self-pay | Admitting: Family Medicine

## 2021-09-02 ENCOUNTER — Encounter: Payer: Self-pay | Admitting: Family Medicine

## 2021-09-02 NOTE — Telephone Encounter (Signed)
Medication picked up by the patient yesterday afternoon.  ?

## 2021-09-15 ENCOUNTER — Other Ambulatory Visit: Payer: Self-pay | Admitting: Family Medicine

## 2021-09-15 NOTE — Telephone Encounter (Signed)
Requested medication (s) are due for refill today: yes ? ?Requested medication (s) are on the active medication list: yes ? ?Last refill:  08/19/21 ? ?Future visit scheduled: yes ? ?Notes to clinic:  med not assigned to a protocol ? ? ?Requested Prescriptions  ?Pending Prescriptions Disp Refills  ? clotrimazole-betamethasone (LOTRISONE) cream [Pharmacy Med Name: CLOTRIMAZOLE-BETAMETHASONE CRM] 30 g 0  ?  Sig: Apply 1 application. topically daily.  ?  ? Off-Protocol Failed - 09/15/2021  7:05 AM  ?  ?  Failed - Medication not assigned to a protocol, review manually.  ?  ?  Passed - Valid encounter within last 12 months  ?  Recent Outpatient Visits   ? ?      ? 3 weeks ago Tinea pedis of right foot  ? [facility] Arpelar, Megan P, DO  ? 2 months ago Annual physical exam  ? [facility] Larae Grooms, NP  ? 7 months ago Chronic pain of both knees  ? [facility] Larae Grooms, NP  ? 11 months ago Hypertension associated with diabetes [facility])  ? [facility] Larae Grooms, NP  ? 1 year ago Type 2 diabetes mellitus without complication, without long-term current use of insulin (HCC)  ? [facility], Lauren A, NP  ? ?  ?  ?Future Appointments   ? ?        ? In 3 months Larae Grooms, NP [facility]  ? ?  ? ? ?  ?  ?  ? ? ? ? ?

## 2021-09-21 ENCOUNTER — Encounter: Payer: Self-pay | Admitting: Nurse Practitioner

## 2021-10-11 ENCOUNTER — Other Ambulatory Visit: Payer: Self-pay | Admitting: Nurse Practitioner

## 2021-10-11 ENCOUNTER — Other Ambulatory Visit: Payer: Self-pay | Admitting: Internal Medicine

## 2021-10-11 DIAGNOSIS — Z1231 Encounter for screening mammogram for malignant neoplasm of breast: Secondary | ICD-10-CM

## 2021-10-19 ENCOUNTER — Ambulatory Visit (INDEPENDENT_AMBULATORY_CARE_PROVIDER_SITE_OTHER): Payer: HMO | Admitting: Internal Medicine

## 2021-10-19 ENCOUNTER — Encounter: Payer: Self-pay | Admitting: Internal Medicine

## 2021-10-19 VITALS — BP 131/78 | HR 60 | Temp 98.4°F | Ht 65.0 in | Wt 202.6 lb

## 2021-10-19 DIAGNOSIS — L84 Corns and callosities: Secondary | ICD-10-CM | POA: Diagnosis not present

## 2021-10-19 MED ORDER — TRAMADOL HCL 50 MG PO TABS: 50.0000 mg | ORAL_TABLET | Freq: Every evening | ORAL | 0 refills | Status: AC | PRN

## 2021-10-19 NOTE — Progress Notes (Signed)
BP 131/78   Pulse 60   Temp 98.4 F (36.9 C) (Oral)   Ht _0  (1.651 m)   Wt 202 lb 9.6 oz (91.9 kg)   LMP  (LMP Unknown)   SpO2 99%   BMI 33.71 kg/m    Subjective:    Patient ID: Hannah Calhoun, female    DOB: 12-Jul-1960, 61 y.o.   MRN: [id number]  Chief Complaint  Patient presents with  . Toe Pain    In between Right pinky toe and 4th toe, painful since before May. Has been being treated for Athletic foot but treatments are not working. Patient does have appt with Podiatry on 6/23 but is very painful      HPI: Hannah Calhoun is a 61 y.o. female  Toe Pain  The incident occurred more than 1 week ago (has had this issue for 12 days or so was rx for atheletes foot). Incident location: between the 4th and 5th toes on the right foot. The pain is at a severity of 5/10. Pertinent negatives include no inability to bear weight, loss of motion, loss of sensation, muscle weakness, numbness or tingling. She has tried ice for the symptoms. The treatment provided mild relief.    Chief Complaint  Patient presents with  . Toe Pain    In between Right pinky toe and 4th toe, painful since before May. Has been being treated for Athletic foot but treatments are not working. Patient does have appt with Podiatry on 6/23 but is very painful      Relevant past medical, surgical, family and social history reviewed and updated as indicated. Interim medical history since our last visit reviewed. Allergies and medications reviewed and updated.  Review of Systems  Neurological:  Negative for tingling and numbness.    Per HPI unless specifically indicated above     Objective:    BP 131/78   Pulse 60   Temp 98.4 F (36.9 C) (Oral)   Ht _1  (1.651 m)   Wt 202 lb 9.6 oz (91.9 kg)   LMP  (LMP Unknown)   SpO2 99%   BMI 33.71 kg/m   Wt Readings from Last 3 Encounters:  10/19/21 202 lb 9.6 oz (91.9 kg)  08/19/21 205 lb 9.6 oz (93.3 kg)  07/05/21 206 lb 12.8 oz (93.8 kg)     Physical Exam Vitals and nursing note reviewed.  Constitutional:      General: She is not in acute distress.    Appearance: Normal appearance. She is not ill-appearing or diaphoretic.  Eyes:     Conjunctiva/sclera: Conjunctivae normal.  Musculoskeletal:        General: Tenderness present. No swelling, deformity or signs of injury.     Right lower leg: No edema.     Left lower leg: No edema.  Skin:    General: Skin is warm and dry.     Coloration: Skin is not jaundiced or pale.     Findings: No bruising, erythema or lesion.  Neurological:     Mental Status: She is alert.    Results for orders placed or performed in visit on 07/05/21  Microscopic Examination   Urine  Result Value Ref Range   WBC, UA None seen 0 - 5 /hpf   RBC None seen 0 - 2 /hpf   Epithelial Cells (non renal) 0-10 0 - 10 /hpf   Mucus, UA Present (A) Not Estab.   Bacteria, UA Few (A) None seen/Few  CBC with  Differential/Platelet  Result Value Ref Range   WBC 9.3 3.4 - 10.8 x10E3/uL   RBC 4.41 3.77 - 5.28 x10E6/uL   Hemoglobin 14.2 11.1 - 15.9 g/dL   Hematocrit 42.0 34.0 - 46.6 %   MCV 95 79 - 97 fL   MCH 32.2 26.6 - 33.0 pg   MCHC 33.8 31.5 - 35.7 g/dL   RDW 11.9 11.7 - 15.4 %   Platelets 421 150 - 450 x10E3/uL   Neutrophils 62 Not Estab. %   Lymphs 32 Not Estab. %   Monocytes 5 Not Estab. %   Eos 0 Not Estab. %   Basos 1 Not Estab. %   Neutrophils Absolute 5.8 1.4 - 7.0 x10E3/uL   Lymphocytes Absolute 3.0 0.7 - 3.1 x10E3/uL   Monocytes Absolute 0.4 0.1 - 0.9 x10E3/uL   EOS (ABSOLUTE) 0.0 0.0 - 0.4 x10E3/uL   Basophils Absolute 0.1 0.0 - 0.2 x10E3/uL   Immature Granulocytes 0 Not Estab. %   Immature Grans (Abs) 0.0 0.0 - 0.1 x10E3/uL  Comprehensive metabolic panel  Result Value Ref Range   Glucose 117 (H) 70 - 99 mg/dL   BUN 14 6 - 24 mg/dL   Creatinine, Ser 0.87 0.57 - 1.00 mg/dL   eGFR 77 >59 mL/min/1.73   BUN/Creatinine Ratio 16 9 - 23   Sodium 143 134 - 144 mmol/L   Potassium 3.7 3.5  - 5.2 mmol/L   Chloride 102 96 - 106 mmol/L   CO2 26 20 - 29 mmol/L   Calcium 9.4 8.7 - 10.2 mg/dL   Total Protein 7.0 6.0 - 8.5 g/dL   Albumin 4.5 3.8 - 4.9 g/dL   Globulin, Total 2.5 1.5 - 4.5 g/dL   Albumin/Globulin Ratio 1.8 1.2 - 2.2   Bilirubin Total 0.2 0.0 - 1.2 mg/dL   Alkaline Phosphatase 58 44 - 121 IU/L   AST 21 0 - 40 IU/L   ALT 25 0 - 32 IU/L  Lipid panel  Result Value Ref Range   Cholesterol, Total 188 100 - 199 mg/dL   Triglycerides 113 0 - 149 mg/dL   HDL 61 >39 mg/dL   VLDL Cholesterol Cal 20 5 - 40 mg/dL   LDL Chol Calc (NIH) 107 (H) 0 - 99 mg/dL   Chol/HDL Ratio 3.1 0.0 - 4.4 ratio  TSH  Result Value Ref Range   TSH 1.700 0.450 - 4.500 uIU/mL  Urinalysis, Routine w reflex microscopic  Result Value Ref Range   Specific Gravity, UA 1.025 1.005 - 1.030   pH, UA 6.5 5.0 - 7.5   Color, UA Yellow Yellow   Appearance Ur Clear Clear   Leukocytes,UA Negative Negative   Protein,UA 1+ (A) Negative/Trace   Glucose, UA Negative Negative   Ketones, UA Trace (A) Negative   RBC, UA Negative Negative   Bilirubin, UA Negative Negative   Urobilinogen, Ur 0.2 0.2 - 1.0 mg/dL   Nitrite, UA Negative Negative   Microscopic Examination See below:   HgB A1c  Result Value Ref Range   Hgb A1c MFr Bld 6.3 (H) 4.8 - 5.6 %   Est. average glucose Bld gHb Est-mCnc 134 mg/dL  Vitamin D (25 hydroxy)  Result Value Ref Range   Vit D, 25-Hydroxy 41.8 30.0 - 100.0 ng/mL  Microalbumin, Urine Waived  Result Value Ref Range   Microalb, Ur Waived 80 (H) 0 - 19 mg/L   Creatinine, Urine Waived 300 10 - 300 mg/dL   Microalb/Creat Ratio 30-300 (H) <30 mg/g  Current Outpatient Medications:  .  traMADol (ULTRAM) 50 MG tablet, Take 1 tablet (50 mg total) by mouth at bedtime as needed for up to 15 days for severe pain., Disp: 15 tablet, Rfl: 0 .  acetaminophen (TYLENOL) 500 MG tablet, Take 650 mg by mouth 2 (two) times daily., Disp: , Rfl:  .  amLODipine (NORVASC) 10 MG tablet, Take  1 tablet (10 mg total) by mouth daily., Disp: 90 tablet, Rfl: 1 .  Cholecalciferol 25 MCG (1000 UT) capsule, Take 1,000 Units by mouth daily., Disp: , Rfl:  .  fluticasone (FLONASE) 50 MCG/ACT nasal spray, PLACE 1 SPRAY INTO BOTH NOSTRILS DAILY., Disp: 48 mL, Rfl: 3 .  gabapentin (NEURONTIN) 300 MG capsule, Take 1 tablet 3 times daily, Disp: 270 capsule, Rfl: 1 .  glipiZIDE (GLUCOTROL) 5 MG tablet, TAKE 1 TABLET BY MOUTH TWICE A DAY BEFORE A MEAL-NO MORE REFILLS WITHOUT OFFICE VISIT, Disp: 180 tablet, Rfl: 1 .  hydrochlorothiazide (HYDRODIURIL) 25 MG tablet, Take 1 tablet (25 mg total) by mouth daily., Disp: 90 tablet, Rfl: 1 .  metoprolol succinate (TOPROL-XL) 25 MG 24 hr tablet, Take 1 tablet (25 mg total) by mouth daily., Disp: 90 tablet, Rfl: 1 .  Multiple Vitamin (MULTI-VITAMIN) tablet, Take 1 tablet by mouth daily., Disp: , Rfl:  .  potassium chloride SA (KLOR-CON M20) 20 MEQ tablet, Take 1 tablet (20 mEq total) by mouth 2 (two) times daily., Disp: 180 tablet, Rfl: 1 .  Semaglutide,0.25 or 0.5MG/DOS, (OZEMPIC, 0.25 OR 0.5 MG/DOSE,) 2 MG/1.5ML SOPN, Inject 0.5 mg into the skin once a week., Disp: 4.5 mL, Rfl: 0    Assessment & Plan:  Toe pain/ sec to callus :  Will need to see podiatry for such   Problem List Items Addressed This Visit       Musculoskeletal and Integument   Callus - Primary     No orders of the defined types were placed in this encounter.    Meds ordered this encounter  Medications  . traMADol (ULTRAM) 50 MG tablet    Sig: Take 1 tablet (50 mg total) by mouth at bedtime as needed for up to 15 days for severe pain.    Dispense:  15 tablet    Refill:  0     Follow up plan: No follow-ups on file.

## 2021-10-20 ENCOUNTER — Telehealth: Payer: Self-pay

## 2021-10-20 NOTE — Progress Notes (Signed)
Chronic Care Management Pharmacy Assistant   Name: Liridona Bultemeier  MRN: [id number] DOB: June 04, 1960   Reason for Encounter: Disease State-General    Recent office visits:  10/19/21 Charlynne Cousins, MD-Internal Medicine (Callus) Orders:none, Medication changes: traMADol (ULTRAM) 50 MG tablet  08/19/21 Valerie Roys, DO-Family Medicine (Tinea pedis of right foot) Orders: None, Medication changes: clotrimazole-betamethasone (LOTRISONE) cream  07/05/21 Jon Billings, NP-PCP (Annual exam) Labs ordered today; Medication changes: none Recent consult visits:  None since last coordination call  Hospital visits:  None in previous 6 months  Medications: Outpatient Encounter Medications as of 10/20/2021  Medication Sig   acetaminophen (TYLENOL) 500 MG tablet Take 650 mg by mouth 2 (two) times daily.   amLODipine (NORVASC) 10 MG tablet Take 1 tablet (10 mg total) by mouth daily.   Cholecalciferol 25 MCG (1000 UT) capsule Take 1,000 Units by mouth daily.   fluticasone (FLONASE) 50 MCG/ACT nasal spray PLACE 1 SPRAY INTO BOTH NOSTRILS DAILY.   gabapentin (NEURONTIN) 300 MG capsule Take 1 tablet 3 times daily   glipiZIDE (GLUCOTROL) 5 MG tablet TAKE 1 TABLET BY MOUTH TWICE A DAY BEFORE A MEAL-NO MORE REFILLS WITHOUT OFFICE VISIT   hydrochlorothiazide (HYDRODIURIL) 25 MG tablet Take 1 tablet (25 mg total) by mouth daily.   metoprolol succinate (TOPROL-XL) 25 MG 24 hr tablet Take 1 tablet (25 mg total) by mouth daily.   Multiple Vitamin (MULTI-VITAMIN) tablet Take 1 tablet by mouth daily.   potassium chloride SA (KLOR-CON M20) 20 MEQ tablet Take 1 tablet (20 mEq total) by mouth 2 (two) times daily.   Semaglutide,0.25 or 0.5MG /DOS, (OZEMPIC, 0.25 OR 0.5 MG/DOSE,) 2 MG/1.5ML SOPN Inject 0.5 mg into the skin once a week.   traMADol (ULTRAM) 50 MG tablet Take 1 tablet (50 mg total) by mouth at bedtime as needed for up to 15 days for severe pain.   No facility-administered encounter  medications on file as of 10/20/2021.   Long for General Review Call   Chart Review:  Have there been any documented new, changed, or discontinued medications since last visit? Yes (If yes, include name, dose, frequency, date) Has there been any documented recent hospitalizations or ED visits since last visit with Clinical Pharmacist? No Brief Summary (including medication and/or Diagnosis changes):   Adherence Review:  Does the Clinical Pharmacist Assistant have access to adherence rates? Yes Adherence rates for STAR metric medications (List medication(s)/day supply/ last 2 fill dates). Adherence rates for medications indicated for disease state being reviewed (List medication(s)/day supply/ last 2 fill dates). Does the patient have >5 day gap between last estimated fill dates for any of the above medications or other medication gaps? No Reason for medication gaps.   Disease State Questions:  Able to connect with Patient? Yes  Did patient have any problems with their health recently? No Note problems and Concerns:  Have you had any admissions or emergency room visits or worsening of your condition(s) since last visit? No Details of ED visit, hospital visit and/or worsening condition(s):  Have you had any visits with new specialists or providers since your last visit? No Explain:  Have you had any new health care problem(s) since your last visit? No New problem(s) reported:  Have you run out of any of your medications since you last spoke with clinical pharmacist? No What caused you to run out of your medications?  Are there any medications you are not taking as prescribed? Yes, patient states that she no  longer takes atorvastatin because it caused her joint pain. What kept you from taking your medications as prescribed?  Are you having any issues or side effects with your medications? No Note of issues or side effects:  Do you have any other  health concerns or questions you want to discuss with your Clinical Pharmacist before your next visit? No Note additional concerns and questions from Patient.  Are there any health concerns that you feel we can do a better job addressing? No Note Patient's response.  Are you having any problems with any of the following since the last visit: (select all that apply)  None  Details:  12. Any falls since last visit? No  Details:  13. Any increased or uncontrolled pain since last visit? No  Details:  14. Next visit Type: office       Visit with:Karen Mathis Dad, NP        Date:12/31/21        Time:3pm  15. Additional Details? No    Care Gaps: Colonoscopy-05/09/12 Diabetic Foot Exam-07/05/21 Mammogram-NA Ophthalmology-12/25/19 Dexa Scan - NA Annual Well Visit - 07/05/21 Micro albumin-07/05/21 Hemoglobin A1c- 07/05/21  Star Rating Drugs: Glipizide 5 mg-last fill 07/05/21  Semaglutide 0.25 or 0.5 mg/dose-last fill 07/05/21  Ethelene Hal Clinical Pharmacist Assistant [phone]

## 2021-11-07 ENCOUNTER — Other Ambulatory Visit: Payer: Self-pay | Admitting: Nurse Practitioner

## 2021-11-08 NOTE — Telephone Encounter (Signed)
Refilled 07/05/2021#270 1 refill. Requested Prescriptions  Pending Prescriptions Disp Refills  . gabapentin (NEURONTIN) 300 MG capsule [Pharmacy Med Name: GABAPENTIN 300 MG CAPSULE] 270 capsule 1    Sig: TAKE 1 CAPSULE BY MOUTH THREE TIMES A DAY     Neurology: Anticonvulsants - gabapentin Passed - 11/07/2021  8:00 AM      Passed - Cr in normal range and within 360 days    Creatinine  Date Value Ref Range Status  01/22/2013 0.86 0.60 - 1.30 mg/dL Final   Creatinine, Ser  Date Value Ref Range Status  07/05/2021 0.87 0.57 - 1.00 mg/dL Final   Creatinine,U  Date Value Ref Range Status  09/10/2014 65.8 mg/dL Final         Passed - Completed PHQ-2 or PHQ-9 in the last 360 days      Passed - Valid encounter within last 12 months    Recent Outpatient Visits          2 weeks ago Callus   [facility] Vigg, Avanti, MD   2 months ago Tinea pedis of right foot   [facility] Solomon, Megan P, DO   4 months ago Annual physical exam   [facility] Larae Grooms, NP   9 months ago Chronic pain of both knees   [facility], Clydie Braun, NP   1 year ago Hypertension associated with diabetes [facility])   [facility] Larae Grooms, NP      Future Appointments            In 1 month Larae Grooms, NP [facility]

## 2021-11-15 DIAGNOSIS — M898X9 Other specified disorders of bone, unspecified site: Secondary | ICD-10-CM | POA: Diagnosis not present

## 2021-11-15 DIAGNOSIS — E119 Type 2 diabetes mellitus without complications: Secondary | ICD-10-CM | POA: Diagnosis not present

## 2021-11-15 DIAGNOSIS — M2042 Other hammer toe(s) (acquired), left foot: Secondary | ICD-10-CM | POA: Diagnosis not present

## 2021-11-15 DIAGNOSIS — M2041 Other hammer toe(s) (acquired), right foot: Secondary | ICD-10-CM | POA: Diagnosis not present

## 2021-11-18 ENCOUNTER — Ambulatory Visit
Admission: RE | Admit: 2021-11-18 | Discharge: 2021-11-18 | Disposition: A | Discharge status: Home / Self Care | Payer: HMO | Source: Ambulatory Visit | Attending: Nurse Practitioner | Admitting: Nurse Practitioner

## 2021-11-18 DIAGNOSIS — Z1231 Encounter for screening mammogram for malignant neoplasm of breast: Secondary | ICD-10-CM | POA: Diagnosis not present

## 2021-11-19 NOTE — Progress Notes (Signed)
Please let patient know her Mammogram did not show any evidence of a malignancy.  The recommendation is to repeat the Mammogram in 1 year.  

## 2021-12-10 ENCOUNTER — Emergency Department
Admission: EM | Admit: 2021-12-10 | Discharge: 2021-12-10 | Disposition: A | Discharge status: Home / Self Care | Payer: HMO | Attending: Emergency Medicine | Admitting: Emergency Medicine

## 2021-12-10 ENCOUNTER — Other Ambulatory Visit: Payer: Self-pay

## 2021-12-10 ENCOUNTER — Encounter: Payer: Self-pay | Admitting: Nurse Practitioner

## 2021-12-10 ENCOUNTER — Encounter: Payer: Self-pay | Admitting: Emergency Medicine

## 2021-12-10 ENCOUNTER — Ambulatory Visit: Payer: Self-pay | Admitting: *Deleted

## 2021-12-10 DIAGNOSIS — I10 Essential (primary) hypertension: Secondary | ICD-10-CM | POA: Diagnosis not present

## 2021-12-10 DIAGNOSIS — E119 Type 2 diabetes mellitus without complications: Secondary | ICD-10-CM | POA: Diagnosis not present

## 2021-12-10 DIAGNOSIS — K2970 Gastritis, unspecified, without bleeding: Secondary | ICD-10-CM | POA: Diagnosis not present

## 2021-12-10 DIAGNOSIS — K625 Hemorrhage of anus and rectum: Secondary | ICD-10-CM | POA: Diagnosis present

## 2021-12-10 DIAGNOSIS — K644 Residual hemorrhoidal skin tags: Secondary | ICD-10-CM | POA: Insufficient documentation

## 2021-12-10 LAB — CBC
HCT: 40.8 % (ref 36.0–46.0)
Hemoglobin: 13.7 g/dL (ref 12.0–15.0)
MCH: 31.4 pg (ref 26.0–34.0)
MCHC: 33.6 g/dL (ref 30.0–36.0)
MCV: 93.6 fL (ref 80.0–100.0)
Platelets: 420 10*3/uL — ABNORMAL HIGH (ref 150–400)
RBC: 4.36 MIL/uL (ref 3.87–5.11)
RDW: 12.9 % (ref 11.5–15.5)
WBC: 10.8 10*3/uL — ABNORMAL HIGH (ref 4.0–10.5)
nRBC: 0 % (ref 0.0–0.2)

## 2021-12-10 LAB — COMPREHENSIVE METABOLIC PANEL
ALT: 21 U/L (ref 0–44)
AST: 20 U/L (ref 15–41)
Albumin: 4.4 g/dL (ref 3.5–5.0)
Alkaline Phosphatase: 43 U/L (ref 38–126)
Anion gap: 9 (ref 5–15)
BUN: 13 mg/dL (ref 6–20)
CO2: 28 mmol/L (ref 22–32)
Calcium: 9.1 mg/dL (ref 8.9–10.3)
Chloride: 103 mmol/L (ref 98–111)
Creatinine, Ser: 0.91 mg/dL (ref 0.44–1.00)
GFR, Estimated: >60 mL/min (ref >60)
Glucose, Bld: 93 mg/dL (ref 70–99)
Potassium: 3.2 mmol/L — ABNORMAL LOW (ref 3.5–5.1)
Sodium: 140 mmol/L (ref 135–145)
Total Bilirubin: 0.6 mg/dL (ref 0.3–1.2)
Total Protein: 7.5 g/dL (ref 6.5–8.1)

## 2021-12-10 LAB — TYPE AND SCREEN
ABO/RH(D): O POS
Antibody Screen: NEGATIVE

## 2021-12-10 LAB — LIPASE, BLOOD: Lipase: 111 U/L — ABNORMAL HIGH (ref 11–51)

## 2021-12-10 MED ORDER — HYDROCORTISONE ACETATE 25 MG RE SUPP: 25.0000 mg | Freq: Two times a day (BID) | RECTAL | 1 refills | Status: AC

## 2021-12-10 MED ORDER — FAMOTIDINE 20 MG PO TABS: 20.0000 mg | ORAL_TABLET | Freq: Two times a day (BID) | ORAL | 0 refills | Status: DC

## 2021-12-10 MED ORDER — ALUMINUM-MAGNESIUM-SIMETHICONE 200-200-20 MG/5ML PO SUSP: 30.0000 mL | Freq: Three times a day (TID) | ORAL | 0 refills | Status: DC

## 2021-12-10 MED ORDER — WITCH HAZEL-GLYCERIN EX PADS: 1.0000 | MEDICATED_PAD | CUTANEOUS | 12 refills | Status: DC | PRN

## 2021-12-10 NOTE — Telephone Encounter (Signed)
Reason for Disposition  [1] MODERATE rectal bleeding (small blood clots, passing blood without stool, or toilet water turns red) AND [2] more than once a day  Answer Assessment - Initial Assessment Questions 1. APPEARANCE of BLOOD: "What color is it?" "Is it passed separately, on the surface of the stool, or mixed in with the stool?"      Rectal bleeding this week.  4 times  When I go to the bathroom.   I have pain too.   After I eat I have to have a bloody stool. 2. AMOUNT: "How much blood was passed?"      I can't see the bottom of the toilet due to the blood.   I having nausea. 3. FREQUENCY: "How many times has blood been passed with the stools?"      Not dizzy.   4 BMs Abd pain before going to the bathroom. 4. ONSET: "When was the blood first seen in the stools?" (Days or weeks)      Monday 1st seen 5. DIARRHEA: "Is there also some diarrhea?" If Yes, ask: "How many diarrhea stools in the past 24 hours?"      No diarrhea but when I eat I have to go. 6. CONSTIPATION: "Do you have constipation?" If Yes, ask: "How bad is it?"     No 7. RECURRENT SYMPTOMS: "Have you had blood in your stools before?" If Yes, ask: "When was the last time?" and "What happened that time?"      Yes a long time ago I had a stomach ulcer early 2000s. 8. BLOOD THINNERS: "Do you take any blood thinners?" (e.g., Coumadin/warfarin, Pradaxa/dabigatran, aspirin)     No 9. OTHER SYMPTOMS: "Do you have any other symptoms?"  (e.g., abdomen pain, vomiting, dizziness, fever)     No dizziness 10. PREGNANCY: "Is there any chance you are pregnant?" "When was your last menstrual period?"       N/A due to the blood  Protocols used: Rectal Bleeding-A-AH

## 2021-12-10 NOTE — Telephone Encounter (Signed)
FYI to provider

## 2021-12-10 NOTE — ED Triage Notes (Signed)
Patient reports rectal bleeding since Monday. No episodes on Tuesday but continued bleeding Wednesday and until today. Reports pain with bowel movements. Denies any blood thinners. Reports hx of internal hemorrhoids.

## 2021-12-10 NOTE — ED Provider Notes (Signed)
[facility] Provider Note    Event Date/Time   First MD Initiated Contact with Patient 12/10/21 1748     (approximate)   History   Chief Complaint: Rectal Bleeding   HPI  Hannah Calhoun is a 61 y.o. female with a past medical history of GERD, hypertension, diabetes who comes ED complaining of intermittent rectal bleeding with bowel movements for the past 5 days.  Also has rectal pain with bowel movements.  Not on blood thinners.  No black stool.  No vomiting or fever.  She does have a history of hemorrhoids     Physical Exam   Triage Vital Signs: ED Triage Vitals  Enc Vitals Group     BP 12/10/21 1549 (!) 149/83     Pulse Rate 12/10/21 1549 68     Resp 12/10/21 1549 16     Temp 12/10/21 1549 98 F (36.7 C)     Temp src --      SpO2 12/10/21 1549 94 %     Weight 12/10/21 1552 197 lb (89.4 kg)     Height 12/10/21 1552 5\' 6"  (1.676 m)     Head Circumference --      Peak Flow --      Pain Score 12/10/21 1548 0     Pain Loc --      Pain Edu? --      Excl. in GC? --     Most recent vital signs: Vitals:   12/10/21 1549  BP: (!) 149/83  Pulse: 68  Resp: 16  Temp: 98 F (36.7 C)  SpO2: 94%    General: Awake, no distress.  CV:  Good peripheral perfusion.  Resp:  Normal effort.  Abd:  No distention.  Mild left upper quadrant tenderness.  Rectal exam performed with paramedic Brandi at bedside, shows prolapsed external hemorrhoids, no bleeding.  Secretions only in the vault, Hemoccult negative. Other:  Moist mucosa   ED Results / Procedures / Treatments   Labs (all labs ordered are listed, but only abnormal results are displayed) Labs Reviewed  COMPREHENSIVE METABOLIC PANEL - Abnormal; Notable for the following components:      Result Value   Potassium 3.2 (*)    All other components within normal limits  CBC - Abnormal; Notable for the following components:   WBC 10.8 (*)    Platelets 420 (*)    All other components within  normal limits  LIPASE, BLOOD - Abnormal; Notable for the following components:   Lipase 111 (*)    All other components within normal limits  POC OCCULT BLOOD, ED  TYPE AND SCREEN     EKG    RADIOLOGY    PROCEDURES:  Procedures   MEDICATIONS ORDERED IN ED: Medications - No data to display   IMPRESSION / MDM / ASSESSMENT AND PLAN / ED COURSE  I reviewed the triage vital signs and the nursing notes.                              Differential diagnosis includes, but is not limited to, bleeding hemorrhoids, GI bleed, anemia, uremia, thrombocytopenia, pancreatitis, gastritis  Patient's presentation is most consistent with acute presentation with potential threat to life or bodily function.  Patient presents with rectal bleeding as well as reporting some pain with eating.  Rectal bleeding appears to be due to hemorrhoids which are engorged and prolapse.  No current bleeding.  Hemoglobin  is normal, vital signs are normal.  Upper abdominal tenderness and discomfort with eating appears to be due to gastritis.  Labs are reassuring, lipase is at chronic baseline.  Will treat with Pepcid and Maalox.       FINAL CLINICAL IMPRESSION(S) / ED DIAGNOSES   Final diagnoses:  External hemorrhoids  Gastritis without bleeding, unspecified chronicity, unspecified gastritis type     Rx / DC Orders   ED Discharge Orders          Ordered    hydrocortisone (ANUSOL-HC) 25 MG suppository  Every 12 hours        12/10/21 1822    witch hazel-glycerin (TUCKS) pad  As needed        12/10/21 1822    famotidine (PEPCID) 20 MG tablet  2 times daily        12/10/21 1822    aluminum-magnesium hydroxide-simethicone (MAALOX) 200-200-20 MG/5ML SUSP  3 times daily before meals & bedtime        12/10/21 1822             Note:  This document was prepared using Dragon voice recognition software and may include unintentional dictation errors.   Sharman Cheek, MD 12/10/21 [phone]

## 2021-12-10 NOTE — Telephone Encounter (Signed)
  Chief Complaint: rectal bleeding  Symptoms: bleeding into toilet enough it's bright red and can't see the bottom of the toilet Frequency: Happened 4 times since Monday.  Pertinent Negatives: Patient denies being dizzy or weak Disposition: [x] ED /[] Urgent Care (no appt availability in office) / [] Appointment(In office/virtual)/ []  [facility]/ [] Home Care/ [] Refused Recommended Disposition /[] [facility]/ []  Follow-up with PCP Additional Notes: Pt agreeable to going to the ED per protocol.   Sent my notes to , NP at [facility] for her information.

## 2021-12-10 NOTE — ED Provider Triage Note (Signed)
Emergency Medicine Provider Triage Evaluation Note  Laniya Friedl , a 61 y.o. female  was evaluated in triage.  Pt complains of rectal bleeding.  Stools have been maroon-colored.  Has a history of internal hemorrhoids.  Last colonoscopy was 4 years ago but is due for a another one as she has to have them every 5 years..  Review of Systems  Positive: Rectal bleeding Negative: Fever  Physical Exam  LMP  (LMP Unknown)  Gen:   Awake, no distress   Resp:  Normal effort  MSK:   Moves extremities without difficulty  Other:    Medical Decision Making  Medically screening exam initiated at 3:48 PM.  Appropriate orders placed.  Shayanna Sincere Berlanga was informed that the remainder of the evaluation will be completed by another provider, this initial triage assessment does not replace that evaluation, and the importance of remaining in the ED until their evaluation is complete.     Faythe Ghee, PA-C 12/10/21 1550

## 2021-12-13 ENCOUNTER — Encounter: Payer: Self-pay | Admitting: Nurse Practitioner

## 2021-12-13 ENCOUNTER — Telehealth: Payer: Self-pay | Admitting: *Deleted

## 2021-12-13 NOTE — Telephone Encounter (Signed)
Transition Care Management Follow-up Telephone Call Date of discharge and from where: [facility] How have you been since you were released from the hospital? Feeling great Any questions or concerns? No  Items Reviewed: Did the pt receive and understand the discharge instructions provided? Yes  Medications obtained and verified?  Other? No  Any new allergies since your discharge? No  Dietary orders reviewed? No Do you have support at home? Yes   Home Care and Equipment/Supplies: Were home health services ordered?  If so, what is the name of the agency?   Has the agency set up a time to come to the patient's home?  Were any new equipment or medical supplies ordered?   What is the name of the medical supply agency?  Were you able to get the supplies/equipment?  Do you have any questions related to the use of the equipment or supplies?   Functional Questionnaire: (I = Independent and D = Dependent) ADLs: I  Bathing/Dressing- I  Meal Prep- I  Eating- I  Maintaining continence- I  Transferring/Ambulation- I  Managing Meds- I  Follow up appointments reviewed:  PCP Hospital f/u appt confirmed? Patient will call if she feels she needs a follow up  Specialist Hospital f/u appt confirmed?  Will follow up if needed   Are transportation arrangements needed? No  If their condition worsens, is the pt aware to call PCP or go to the Emergency Dept.? Yes Was the patient provided with contact information for the PCP's office or ED? Yes Was to pt encouraged to call back with questions or concerns? Yes

## 2021-12-23 ENCOUNTER — Other Ambulatory Visit: Payer: Self-pay | Admitting: Nurse Practitioner

## 2021-12-24 NOTE — Telephone Encounter (Signed)
Requesting too early, appt next week. Requested Prescriptions  Pending Prescriptions Disp Refills  . gabapentin (NEURONTIN) 300 MG capsule [Pharmacy Med Name: GABAPENTIN 300 MG CAPSULE] 270 capsule 1    Sig: TAKE 1 CAPSULE BY MOUTH THREE TIMES A DAY     Neurology: Anticonvulsants - gabapentin Passed - 12/23/2021 10:31 AM      Passed - Cr in normal range and within 360 days    Creatinine  Date Value Ref Range Status  01/22/2013 0.86 0.60 - 1.30 mg/dL Final   Creatinine, Ser  Date Value Ref Range Status  12/10/2021 0.91 0.44 - 1.00 mg/dL Final   Creatinine,U  Date Value Ref Range Status  09/10/2014 65.8 mg/dL Final         Passed - Completed PHQ-2 or PHQ-9 in the last 360 days      Passed - Valid encounter within last 12 months    Recent Outpatient Visits          2 months ago Callus   [facility] Vigg, Avanti, MD   4 months ago Tinea pedis of right foot   [facility] [place], Megan P, DO   5 months ago Annual physical exam   [facility] Larae Grooms, NP   11 months ago Chronic pain of both knees   [facility] [place], Clydie Braun, NP   1 year ago Hypertension associated with diabetes [facility])   [facility] Larae Grooms, NP      Future Appointments            In 1 week Larae Grooms, NP [facility], [facility]           Refused Prescriptions Disp Refills  . glipiZIDE (GLUCOTROL) 5 MG tablet [Pharmacy Med Name: GLIPIZIDE 5 MG TABLET] 180 tablet 1    Sig: TAKE 1 TABLET BY MOUTH TWICE A DAY BEFORE A MEAL-NO MORE REFILLS WITHOUT OFFICE VISIT     Endocrinology:  Diabetes - Sulfonylureas Passed - 12/23/2021 10:31 AM      Passed - HBA1C is between 0 and 7.9 and within 180 days    HB A1C (BAYER DCA - WAIVED)  Date Value Ref Range Status  07/03/2020 7.5 (H) <7.0 % Final    Comment:                                          Diabetic Adult            <7.0                                        Healthy Adult        4.3 - 5.7                                                           (DCCT/NGSP) American Diabetes Association's Summary of Glycemic Recommendations for Adults with Diabetes: Hemoglobin A1c <7.0%. More stringent glycemic goals (A1c <6.0%) may further reduce complications at the cost of increased risk of hypoglycemia.    Hgb A1c MFr Bld  Date Value Ref Range Status  07/05/2021 6.3 (  H) 4.8 - 5.6 % Final    Comment:             Prediabetes: 5.7 - 6.4          Diabetes: >6.4          Glycemic control for adults with diabetes: <7.0          Passed - Cr in normal range and within 360 days    Creatinine  Date Value Ref Range Status  01/22/2013 0.86 0.60 - 1.30 mg/dL Final   Creatinine, Ser  Date Value Ref Range Status  12/10/2021 0.91 0.44 - 1.00 mg/dL Final   Creatinine,U  Date Value Ref Range Status  09/10/2014 65.8 mg/dL Final         Passed - Valid encounter within last 6 months    Recent Outpatient Visits          2 months ago Callus   [facility] Vigg, Avanti, MD   4 months ago Tinea pedis of right foot   [facility] [place], Megan P, DO   5 months ago Annual physical exam   [facility] Larae Grooms, NP   11 months ago Chronic pain of both knees   [facility] [place], Clydie Braun, NP   1 year ago Hypertension associated with diabetes (HCC)   [facility] Larae Grooms, NP      Future Appointments            In 1 week Larae Grooms, NP [facility]           . hydrochlorothiazide (HYDRODIURIL) 25 MG tablet [Pharmacy Med Name: HYDROCHLOROTHIAZIDE 25 MG TAB] 90 tablet 1    Sig: TAKE 1 TABLET (25 MG TOTAL) BY MOUTH DAILY.     Cardiovascular: Diuretics - Thiazide Failed - 12/23/2021 10:31 AM      Failed - K in normal range and within 180 days    Potassium  Date Value Ref Range Status  12/10/2021 3.2 (L) 3.5 - 5.1 mmol/L Final  01/22/2013 3.1 (L) 3.5 -  5.1 mmol/L Final         Passed - Cr in normal range and within 180 days    Creatinine  Date Value Ref Range Status  01/22/2013 0.86 0.60 - 1.30 mg/dL Final   Creatinine, Ser  Date Value Ref Range Status  12/10/2021 0.91 0.44 - 1.00 mg/dL Final   Creatinine,U  Date Value Ref Range Status  09/10/2014 65.8 mg/dL Final         Passed - Na in normal range and within 180 days    Sodium  Date Value Ref Range Status  12/10/2021 140 135 - 145 mmol/L Final  07/05/2021 143 134 - 144 mmol/L Final  01/22/2013 134 (L) 136 - 145 mmol/L Final         Passed - Last BP in normal range    BP Readings from Last 1 Encounters:  12/10/21 125/74         Passed - Valid encounter within last 6 months    Recent Outpatient Visits          2 months ago Callus   [facility] Vigg, Avanti, MD   4 months ago Tinea pedis of right foot   [facility] [place], [place], DO   5 months ago Annual physical exam   [facility] Larae Grooms, NP   11 months ago Chronic pain of both knees   [facility] Jon Billings, NP   1 year ago Hypertension associated with diabetes [facility]   [facility] Jon Billings, NP      Future Appointments            In 1 week Jon Billings, NP [facility]           . amLODipine ([place]) 10 MG tablet [Pharmacy Med Name: AMLODIPINE BESYLATE 10 MG TAB] 90 tablet 1    Sig: TAKE 1 TABLET BY MOUTH EVERY DAY     Cardiovascular: Calcium Channel Blockers 2 Passed - 12/23/2021 10:31 AM      Passed - Last BP in normal range    BP Readings from Last 1 Encounters:  12/10/21 125/74         Passed - Last Heart Rate in normal range    Pulse Readings from Last 1 Encounters:  12/10/21 61         Passed - Valid encounter within last 6 months    Recent Outpatient Visits          2 months ago [place] Vigg, Avanti, MD   4 months ago Tinea pedis of right foot    [place], [place], DO   5 months ago Annual physical exam   [facility] Jon Billings, NP   11 months ago Chronic pain of both knees   [facility] [place], Santiago Glad, NP   1 year ago Hypertension associated with diabetes [facility])   [place], Karen, NP      Future Appointments            In 1 week Jon Billings, NP [facility] [place]           . KLOR-CON M20 20 MEQ tablet [Pharmacy Med Name: KLOR-CON M20 TABLET] 180 tablet 1    Sig: TAKE 1 TABLET BY MOUTH TWICE A DAY     Endocrinology:  Minerals - Potassium Supplementation Failed - 12/23/2021 10:31 AM      Failed - K in normal range and within 360 days    Potassium  Date Value Ref Range Status  12/10/2021 3.2 (L) 3.5 - 5.1 mmol/L Final  01/22/2013 3.1 (L) 3.5 - 5.1 mmol/L Final         Passed - Cr in normal range and within 360 days    Creatinine  Date Value Ref Range Status  01/22/2013 0.86 0.60 - 1.30 mg/dL Final   Creatinine, Ser  Date Value Ref Range Status  12/10/2021 0.91 0.44 - 1.00 mg/dL Final   Creatinine,U  Date Value Ref Range Status  09/10/2014 65.8 mg/dL Final         Passed - Valid encounter within last 12 months    Recent Outpatient Visits          2 months ago [place], MD   4 months ago Tinea pedis of right foot   [place], [place], DO   5 months ago Annual physical exam   [place], NP   11 months ago Chronic pain of both knees   [facility] [place], Santiago Glad, NP   1 year ago Hypertension associated with diabetes [facility])   [place], Karen, NP      Future Appointments            In 1 week Jon Billings, NP [facility]

## 2021-12-30 ENCOUNTER — Encounter: Payer: Self-pay | Admitting: Gastroenterology

## 2021-12-30 ENCOUNTER — Other Ambulatory Visit: Payer: Self-pay

## 2021-12-30 ENCOUNTER — Ambulatory Visit (INDEPENDENT_AMBULATORY_CARE_PROVIDER_SITE_OTHER): Payer: HMO | Admitting: Gastroenterology

## 2021-12-30 VITALS — BP 138/78 | HR 60 | Temp 98.8°F | Ht 66.0 in | Wt 199.4 lb

## 2021-12-30 DIAGNOSIS — K625 Hemorrhage of anus and rectum: Secondary | ICD-10-CM | POA: Diagnosis not present

## 2021-12-30 MED ORDER — NA SULFATE-K SULFATE-MG SULF 17.5-3.13-1.6 GM/177ML PO SOLN: 1.0000 | Freq: Once | ORAL | 0 refills | Status: AC

## 2021-12-30 NOTE — Progress Notes (Signed)
Wyline Mood MD, MRCP(U.K) [address]  [address]  [place], [place] [address]  Main: [phone]  Fax: [phone]   Gastroenterology Consultation  Referring Provider:     Larae Grooms, NP Primary Care Physician:  Larae Grooms, NP Primary Gastroenterologist:  Dr. Wyline Mood  Reason for Consultation:   Hemorrhoids  HPI:   Hannah Calhoun is a 61 y.o. y/o female referred for consultation & management  by  Larae Grooms, NP.     He has been referred to see me for hemorrhoids.  Presented to the emergency room on 12/10/2021 with rectal bleeding at that time rectal pain was prescribed hydrocortisone ER note mentions that the hemorrhoids were engorged and there was some degree of prolapse. 12/24/2018: Colonoscopy showed 2 polyps 2 to 3 mm in size in the sigmoid and transverse colon.  Nonbleeding internal hemorrhoids were noted.  Presently states she has no bleeding.  The bleeding lasted for a few days and stopped.  No change in bowel habits.  No change in the shape of her stool.  No unintentional weight loss.  No family history of colon cancer.   Past Medical History:  Diagnosis Date   Allergy    Arthritis    Chicken pox    Depression    Diabetes mellitus without complication (HCC)    Diverticulitis    GERD (gastroesophageal reflux disease)    History of colon polyps    Hyperlipidemia    Hypertension     Past Surgical History:  Procedure Laterality Date   BREAST BIOPSY Right 1990   EXCISIONAL - NEG   BREAST SURGERY Right 1990   CHOLECYSTECTOMY     KNEE ARTHROSCOPY  2010   NASAL SEPTUM SURGERY  2010   TONSILLECTOMY     TOTAL ABDOMINAL HYSTERECTOMY  2009   TOTAL HIP ARTHROPLASTY      Prior to Admission medications   Medication Sig Start Date End Date Taking? Authorizing Provider  acetaminophen (TYLENOL) 500 MG tablet Take 650 mg by mouth 2 (two) times daily.    [provider]  aluminum-magnesium hydroxide-simethicone (MAALOX)  200-200-20 MG/5ML SUSP Take 30 mLs by mouth 4 (four) times daily -  before meals and at bedtime. 12/10/21   Sharman Cheek, MD  amLODipine (NORVASC) 10 MG tablet Take 1 tablet (10 mg total) by mouth daily. 07/05/21   Larae Grooms, NP  Cholecalciferol 25 MCG (1000 UT) capsule Take 1,000 Units by mouth daily. 05/03/18   [provider]  famotidine (PEPCID) 20 MG tablet Take 1 tablet (20 mg total) by mouth 2 (two) times daily. 12/10/21   Sharman Cheek, MD  fluticasone [facility]) 50 MCG/ACT nasal spray PLACE 1 SPRAY INTO BOTH NOSTRILS DAILY. 10/15/20   Larae Grooms, NP  gabapentin (NEURONTIN) 300 MG capsule TAKE 1 CAPSULE BY MOUTH THREE TIMES A DAY 12/24/21   Larae Grooms, NP  glipiZIDE (GLUCOTROL) 5 MG tablet TAKE 1 TABLET BY MOUTH TWICE A DAY BEFORE A MEAL-NO MORE REFILLS WITHOUT OFFICE VISIT 07/05/21   Larae Grooms, NP  hydrochlorothiazide (HYDRODIURIL) 25 MG tablet Take 1 tablet (25 mg total) by mouth daily. 07/05/21   Larae Grooms, NP  metoprolol succinate (TOPROL-XL) 25 MG 24 hr tablet Take 1 tablet (25 mg total) by mouth daily. 07/05/21   Larae Grooms, NP  Multiple Vitamin (MULTI-VITAMIN) tablet Take 1 tablet by mouth daily.    [provider]  potassium chloride SA (KLOR-CON M20) 20 MEQ tablet Take 1 tablet (20 mEq total) by mouth 2 (  two) times daily. 07/05/21   Jon Billings, NP  Semaglutide,0.25 or 0.5MG /DOS, (OZEMPIC, 0.25 OR 0.5 MG/DOSE,) 2 MG/1.5ML SOPN Inject 0.5 mg into the skin once a week. 07/05/21   Jon Billings, NP  witch hazel-glycerin (TUCKS) pad Apply 1 Application topically as needed for itching. 12/10/21   Carrie Mew, MD    Family History  Problem Relation Age of Onset   Alcohol abuse Mother    Hyperlipidemia Mother    Heart disease Mother    Hypertension Mother    Heart attack Mother    Alcohol abuse Father    Hyperlipidemia Father    Heart disease Father    Hypertension Father    Heart attack Father    Alcohol  abuse Maternal Aunt    Hypertension Maternal Aunt    Kidney disease Maternal Aunt    Alcohol abuse Maternal Uncle    Hypertension Maternal Uncle    Alcohol abuse Paternal Aunt    Birth defects Paternal Aunt        Colon, Breast, Lung   Heart disease Paternal Aunt    Breast cancer Paternal Aunt    Alcohol abuse Paternal Uncle    Heart disease Paternal Uncle    Stroke Maternal Grandmother    Hypertension Maternal Grandmother    Alcohol abuse Maternal Grandfather    Heart disease Maternal Grandfather    Hypertension Maternal Grandfather      Social History   Tobacco Use   Smoking status: Former    Packs/day: 0.50    Years: 15.00    Total pack years: 7.50    Types: Cigarettes    Quit date: 06/12/2005    Years since quitting: 16.5   Smokeless tobacco: Never   Tobacco comments:    quit 2007  Vaping Use   Vaping Use: Never used  Substance Use Topics   Alcohol use: Not Currently    Alcohol/week: 0.0 standard drinks of alcohol   Drug use: No    Allergies as of 12/30/2021 - Review Complete 12/10/2021  Allergen Reaction Noted   Crestor [rosuvastatin] Other (See Comments) 09/02/2021   Doxycycline Diarrhea and Nausea And Vomiting 03/11/2014   Nsaids Other (See Comments) 03/11/2014   Other Other (See Comments) 09/26/2019   Tolmetin  09/08/2015   Ace inhibitors Other (See Comments) and Rash 03/08/2016   Aspirin Rash 02/23/2017    Review of Systems:    All systems reviewed and negative except where noted in HPI.   Physical Exam:  LMP  (LMP Unknown)  No LMP recorded (lmp unknown). Patient has had a hysterectomy. Psych:  Alert and cooperative. Normal mood and affect. General:   Alert,  Well-developed, well-nourished, pleasant and cooperative in NAD Head:  Normocephalic and atraumatic. Neurologic:  Alert and oriented x3;  grossly normal neurologically. Psych:  Alert and cooperative. Normal mood and affect.  Imaging Studies: No results found.  Assessment and Plan:    Hannah Calhoun is a 61 y.o. y/o female has been referred for issues with hemorrhoids and rectal bleeding.  Presently has no rectal bleeding.  Unclear as the underlying cause for this episode of rectal bleeding.  Could be due to hemorrhoids but other differential diagnoses need to be entertained.  Plan 1.  Diagnostic colonoscopy to evaluate rectal bleeding.  Conservative management of internal hemorrhoids discussed and based on colonoscopy will decide about bringing back for banding of internal hemorrhoids if still symptomatic   I have discussed alternative options, risks & benefits,  which include, but are not  limited to, bleeding, infection, perforation,respiratory complication & drug reaction.  The patient agrees with this plan & written consent will be obtained.     Follow up in as needed  Dr Wyline Mood MD,MRCP(U.K)

## 2021-12-30 NOTE — Addendum Note (Signed)
Addended by: Avie Arenas on: 12/30/2021 03:34 PM   Modules accepted: Orders

## 2021-12-30 NOTE — Patient Instructions (Signed)
SITZ [place]

## 2021-12-31 ENCOUNTER — Encounter: Payer: Self-pay | Admitting: Nurse Practitioner

## 2021-12-31 ENCOUNTER — Ambulatory Visit (INDEPENDENT_AMBULATORY_CARE_PROVIDER_SITE_OTHER): Payer: HMO | Admitting: Nurse Practitioner

## 2021-12-31 VITALS — BP 131/76 | HR 66 | Temp 98.9°F | Wt 197.9 lb

## 2021-12-31 DIAGNOSIS — E785 Hyperlipidemia, unspecified: Secondary | ICD-10-CM

## 2021-12-31 DIAGNOSIS — F32A Depression, unspecified: Secondary | ICD-10-CM

## 2021-12-31 DIAGNOSIS — E559 Vitamin D deficiency, unspecified: Secondary | ICD-10-CM | POA: Diagnosis not present

## 2021-12-31 DIAGNOSIS — F419 Anxiety disorder, unspecified: Secondary | ICD-10-CM

## 2021-12-31 DIAGNOSIS — R748 Abnormal levels of other serum enzymes: Secondary | ICD-10-CM | POA: Diagnosis not present

## 2021-12-31 DIAGNOSIS — E1159 Type 2 diabetes mellitus with other circulatory complications: Secondary | ICD-10-CM

## 2021-12-31 DIAGNOSIS — E119 Type 2 diabetes mellitus without complications: Secondary | ICD-10-CM

## 2021-12-31 DIAGNOSIS — I152 Hypertension secondary to endocrine disorders: Secondary | ICD-10-CM | POA: Diagnosis not present

## 2021-12-31 DIAGNOSIS — E1169 Type 2 diabetes mellitus with other specified complication: Secondary | ICD-10-CM

## 2021-12-31 MED ORDER — AMLODIPINE BESYLATE 10 MG PO TABS: 10.0000 mg | ORAL_TABLET | Freq: Every day | ORAL | 1 refills | Status: DC

## 2021-12-31 MED ORDER — HYDROCHLOROTHIAZIDE 25 MG PO TABS
25.0000 mg | ORAL_TABLET | Freq: Every day | ORAL | 1 refills | Status: DC
Start: 2021-12-31 — End: 2022-08-03

## 2021-12-31 MED ORDER — GABAPENTIN 300 MG PO CAPS
ORAL_CAPSULE | ORAL | 1 refills | Status: DC
Start: 2021-12-31 — End: 2023-05-30

## 2021-12-31 MED ORDER — GLIPIZIDE 5 MG PO TABS
ORAL_TABLET | ORAL | 1 refills | Status: DC
Start: 2021-12-31 — End: 2022-08-11

## 2021-12-31 MED ORDER — METOPROLOL SUCCINATE ER 25 MG PO TB24: 25.0000 mg | ORAL_TABLET | Freq: Every day | ORAL | 1 refills | Status: DC

## 2021-12-31 NOTE — Assessment & Plan Note (Signed)
Chronic, stable. Labs ordered today. Continue current regimen of Glipizide 5mg  daily. Had to stop her Ozempic due to an elevated Lipase.  Will recheck Lipase today.  Patient would rather work on diet than add medication.  Will send to diabetes education if A1c is elevated.  Follow up in 6 months.  Call sooner if concerns arise.

## 2021-12-31 NOTE — Progress Notes (Signed)
BP 131/76   Pulse 66   Temp 98.9 F (37.2 C) (Oral)   Wt 197 lb 14.4 oz (89.8 kg)   LMP  (LMP Unknown)   SpO2 99%   BMI 31.94 kg/m    Subjective:    Patient ID: Hannah Calhoun, female    DOB: Jan 02, 1961, 61 y.o.   MRN: [id number]  HPI: Hannah Calhoun is a 61 y.o. female  Chief Complaint  Patient presents with   Hypertension   Hyperlipidemia   Diabetes    6 month follow up - patient reports she would like a ketogenic/dietician clinic for weight loss. She states she does not want to continue weight loss injections.    HYPERTENSION / HYPERLIPIDEMIA Satisfied with current treatment? no Duration of hypertension: years BP monitoring frequency:  occassionaly BP range: 130/70 BP medication side effects: no Past BP meds:  metoprolol, amlodipine and HCTZ Duration of hyperlipidemia: years Cholesterol medication side effects: no Cholesterol supplements: none Past cholesterol medications:no taking Medication compliance: excellent compliance Aspirin: no Recent stressors: no Recurrent headaches: no Visual changes: no Palpitations: no Dyspnea: no Chest pain: no Lower extremity edema: no Dizzy/lightheaded: no  DIABETES Hypoglycemic episodes:no Polydipsia/polyuria: no Visual disturbance: no Chest pain: no Paresthesias: yes Glucose Monitoring: yes  Accucheck frequency: Daily  Fasting glucose: 120-130  Post prandial:  Evening:  Before meals: Taking Insulin?: no  Long acting insulin:  Short acting insulin: Blood Pressure Monitoring: weekly Retinal Examination: Up to Date Foot Exam: Up to Date Diabetic Education: Not Completed Pneumovax: Up to Date Influenza: Up to Date Aspirin: no  ANXIETY/DEPRESSION Patient feels like her mood is "not bad".  Feels like she is doing pretty well.  Denies concerns about medications.  Denies SI.     Relevant past medical, surgical, family and social history reviewed and updated as indicated. Interim medical history  since our last visit reviewed. Allergies and medications reviewed and updated.  Review of Systems  Eyes:  Negative for visual disturbance.  Respiratory:  Negative for cough, chest tightness and shortness of breath.   Cardiovascular:  Negative for chest pain, palpitations and leg swelling.  Musculoskeletal:        Leg cramps  Neurological:  Negative for dizziness and headaches.  Psychiatric/Behavioral:  Negative for dysphoric mood and suicidal ideas. The patient is not nervous/anxious.     Per HPI unless specifically indicated above     Objective:    BP 131/76   Pulse 66   Temp 98.9 F (37.2 C) (Oral)   Wt 197 lb 14.4 oz (89.8 kg)   LMP  (LMP Unknown)   SpO2 99%   BMI 31.94 kg/m   Wt Readings from Last 3 Encounters:  12/31/21 197 lb 14.4 oz (89.8 kg)  12/30/21 199 lb 6.4 oz (90.4 kg)  12/10/21 197 lb (89.4 kg)    Physical Exam Vitals and nursing note reviewed.  Constitutional:      General: She is not in acute distress.    Appearance: Normal appearance. She is normal weight. She is not ill-appearing, toxic-appearing or diaphoretic.  HENT:     Head: Normocephalic.     Right Ear: External ear normal.     Left Ear: External ear normal.     Nose: Nose normal.     Mouth/Throat:     Mouth: Mucous membranes are moist.     Pharynx: Oropharynx is clear.  Eyes:     General:        Right eye: No discharge.  Left eye: No discharge.     Extraocular Movements: Extraocular movements intact.     Conjunctiva/sclera: Conjunctivae normal.     Pupils: Pupils are equal, round, and reactive to light.  Cardiovascular:     Rate and Rhythm: Normal rate and regular rhythm.     Heart sounds: No murmur heard. Pulmonary:     Effort: Pulmonary effort is normal. No respiratory distress.     Breath sounds: Normal breath sounds. No wheezing or rales.  Musculoskeletal:     Cervical back: Normal range of motion and neck supple.  Skin:    General: Skin is warm and dry.     Capillary  Refill: Capillary refill takes less than 2 seconds.  Neurological:     General: No focal deficit present.     Mental Status: She is alert and oriented to person, place, and time. Mental status is at baseline.  Psychiatric:        Mood and Affect: Mood normal.        Behavior: Behavior normal.        Thought Content: Thought content normal.        Judgment: Judgment normal.     Results for orders placed or performed during the hospital encounter of 12/10/21  Comprehensive metabolic panel  Result Value Ref Range   Sodium 140 135 - 145 mmol/L   Potassium 3.2 (L) 3.5 - 5.1 mmol/L   Chloride 103 98 - 111 mmol/L   CO2 28 22 - 32 mmol/L   Glucose, Bld 93 70 - 99 mg/dL   BUN 13 6 - 20 mg/dL   Creatinine, Ser 0.91 0.44 - 1.00 mg/dL   Calcium 9.1 8.9 - 10.3 mg/dL   Total Protein 7.5 6.5 - 8.1 g/dL   Albumin 4.4 3.5 - 5.0 g/dL   AST 20 15 - 41 U/L   ALT 21 0 - 44 U/L   Alkaline Phosphatase 43 38 - 126 U/L   Total Bilirubin 0.6 0.3 - 1.2 mg/dL   GFR, Estimated >60 >60 mL/min   Anion gap 9 5 - 15  CBC  Result Value Ref Range   WBC 10.8 (H) 4.0 - 10.5 K/uL   RBC 4.36 3.87 - 5.11 MIL/uL   Hemoglobin 13.7 12.0 - 15.0 g/dL   HCT 40.8 36.0 - 46.0 %   MCV 93.6 80.0 - 100.0 fL   MCH 31.4 26.0 - 34.0 pg   MCHC 33.6 30.0 - 36.0 g/dL   RDW 12.9 11.5 - 15.5 %   Platelets 420 (H) 150 - 400 K/uL   nRBC 0.0 0.0 - 0.2 %  Lipase, blood  Result Value Ref Range   Lipase 111 (H) 11 - 51 U/L  Type and screen [facility]  Result Value Ref Range   ABO/RH(D) O POS    Antibody Screen NEG    Sample Expiration      12/13/2021,2359 Performed at [facility], [address] [place],  [address]       Assessment & Plan:   Problem List Items Addressed This Visit       Cardiovascular and Mediastinum   Hypertension associated with diabetes ([place]) - Primary    Chronic, stable. Labs ordered today. Continue current regimen of Metoprolol 15m, HCTZ 262mdaily,and  Amlodipine 1022maily. Refills sent today.  Follow up in 6 months.  Call sooner if concerns arise.      Relevant Medications   amLODipine (NORVASC) 10 MG tablet   glipiZIDE (GLUCOTROL)  5 MG tablet   hydrochlorothiazide (HYDRODIURIL) 25 MG tablet   metoprolol succinate (TOPROL-XL) 25 MG 24 hr tablet   Other Relevant Orders   Comp Met (CMET)     Endocrine   DM2 (diabetes mellitus, type 2) (HCC)    Chronic, stable. Labs ordered today. Continue current regimen of Glipizide 57m daily. Had to stop her Ozempic due to an elevated Lipase.  Will recheck Lipase today.  Patient would rather work on diet than add medication.  Will send to diabetes education if A1c is elevated.  Follow up in 6 months.  Call sooner if concerns arise.      Relevant Medications   glipiZIDE (GLUCOTROL) 5 MG tablet   Other Relevant Orders   HgB A1c   Hyperlipidemia associated with type 2 diabetes mellitus (HCC)    Chronic.  Simvastatin was causing her leg cramps.  She is no longer taking the medication.  Labs ordered today.  Return to clinic in 6 months for reevaluation.  Call sooner if concerns arise.        Relevant Medications   amLODipine (NORVASC) 10 MG tablet   glipiZIDE (GLUCOTROL) 5 MG tablet   hydrochlorothiazide (HYDRODIURIL) 25 MG tablet   metoprolol succinate (TOPROL-XL) 25 MG 24 hr tablet   Other Relevant Orders   Lipid Profile     Other   Anxiety and depression    Chronic.  Controlled without medication.  Labs ordered today.  Return to clinic in 6 months for reevaluation.  Call sooner if concerns arise.        Vitamin D deficiency    Labs ordered at visit today.  Will make recommendations based on lab results.        Relevant Orders   Vitamin D (25 hydroxy)   Other Visit Diagnoses     Elevated lipase       Relevant Orders   Lipase        Follow up plan: Return in about 6 months (around 07/03/2022) for Physical and Fasting labs.

## 2021-12-31 NOTE — Assessment & Plan Note (Signed)
Chronic, stable. Labs ordered today. Continue current regimen of Metoprolol 25mg , HCTZ 25mg  daily,and Amlodipine 10mg  daily. Refills sent today.  Follow up in 6 months.  Call sooner if concerns arise.

## 2021-12-31 NOTE — Assessment & Plan Note (Signed)
Chronic.  Simvastatin was causing her leg cramps.  She is no longer taking the medication.  Labs ordered today.  Return to clinic in 6 months for reevaluation.  Call sooner if concerns arise.   

## 2021-12-31 NOTE — Assessment & Plan Note (Signed)
Chronic.  Controlled without medication..  Labs ordered today.  Return to clinic in 6 months for reevaluation.  Call sooner if concerns arise.  ° °

## 2021-12-31 NOTE — Assessment & Plan Note (Signed)
Labs ordered at visit today.  Will make recommendations based on lab results.   

## 2022-01-01 LAB — COMPREHENSIVE METABOLIC PANEL
ALT: 19 IU/L (ref 0–32)
AST: 21 IU/L (ref 0–40)
Albumin/Globulin Ratio: 1.8 (ref 1.2–2.2)
Albumin: 4.4 g/dL (ref 3.8–4.9)
Alkaline Phosphatase: 48 IU/L (ref 44–121)
BUN/Creatinine Ratio: 13 (ref 9–23)
BUN: 12 mg/dL (ref 6–24)
Bilirubin Total: <0.2 mg/dL (ref 0.0–1.2)
CO2: 24 mmol/L (ref 20–29)
Calcium: 9.5 mg/dL (ref 8.7–10.2)
Chloride: 100 mmol/L (ref 96–106)
Creatinine, Ser: 0.93 mg/dL (ref 0.57–1.00)
Globulin, Total: 2.4 g/dL (ref 1.5–4.5)
Glucose: 93 mg/dL (ref 70–99)
Potassium: 3.4 mmol/L — ABNORMAL LOW (ref 3.5–5.2)
Sodium: 140 mmol/L (ref 134–144)
Total Protein: 6.8 g/dL (ref 6.0–8.5)
eGFR: 71 mL/min/{1.73_m2} (ref >59)

## 2022-01-01 LAB — LIPID PANEL
Chol/HDL Ratio: 4.2 ratio (ref 0.0–4.4)
Cholesterol, Total: 242 mg/dL — ABNORMAL HIGH (ref 100–199)
HDL: 57 mg/dL (ref >39)
LDL Chol Calc (NIH): 150 mg/dL — ABNORMAL HIGH (ref 0–99)
Triglycerides: 192 mg/dL — ABNORMAL HIGH (ref 0–149)
VLDL Cholesterol Cal: 35 mg/dL (ref 5–40)

## 2022-01-01 LAB — VITAMIN D 25 HYDROXY (VIT D DEFICIENCY, FRACTURES): Vit D, 25-Hydroxy: 27.2 ng/mL — ABNORMAL LOW (ref 30.0–100.0)

## 2022-01-01 LAB — HEMOGLOBIN A1C
Est. average glucose Bld gHb Est-mCnc: 126 mg/dL
Hgb A1c MFr Bld: 6 % — ABNORMAL HIGH (ref 4.8–5.6)

## 2022-01-01 LAB — LIPASE: Lipase: 25 U/L (ref 14–72)

## 2022-01-03 NOTE — Progress Notes (Signed)
Please let patient know that her lab work looks good.  Her Potassium has improved which is good.  Your cholesterol is still elevated.  Cardiac risk score is also elevated.  Indicating she is at high risk of a cardiac event over the next 10 years.  I recommend she start a different statin once weekly and increase the frequency if tolerating it well.    Vitamin D is also low.  I recommend continuing with vitamin D supplement.    Lipase returned to normal.  A1c is at 6.0.  I do not think she needs to add additional medication at this time.  I would like to see her in 3 months to make sure she is still in a good range.  Please move her appt up.  No other concerns at this time.   The 10-year ASCVD risk score (Arnett DK, et al., 2019) is: 17%   Values used to calculate the score:     Age: 61 years     Sex: Female     Is Non-Hispanic African American: Yes     Diabetic: Yes     Tobacco smoker: No     Systolic Blood Pressure: 131 mmHg     Is BP treated: Yes     HDL Cholesterol: 57 mg/dL     Total Cholesterol: 242 mg/dL

## 2022-01-07 ENCOUNTER — Other Ambulatory Visit: Payer: Self-pay | Admitting: Nurse Practitioner

## 2022-01-07 ENCOUNTER — Encounter: Payer: Self-pay | Admitting: Nurse Practitioner

## 2022-01-07 MED ORDER — FREESTYLE LIBRE 14 DAY READER DEVI: 1.0000 [IU] | 0 refills | Status: DC

## 2022-01-07 MED ORDER — FREESTYLE LIBRE 14 DAY SENSOR MISC
1.0000 [IU] | 1 refills | Status: DC
Start: 2022-01-07 — End: 2022-01-14

## 2022-01-11 MED ORDER — FREESTYLE LIBRE 2 SENSOR MISC: 1.0000 [IU] | 1 refills | Status: DC

## 2022-01-11 MED ORDER — FREESTYLE LIBRE 2 READER DEVI
1.0000 [IU] | 0 refills | Status: DC
Start: 2022-01-11 — End: 2022-01-24

## 2022-01-11 NOTE — Telephone Encounter (Signed)
Requested medication (s) are due for refill today: No  Requested medication (s) are on the active medication list: Yes  Last refill:  01/07/22  Future visit scheduled: Yes  Notes to clinic:  Pharmacy requests Josephine Igo 2 be sent in. See request.    Requested Prescriptions  Pending Prescriptions Disp Refills   Continuous Blood Gluc Sensor (FREESTYLE LIBRE 14 DAY SENSOR) MISC [Pharmacy Med Name: FREESTYLE LIBRE 14 DAY SENSOR]  1    Sig: USE AS DIRECTED ONCE A WEEK     Endocrinology: Diabetes - Testing Supplies Passed - 01/07/2022 11:49 AM      Passed - Valid encounter within last 12 months    Recent Outpatient Visits           1 week ago Hypertension associated with diabetes (HCC)   [facility] Larae Grooms, NP   2 months ago Callus   [facility] Vigg, [place], MD   4 months ago Tinea pedis of right foot   [facility] [place], [place], DO   6 months ago Annual physical exam   [facility] Larae Grooms, NP   11 months ago Chronic pain of both knees   [facility] Larae Grooms, NP       Future Appointments             In 2 months Larae Grooms, NP [facility]   In 5 months Larae Grooms, NP [facility]

## 2022-01-13 ENCOUNTER — Telehealth: Payer: Self-pay

## 2022-01-13 NOTE — Telephone Encounter (Signed)
Please find out what they need clarification on.

## 2022-01-13 NOTE — Telephone Encounter (Signed)
[facility] sent fax to please calify directions for the continuous glucose sensor. Please advise.

## 2022-01-14 ENCOUNTER — Telehealth: Payer: Self-pay

## 2022-01-14 MED ORDER — FREESTYLE LIBRE 14 DAY SENSOR MISC
1.0000 [IU] | 1 refills | Status: DC
Start: 2022-01-14 — End: 2022-01-24

## 2022-01-14 NOTE — Telephone Encounter (Signed)
Medication sent to the pharmacy.

## 2022-01-14 NOTE — Telephone Encounter (Signed)
Prescription has already been sent 

## 2022-01-14 NOTE — Telephone Encounter (Signed)
The wording was incorrect in last telephone encounter. Script should read as applied once every 2 weeks. [facility], thank you! Sorry for the confusion.

## 2022-01-14 NOTE — Addendum Note (Signed)
Addended by: Larae Grooms on: 01/14/2022 11:10 AM   Modules accepted: Orders

## 2022-01-14 NOTE — Telephone Encounter (Signed)
Spoke with pharmacy staff, states freestyle Hannah Calhoun is written as : 1 Units by Does not apply route once a week. Pharmacy staff reports insurance will not cover unless it is written for twice a week. [facility] also requesting for a new [facility] to be sent in.

## 2022-01-17 ENCOUNTER — Ambulatory Visit (INDEPENDENT_AMBULATORY_CARE_PROVIDER_SITE_OTHER): Payer: HMO

## 2022-01-17 DIAGNOSIS — E1159 Type 2 diabetes mellitus with other circulatory complications: Secondary | ICD-10-CM

## 2022-01-17 DIAGNOSIS — E1169 Type 2 diabetes mellitus with other specified complication: Secondary | ICD-10-CM

## 2022-01-17 DIAGNOSIS — E119 Type 2 diabetes mellitus without complications: Secondary | ICD-10-CM

## 2022-01-17 NOTE — Patient Instructions (Signed)
Visit Information   Goals Addressed   None    Patient Care Plan: CCM Pharmacy Care Plan     Problem Identified: Disease State Management   Priority: High  Onset Date: 01/17/2022     Long-Range Goal: Patient-Specific Goal   Start Date: 01/17/2022  Expected End Date: 01/17/2023  This Visit's Progress: On track  Priority: High  Note:   Current Barriers:  Does not contact provider office for questions/concerns  Pharmacist Clinical Goal(s):  Patient will contact provider office for questions/concerns as evidenced notation of same in electronic health record through collaboration with PharmD and provider.   Interventions: 1:1 collaboration with Jon Billings, NP regarding development and update of comprehensive plan of care as evidenced by provider attestation and co-signature Inter-disciplinary care team collaboration (see longitudinal plan of care) Comprehensive medication review performed; medication list updated in electronic medical record  Hypertension (BP goal <130/80) BP Readings from Last 3 Encounters:  12/31/21 131/76  12/30/21 138/78  12/10/21 125/74  -Uncontrolled -Current treatment: HCTZ 38m Appropriate, Query effective Amlodipine 15mAppropriate, Query effective,  Metoprolol Succ 2517mppropriate, Query effective,  Kcl 10m34mID Appropriate, Query effective,  -Medications previously tried: N/A  -Current home readings:  Sept [id number]'Ltolic last week per patient -Current dietary habits: "Tries to eat healthy" -Current exercise habits: None due to hip (Had both replaced) -Denies hypotensive/hypertensive symptoms -Educated on BP goals and benefits of medications for prevention of heart attack, stroke and kidney damage; -Counseled to monitor BP at home daily, document, and provide log at future appointments Sept 2023: Will let PCP know ASAP about BP. Counseled patient to please call me if this happens again  Hyperlipidemia: (LDL goal < 70) The 10-year  ASCVD risk score (Arnett DK, et al., 2019) is: 17%   Values used to calculate the score:     Age: 61 y73rs     Sex: Female     Is Non-Hispanic African American: Yes     Diabetic: Yes     Tobacco smoker: No     Systolic Blood Pressure: 131 381g     Is BP treated: Yes     HDL Cholesterol: 57 mg/dL     Total Cholesterol: 242 mg/dL Lab Results  Component Value Date   CHOL 242 (H) 12/31/2021   CHOL 188 07/05/2021   CHOL 161 09/30/2020   Lab Results  Component Value Date   HDL 57 12/31/2021   HDL 61 07/05/2021   HDL 49 09/30/2020   Lab Results  Component Value Date   LDLCALC 150 (H) 12/31/2021   LDLCALC 107 (H) 07/05/2021   LDLCALC 96 09/30/2020   Lab Results  Component Value Date   TRIG 192 (H) 12/31/2021   TRIG 113 07/05/2021   TRIG 87 09/30/2020   Lab Results  Component Value Date   CHOLHDL 4.2 12/31/2021   CHOLHDL 3.1 07/05/2021   CHOLHDL 3.3 09/30/2020  No results found for: "LDLDIRECT" Last vitamin D Lab Results  Component Value Date   VD25OH 27.2 (L) 12/31/2021   Lab Results  Component Value Date   TSH 1.700 07/05/2021  -Uncontrolled -Current treatment: None -Medications previously tried: Simvastatin (Aches)  -Current dietary patterns: "Tries to eat healthy" -Current exercise habits: None due to hip -Educated on Cholesterol goals;  Sept 2023: Will get Vit D under control and try statin again  Vit D Deficiency (Goal: >30) Last vitamin D Lab Results  Component Value Date   VD25OH 27.2 (L) 12/31/2021  -Uncontrolled -Current treatment  Cholecalciferol  68mg daily Appropriate, Query effective,  -Medications previously tried: N/A  Sept 2023: Patient states takes daily with no non-compliance. Will ask PCP about increasing dose   GERD (Goal: minimize symptoms of reflux ) -Controlled -Current treatment  Famotidine (No longer taking) Query Appropriate,  -Medications previously tried: none reported  -Triggering factors: Spicy food -Hx of Bleeds/ulcers:  No per patient -Counseled on small meals, elevating head, and sleeping on left side Sept 2023: Will ask PCP if it's ok to DC    Diabetes (A1c goal <7%) Lab Results  Component Value Date   HGBA1C 6.0 (H) 12/31/2021   HGBA1C 6.3 (H) 07/05/2021   HGBA1C 6.6 (H) 09/30/2020   Lab Results  Component Value Date   MICROALBUR 80 (H) 07/05/2021   LDLCALC 150 (H) 12/31/2021   CREATININE 0.93 12/31/2021   Lab Results  Component Value Date   NA 140 12/31/2021   K 3.4 (L) 12/31/2021   CREATININE 0.93 12/31/2021   EGFR 71 12/31/2021   GFRNONAA >60 12/10/2021   GLUCOSE 93 12/31/2021   Lab Results  Component Value Date   WBC 10.8 (H) 12/10/2021   HGB 13.7 12/10/2021   HCT 40.8 12/10/2021   MCV 93.6 12/10/2021   PLT 420 (H) 12/10/2021   Lab Results  Component Value Date   LABMICR See below: 07/05/2021   MICROALBUR 80 (H) 07/05/2021   MICROALBUR 10 07/03/2020  -Controlled -Current medications: Glipizide 540mQuery Appropriate,  -Medications previously tried: Ozempic  -Current home glucose readings fasting glucose:  Sept 2023: Patient wasn't at home, didn't have readings on her -Denies hypoglycemic/hyperglycemic symptoms -Current meal patterns:  breakfast: Patient wasn't at home, forgot had appt and didn't have time to go over everything. She had to cut visit short  lunch: Patient wasn't at home, forgot had appt and didn't have time to go over everything. She had to cut visit short  dinner: Patient wasn't at home, forgot had appt and didn't have time to go over everything. She had to cut visit short snacks: Patient wasn't at home, forgot had appt and didn't have time to go over everything. She had to cut visit short drinks: Patient wasn't at home, forgot had appt and didn't have time to go over everything. She had to cut visit short -Current exercise: None due to hip -Educated on A1c and blood sugar goals; -Counseled to check feet daily and get yearly eye exams Sept 2023:  Glipizide not ideal therapy but patient wants to try to do things with diet/exercise and is not open to alternative treatments  Patient Goals/Self-Care Activities Patient will:  - take medications as prescribed as evidenced by patient report and record review  Follow Up Plan: The patient has been provided with contact information for the care management team and has been advised to call with any health related questions or concerns.   CPP F/U PRN, CCM f/u monthly  [facility] - - [phone]     Ms. Heng was given information about Chronic Care Management services today including:  CCM service includes personalized support from designated clinical staff supervised by her physician, including individualized plan of care and coordination with other care providers 24/7 contact phone numbers for assistance for urgent and routine care needs. Standard insurance, coinsurance, copays and deductibles apply for chronic care management only during months in which we provide at least 20 minutes of these services. Most insurances cover these services at 100%, however patients may be responsible for any copay, coinsurance and/or deductible if  applicable. This service may help you avoid the need for more expensive face-to-face services. Only one practitioner may furnish and bill the service in a calendar month. The patient may stop CCM services at any time (effective at the end of the month) by phone call to the office staff.  Patient agreed to services and verbal consent obtained.   The patient verbalized understanding of instructions, educational materials, and care plan provided today and DECLINED offer to receive copy of patient instructions, educational materials, and care plan.  The pharmacy team will reach out to the patient again over the next 30 days.   Lane Hacker, [place]

## 2022-01-17 NOTE — Progress Notes (Signed)
Chronic Care Management Pharmacy Note  01/17/2022 Name:  Hannah Calhoun MRN:  [id number] DOB:  Mar 27, 1961  Summary: -Very pleasant 61 year old female presents for f/u CCM visit. 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 Disability since Sx. -Married 25 years, 2 grandkids, son and daughter  Recommendations/Changes made from today's visit: -Patient stopped famotidine. Will have PCP co-sign note to see if this is satisfactory, defer to PCP -Patients Hypertensive Urgency. Routed msg to PCP already with high priority to assess -Vit D still low and patient states she has been very compliant. Recommend increasing dose. Will have PCP co-sign note to see if this is satisfactory, defer to PCP  Subjective: Hannah Calhoun is an 61 y.o. year old female who is a primary patient of Jon Billings, NP.  The CCM team was consulted for assistance with disease management and care coordination needs.    Engaged with patient by telephone for follow up visit in response to provider referral for pharmacy case management and/or care coordination services.   Consent to Services:  The patient was given the following information about Chronic Care Management services today, agreed to services, and gave verbal consent: 1. CCM service includes personalized support from designated clinical staff supervised by the primary care provider, including individualized plan of care and coordination with other care providers 2. 24/7 contact phone numbers for assistance for urgent and routine care needs. 3. Service will only be billed when office clinical staff spend 20 minutes or more in a month to coordinate care. 4. Only one practitioner may furnish and bill the service in a calendar month. 5.The patient may stop CCM services at any time (effective at the end of the month) by  phone call to the office staff. 6. The patient will be responsible for cost sharing (co-pay) of up to 20% of the service fee (after annual deductible is met). Patient agreed to services and consent obtained.  Patient Care Team: Jon Billings, NP as PCP - General Lane Hacker, [facility] (Pharmacist)  10/19/21 Charlynne Cousins, MD-Internal Medicine ([place]) Orders:none, Medication changes: traMADol (ULTRAM) 50 MG tablet   08/19/21 Valerie Roys, DO-Family Medicine (Tinea pedis of right foot) Orders: None, Medication changes: clotrimazole-betamethasone (LOTRISONE) cream   07/05/21 Jon Billings, NP-PCP (Annual exam) Labs ordered today; Medication changes: none Recent consult visits:  None since last coordination call   Hospital visits:  None in previous 6 months   Objective:  Lab Results  Component Value Date   CREATININE 0.93 12/31/2021   BUN 12 12/31/2021   GFR 72.35 04/28/2015   EGFR 71 12/31/2021   GFRNONAA >60 12/10/2021   GFRAA 81 07/03/2020   NA 140 12/31/2021   K 3.4 (L) 12/31/2021   CALCIUM 9.5 12/31/2021   CO2 24 12/31/2021   GLUCOSE 93 12/31/2021    Lab Results  Component Value Date/Time   HGBA1C 6.0 (H) 12/31/2021 03:07 PM   HGBA1C 6.3 (H) 07/05/2021 10:26 AM   HGBA1C 7.5 (H) 07/03/2020 09:54 AM   GFR 72.35 04/28/2015 01:46 PM   GFR 88.14 09/01/2014 10:49 AM   MICROALBUR 80 (H) 07/05/2021 10:24 AM   MICROALBUR 10 07/03/2020 09:54 AM    Last diabetic Eye exam:  Lab Results  Component Value Date/Time   HMDIABEYEEXA No Retinopathy 12/25/2019 12:00 AM    Last diabetic Foot exam:  Lab  Results  Component Value Date/Time   HMDIABFOOTEX [facility] 06/11/2013 12:00 AM     Lab Results  Component Value Date   CHOL 242 (H) 12/31/2021   HDL 57 12/31/2021   LDLCALC 150 (H) 12/31/2021   TRIG 192 (H) 12/31/2021   CHOLHDL 4.2 12/31/2021       Latest Ref Rng & Units 12/31/2021    3:07 PM 12/10/2021    3:50 PM 07/05/2021   10:26 AM  Hepatic Function  Total  Protein 6.0 - 8.5 g/dL 6.8  7.5  7.0   Albumin 3.8 - 4.9 g/dL 4.4  4.4  4.5   AST 0 - 40 IU/L _0 ALT 0 - 32 IU/L _1 Alk Phosphatase 44 - 121 IU/L 48  43  58   Total Bilirubin 0.0 - 1.2 mg/dL <0.2  0.6  0.2     Lab Results  Component Value Date/Time   TSH 1.700 07/05/2021 10:26 AM   TSH 2.610 07/03/2020 10:42 AM       Latest Ref Rng & Units 12/10/2021    3:50 PM 07/05/2021   10:26 AM 07/03/2020   10:42 AM  CBC  WBC 4.0 - 10.5 K/uL 10.8  9.3  9.2   Hemoglobin 12.0 - 15.0 g/dL 13.7  14.2  13.9   Hematocrit 36.0 - 46.0 % 40.8  42.0  41.3   Platelets 150 - 400 K/uL 420  421  394     Lab Results  Component Value Date/Time   VD25OH 27.2 (L) 12/31/2021 03:07 PM   VD25OH 41.8 07/05/2021 10:26 AM    Clinical ASCVD: Yes  The 10-year ASCVD risk score (Arnett DK, et al., 2019) is: 17%   Values used to calculate the score:     Age: 17 years     Sex: Female     Is Non-Hispanic African American: Yes     Diabetic: Yes     Tobacco smoker: No     Systolic Blood Pressure: 037 mmHg     Is BP treated: Yes     HDL Cholesterol: 57 mg/dL     Total Cholesterol: 242 mg/dL       12/31/2021    3:31 PM 10/19/2021    3:41 PM 07/05/2021   10:03 AM  Depression screen PHQ 2/9  Decreased Interest 0 0 0  Down, Depressed, Hopeless 0 0 0  PHQ - 2 Score 0 0 0  Altered sleeping 1 1 0  Tired, decreased energy 0 0 1  Change in appetite 1 0 0  Feeling bad or failure about yourself  0 0 0  Trouble concentrating 0 0 1  Moving slowly or fidgety/restless 0 0 0  Suicidal thoughts 0 0 0  PHQ-9 Score _2 Difficult doing work/chores Not difficult at all Not difficult at all Not difficult at all     Other: (CHADS2VASc if Afib, MMRC or CAT for COPD, ACT, DEXA)  Social History   Tobacco Use  Smoking Status Former   Packs/day: 0.50   Years: 15.00   Total pack years: 7.50   Types: Cigarettes   Quit date: 06/12/2005   Years since quitting: 16.6  Smokeless Tobacco Never  Tobacco  Comments   quit 2007   BP Readings from Last 3 Encounters:  12/31/21 131/76  12/30/21 138/78  12/10/21 125/74   Pulse Readings from Last 3 Encounters:  12/31/21 66  12/30/21 60  12/10/21 61  Wt Readings from Last 3 Encounters:  12/31/21 197 lb 14.4 oz (89.8 kg)  12/30/21 199 lb 6.4 oz (90.4 kg)  12/10/21 197 lb (89.4 kg)   BMI Readings from Last 3 Encounters:  12/31/21 31.94 kg/m  12/30/21 32.18 kg/m  12/10/21 31.80 kg/m    Assessment/Interventions: Review of patient past medical history, allergies, medications, health status, including review of consultants reports, laboratory and other test data, was performed as part of comprehensive evaluation and provision of chronic care management services.   SDOH:  (Social Determinants of Health) assessments and interventions performed: Yes SDOH Interventions    Flowsheet Row Chronic Care Management from 01/17/2022 in [place] Visit from 12/31/2021 in [place] Visit from 07/05/2021 in [place] from 06/15/2021 in [place] Interventions      Food Insecurity Interventions -- -- -- Intervention Not Indicated  Housing Interventions -- -- -- Intervention Not Indicated  Transportation Interventions Intervention Not Indicated -- -- Intervention Not Indicated  Depression Interventions/Treatment  -- PHQ2-9 Score <4 Follow-up Not Indicated PHQ2-9 Score <4 Follow-up Not Indicated --  Financial Strain Interventions Intervention Not Indicated -- -- Intervention Not Indicated  Physical Activity Interventions -- -- -- Intervention Not Indicated  Stress Interventions -- -- -- Intervention Not Indicated  Social Connections Interventions -- -- -- Intervention Not Indicated      SDOH Screenings   Food Insecurity: No Food Insecurity (06/15/2021)  Housing: Low Risk  (06/15/2021)  Transportation Needs: No Transportation Needs (01/17/2022)  Alcohol Screen:  Low Risk  (06/15/2021)  Depression (PHQ2-9): Low Risk  (12/31/2021)  Financial Resource Strain: Low Risk  (01/17/2022)  Physical Activity: Sufficiently Active (06/15/2021)  Social Connections: Moderately Integrated (06/15/2021)  Stress: No Stress Concern Present (06/15/2021)  Tobacco Use: Medium Risk (12/31/2021)    CCM Care Plan  Allergies  Allergen Reactions   Crestor [Rosuvastatin] Other (See Comments)    myalgia   Doxycycline Diarrhea and Nausea And Vomiting   Nsaids Other (See Comments)    Bleeding ulcer   Other Other (See Comments)   Tolmetin     Other reaction(s): Other (See Comments) Bleeding ulcer   Ace Inhibitors Other (See Comments) and Rash    Significant kidney insufficiency-  each time lisinopril restarted Significant AKI each time lisinopril restarted    Aspirin Rash    Bleeding ulcers    Medications Reviewed Today     Reviewed by Lane Hacker, [facility] (Pharmacist) on 01/17/22 at 1321  Med List Status: <None>   Medication Order Taking? Sig Documenting Provider Last Dose Status Informant  acetaminophen (TYLENOL) 500 MG tablet [id number]  Take 650 mg by mouth 2 (two) times daily. [provider]  Active   amLODipine (NORVASC) 10 MG tablet [id number]  Take 1 tablet (10 mg total) by mouth daily. Jon Billings, NP  Active   Cholecalciferol 25 MCG (1000 UT) capsule [id number]  Take 1,000 Units by mouth daily. [provider]  Active Self  Continuous Blood Gluc Receiver (FREESTYLE LIBRE 14 DAY READER) DEVI [id number]  1 Units by Does not apply route continuous. Jon Billings, NP  Active   Continuous Blood Gluc Receiver (FREESTYLE LIBRE 2 READER) DEVI [id number]  1 Units by Does not apply route continuous. Jon Billings, NP  Active   Continuous Blood Gluc Sensor (FREESTYLE LIBRE 14 DAY SENSOR) [place] [id number]  1 Units by Does not apply route every 14 (fourteen) days. Jon Billings, NP  Active   Continuous Blood Gluc  Sensor (FREESTYLE LIBRE 2 SENSOR)  MISC [id number]  1 Units by Does not apply route once a week. Jon Billings, NP  Active   famotidine (PEPCID) 20 MG tablet [id number]  Take 1 tablet (20 mg total) by mouth 2 (two) times daily. Carrie Mew, MD  Active   fluticasone [facility]) 50 MCG/ACT nasal spray [id number]  PLACE 1 SPRAY INTO BOTH NOSTRILS DAILY. Jon Billings, NP  Active   gabapentin (NEURONTIN) 300 MG capsule [id number]  TAKE 1 CAPSULE BY MOUTH THREE TIMES A Emogene Morgan, NP  Active   glipiZIDE (GLUCOTROL) 5 MG tablet [id number]  TAKE 1 TABLET BY MOUTH TWICE A DAY BEFORE A MEAL-NO MORE REFILLS WITHOUT OFFICE VISIT Jon Billings, NP  Active   hydrochlorothiazide (HYDRODIURIL) 25 MG tablet [id number]  Take 1 tablet (25 mg total) by mouth daily. Jon Billings, NP  Active   metoprolol succinate (TOPROL-XL) 25 MG 24 hr tablet [id number]  Take 1 tablet (25 mg total) by mouth daily. Jon Billings, NP  Active   Multiple Vitamin (MULTI-VITAMIN) tablet [id number]  Take 1 tablet by mouth daily. [provider]  Active   potassium chloride SA (KLOR-CON M20) 20 MEQ tablet [id number]  Take 1 tablet (20 mEq total) by mouth 2 (two) times daily. Jon Billings, NP  Active   witch hazel-glycerin (TUCKS) pad [id number]  Apply 1 Application topically as needed for itching. Carrie Mew, MD  Active             Patient Active Problem List   Diagnosis Date Noted   Callus 10/19/2021   Mononeuropathy of right upper extremity 05/20/2020   Vitamin D deficiency 07/03/2016   Bradycardia 09/08/2015   Seasonal allergies 04/28/2015   Gambling disorder, persistent 04/28/2015   Anxiety and depression 09/01/2014   Obesity (BMI 30-39.9) 03/11/2014   DM2 (diabetes mellitus, type 2) ([place]) 03/11/2014   Hypertension associated with diabetes ([place]) 03/11/2014   Hyperlipidemia associated with type 2 diabetes mellitus ([place]) 03/11/2014   GERD (gastroesophageal reflux disease) 03/11/2014   Arthritis 03/11/2014     Immunization History  Administered Date(s) Administered   Influenza Split 02/28/2014   Influenza,inj,Quad PF,6+ Mos 01/20/2015, 01/13/2016, 02/23/2017, 03/02/2018, 01/21/2019, 01/19/2021   Influenza-Unspecified 01/13/2016, 02/23/2017, 03/02/2018, 01/21/2019, 04/17/2020   Moderna Sars-Covid-2 Vaccination 03/03/2020, 05/14/2021   PFIZER Comirnaty(Gray Top)Covid-19 Tri-Sucrose Vaccine 08/05/2019, 08/27/2019   PFIZER(Purple Top)SARS-COV-2 Vaccination 08/05/2019, 08/27/2019   PPD Test 05/15/2020   Pneumococcal Conjugate-13 01/20/2015   Pneumococcal Polysaccharide-23 12/25/2015   Tdap 03/11/2014    Conditions to be addressed/monitored:  Hypertension, Hyperlipidemia, and Diabetes  Care Plan : [place]  Updates made by Lane Hacker, [place] since 01/17/2022 12:00 AM     Problem: Disease State Management   Priority: High  Onset Date: 01/17/2022     Long-Range Goal: Patient-Specific Goal   Start Date: 01/17/2022  Expected End Date: 01/17/2023  This Visit's Progress: On track  Priority: High  Note:   Current Barriers:  Does not contact provider office for questions/concerns  Pharmacist Clinical Goal(s):  Patient will contact provider office for questions/concerns as evidenced notation of same in electronic health record through collaboration with PharmD and provider.   Interventions: 1:1 collaboration with Jon Billings, NP regarding development and update of comprehensive plan of care as evidenced by provider attestation and co-signature Inter-disciplinary care team collaboration (see longitudinal plan of care) Comprehensive medication review performed; medication list updated in electronic medical record  Hypertension (BP goal <130/80) BP Readings from Last 3 Encounters:  12/31/21 131/76  12/30/21 138/78  12/10/21 125/74  -Uncontrolled -Current treatment: HCTZ 52m Appropriate, Query effective Amlodipine 151mAppropriate, Query effective,  Metoprolol  Succ 251mppropriate, Query effective,  Kcl 3m58mID Appropriate, Query effective,  -Medications previously tried: N/A  -Current home readings:  Sept [id number]'[place] last week per patient -Current dietary habits: "Tries to eat healthy" -Current exercise habits: None due to hip (Had both replaced) -Denies hypotensive/hypertensive symptoms -Educated on BP goals and benefits of medications for prevention of heart attack, stroke and kidney damage; -Counseled to monitor BP at home daily, document, and provide log at future appointments Sept 2023: Will let PCP know ASAP about BP. Counseled patient to please call me if this happens again  Hyperlipidemia: (LDL goal < 70) The 10-year ASCVD risk score (Arnett DK, et al., 2019) is: 17%   Values used to calculate the score:     Age: 89 y17rs     Sex: Female     Is Non-Hispanic African American: Yes     Diabetic: Yes     Tobacco smoker: No     Systolic Blood Pressure: 131 932g     Is BP treated: Yes     HDL Cholesterol: 57 mg/dL     Total Cholesterol: 242 mg/dL Lab Results  Component Value Date   CHOL 242 (H) 12/31/2021   CHOL 188 07/05/2021   CHOL 161 09/30/2020   Lab Results  Component Value Date   HDL 57 12/31/2021   HDL 61 07/05/2021   HDL 49 09/30/2020   Lab Results  Component Value Date   LDLCALC 150 (H) 12/31/2021   LDLCALC 107 (H) 07/05/2021   LDLCALC 96 09/30/2020   Lab Results  Component Value Date   TRIG 192 (H) 12/31/2021   TRIG 113 07/05/2021   TRIG 87 09/30/2020   Lab Results  Component Value Date   CHOLHDL 4.2 12/31/2021   CHOLHDL 3.1 07/05/2021   CHOLHDL 3.3 09/30/2020  No results found for: "LDLDIRECT" Last vitamin D Lab Results  Component Value Date   VD25OH 27.2 (L) 12/31/2021   Lab Results  Component Value Date   TSH 1.700 07/05/2021  -Uncontrolled -Current treatment: None -Medications previously tried: Simvastatin (Aches)  -Current dietary patterns: "Tries to eat healthy" -Current  exercise habits: None due to hip -Educated on Cholesterol goals;  Sept 2023: Will get Vit D under control and try statin again  Vit D Deficiency (Goal: >30) Last vitamin D Lab Results  Component Value Date   VD25OH 27.2 (L) 12/31/2021  -Uncontrolled -Current treatment  Cholecalciferol 25mc9mily Appropriate, Query effective,  -Medications previously tried: N/A  Sept 2023: Patient states takes daily with no non-compliance. Will ask PCP about increasing dose   GERD (Goal: minimize symptoms of reflux ) -Controlled -Current treatment  Famotidine (No longer taking) Query Appropriate,  -Medications previously tried: none reported  -Triggering factors: Spicy food -Hx of Bleeds/ulcers: No per patient -Counseled on small meals, elevating head, and sleeping on left side Sept 2023: Will ask PCP if it's ok to DC    Diabetes (A1c goal <7%) Lab Results  Component Value Date   HGBA1C 6.0 (H) 12/31/2021   HGBA1C 6.3 (H) 07/05/2021   HGBA1C 6.6 (H) 09/30/2020   Lab Results  Component Value Date   MICROALBUR 80 (H) 07/05/2021   LDLCALC 150 (H) 12/31/2021   CREATININE 0.93 12/31/2021   Lab Results  Component Value Date   NA 140 12/31/2021   K 3.4 (L) 12/31/2021   CREATININE 0.93 12/31/2021  EGFR 71 12/31/2021   GFRNONAA >60 12/10/2021   GLUCOSE 93 12/31/2021   Lab Results  Component Value Date   WBC 10.8 (H) 12/10/2021   HGB 13.7 12/10/2021   HCT 40.8 12/10/2021   MCV 93.6 12/10/2021   PLT 420 (H) 12/10/2021   Lab Results  Component Value Date   LABMICR See below: 07/05/2021   MICROALBUR 80 (H) 07/05/2021   MICROALBUR 10 07/03/2020  -Controlled -Current medications: Glipizide 24m Query Appropriate,  -Medications previously tried: Ozempic  -Current home glucose readings fasting glucose:  Sept 2023: Patient wasn't at home, didn't have readings on her -Denies hypoglycemic/hyperglycemic symptoms -Current meal patterns:  breakfast: Patient wasn't at home, forgot had  appt and didn't have time to go over everything. She had to cut visit short  lunch: Patient wasn't at home, forgot had appt and didn't have time to go over everything. She had to cut visit short  dinner: Patient wasn't at home, forgot had appt and didn't have time to go over everything. She had to cut visit short snacks: Patient wasn't at home, forgot had appt and didn't have time to go over everything. She had to cut visit short drinks: Patient wasn't at home, forgot had appt and didn't have time to go over everything. She had to cut visit short -Current exercise: None due to hip -Educated on A1c and blood sugar goals; -Counseled to check feet daily and get yearly eye exams Sept 2023: Glipizide not ideal therapy but patient wants to try to do things with diet/exercise and is not open to alternative treatments  Patient Goals/Self-Care Activities Patient will:  - take medications as prescribed as evidenced by patient report and record review  Follow Up Plan: The patient has been provided with contact information for the care management team and has been advised to call with any health related questions or concerns.   CPP F/U PRN, CCM f/u monthly  NArizona Constable Pharm.D. - [phone]        Medication Assistance: None required.  Patient affirms current coverage meets needs.  Care Gaps: Colonoscopy-05/09/12 Diabetic Foot Exam-07/05/21 Mammogram-NA Ophthalmology-12/25/19 Dexa Scan - NA Annual Well Visit - 07/05/21 Micro albumin-07/05/21 Hemoglobin A1c- 07/05/21   Star Rating Drugs: Glipizide 5 mg-last fill 07/05/21  Semaglutide 0.25 or 0.5 mg/dose-last fill 07/05/21  Patient's preferred pharmacy is:  [id number] [place] [address]: [phone]: [phone] pill box? No -   Pt endorses 100% compliance   Care Plan and Follow Up Patient Decision:  Patient agrees to Care Plan and Follow-up.  Plan: The  patient has been provided with contact information for the care management team and has been advised to call with any health related questions or concerns.   CPP F/U PRN. CCM F/U Monthly  NaArizona ConstablePharm.D. - - [phone]

## 2022-01-17 NOTE — Progress Notes (Signed)
Lvm asking patient to call back to schedule a sooner appointment

## 2022-01-19 ENCOUNTER — Ambulatory Visit: Payer: HMO

## 2022-01-19 ENCOUNTER — Encounter: Payer: Self-pay | Admitting: Gastroenterology

## 2022-01-24 ENCOUNTER — Encounter: Payer: Self-pay | Admitting: Nurse Practitioner

## 2022-01-24 ENCOUNTER — Ambulatory Visit (INDEPENDENT_AMBULATORY_CARE_PROVIDER_SITE_OTHER): Payer: HMO | Admitting: Nurse Practitioner

## 2022-01-24 ENCOUNTER — Other Ambulatory Visit: Payer: Self-pay | Admitting: Nurse Practitioner

## 2022-01-24 VITALS — BP 156/74 | HR 55 | Temp 98.0°F | Wt 197.7 lb

## 2022-01-24 DIAGNOSIS — Z23 Encounter for immunization: Secondary | ICD-10-CM

## 2022-01-24 DIAGNOSIS — E1159 Type 2 diabetes mellitus with other circulatory complications: Secondary | ICD-10-CM

## 2022-01-24 DIAGNOSIS — E559 Vitamin D deficiency, unspecified: Secondary | ICD-10-CM | POA: Diagnosis not present

## 2022-01-24 DIAGNOSIS — I152 Hypertension secondary to endocrine disorders: Secondary | ICD-10-CM | POA: Diagnosis not present

## 2022-01-24 MED ORDER — VALSARTAN 40 MG PO TABS: 40.0000 mg | ORAL_TABLET | Freq: Every day | ORAL | 1 refills | Status: DC

## 2022-01-24 MED ORDER — VITAMIN D (ERGOCALCIFEROL) 1.25 MG (50000 UNIT) PO CAPS: 50000.0000 [IU] | ORAL_CAPSULE | ORAL | 1 refills | Status: DC

## 2022-01-24 NOTE — Assessment & Plan Note (Signed)
Chronic. Still running low.  Will increase Vitamin D to 50,000 IU once weekly. Will draw labs at follow up visit in 12 weeks.

## 2022-01-24 NOTE — Progress Notes (Signed)
BP (!) 156/74   Pulse (!) 55   Temp 98 F (36.7 C) (Oral)   Wt 197 lb 11.2 oz (89.7 kg)   LMP  (LMP Unknown)   SpO2 98%   BMI 31.91 kg/m    Subjective:    Patient ID: Hannah Calhoun, female    DOB: [birth year], 61 y.o.   MRN: [id number]  HPI: Hannah Calhoun is a 61 y.o. female  Chief Complaint  Patient presents with   Hypertension    Follow up    HYPERTENSION without Chronic Kidney Disease Hypertension status: uncontrolled  Satisfied with current treatment? no Duration of hypertension: years BP monitoring frequency:  weekly BP range: 140/80 BP medication side effects:  no Medication compliance: excellent compliance Previous BP meds: Metoprolol, amlodipine, and HCTZ Aspirin: no Recurrent headaches: no Visual changes: no Palpitations: no Dyspnea: no Chest pain: no Lower extremity edema: no Dizzy/lightheaded: no  Relevant past medical, surgical, family and social history reviewed and updated as indicated. Interim medical history since our last visit reviewed. Allergies and medications reviewed and updated.  Review of Systems  Eyes:  Negative for visual disturbance.  Respiratory:  Negative for cough, chest tightness and shortness of breath.   Cardiovascular:  Negative for chest pain, palpitations and leg swelling.  Neurological:  Negative for dizziness and headaches.    Per HPI unless specifically indicated above     Objective:    BP (!) 156/74   Pulse (!) 55   Temp 98 F (36.7 C) (Oral)   Wt 197 lb 11.2 oz (89.7 kg)   LMP  (LMP Unknown)   SpO2 98%   BMI 31.91 kg/m   Wt Readings from Last 3 Encounters:  01/24/22 197 lb 11.2 oz (89.7 kg)  12/31/21 197 lb 14.4 oz (89.8 kg)  12/30/21 199 lb 6.4 oz (90.4 kg)    Physical Exam Vitals and nursing note reviewed.  Constitutional:      General: She is not in acute distress.    Appearance: Normal appearance. She is normal weight. She is not ill-appearing, toxic-appearing or diaphoretic.   HENT:     Head: Normocephalic.     Right Ear: External ear normal.     Left Ear: External ear normal.     Nose: Nose normal.     Mouth/Throat:     Mouth: Mucous membranes are moist.     Pharynx: Oropharynx is clear.  Eyes:     General:        Right eye: No discharge.        Left eye: No discharge.     Extraocular Movements: Extraocular movements intact.     Conjunctiva/sclera: Conjunctivae normal.     Pupils: Pupils are equal, round, and reactive to light.  Cardiovascular:     Rate and Rhythm: Normal rate and regular rhythm.     Heart sounds: No murmur heard. Pulmonary:     Effort: Pulmonary effort is normal. No respiratory distress.     Breath sounds: Normal breath sounds. No wheezing or rales.  Musculoskeletal:     Cervical back: Normal range of motion and neck supple.  Skin:    General: Skin is warm and dry.     Capillary Refill: Capillary refill takes less than 2 seconds.  Neurological:     General: No focal deficit present.     Mental Status: She is alert and oriented to person, place, and time. Mental status is at baseline.  Psychiatric:  Mood and Affect: Mood normal.        Behavior: Behavior normal.        Thought Content: Thought content normal.        Judgment: Judgment normal.     Results for orders placed or performed in visit on 12/31/21  Comp Met (CMET)  Result Value Ref Range   Glucose 93 70 - 99 mg/dL   BUN 12 6 - 24 mg/dL   Creatinine, Ser 0.93 0.57 - 1.00 mg/dL   eGFR 71 >59 mL/min/1.73   BUN/Creatinine Ratio 13 9 - 23   Sodium 140 134 - 144 mmol/L   Potassium 3.4 (L) 3.5 - 5.2 mmol/L   Chloride 100 96 - 106 mmol/L   CO2 24 20 - 29 mmol/L   Calcium 9.5 8.7 - 10.2 mg/dL   Total Protein 6.8 6.0 - 8.5 g/dL   Albumin 4.4 3.8 - 4.9 g/dL   Globulin, Total 2.4 1.5 - 4.5 g/dL   Albumin/Globulin Ratio 1.8 1.2 - 2.2   Bilirubin Total <0.2 0.0 - 1.2 mg/dL   Alkaline Phosphatase 48 44 - 121 IU/L   AST 21 0 - 40 IU/L   ALT 19 0 - 32 IU/L  Lipid  Profile  Result Value Ref Range   Cholesterol, Total 242 (H) 100 - 199 mg/dL   Triglycerides 192 (H) 0 - 149 mg/dL   HDL 57 >39 mg/dL   VLDL Cholesterol Cal 35 5 - 40 mg/dL   LDL Chol Calc (NIH) 150 (H) 0 - 99 mg/dL   Chol/HDL Ratio 4.2 0.0 - 4.4 ratio  HgB A1c  Result Value Ref Range   Hgb A1c MFr Bld 6.0 (H) 4.8 - 5.6 %   Est. average glucose Bld gHb Est-mCnc 126 mg/dL  Vitamin D (25 hydroxy)  Result Value Ref Range   Vit D, 25-Hydroxy 27.2 (L) 30.0 - 100.0 ng/mL  Lipase  Result Value Ref Range   Lipase 25 14 - 72 U/L      Assessment & Plan:   Problem List Items Addressed This Visit       Cardiovascular and Mediastinum   Hypertension associated with diabetes ([place]) - Primary    Chronic.  Running 140/80-90s at home.  Continue with amlodipine, HCTZ, and metoprolol.  Will add Valsartan 27m daily.  Side effects and benefits discussed during visit.  Follow up in 1 month for reevaluation.  Continue to check blood pressures at home.      Relevant Medications   valsartan (DIOVAN) 40 MG tablet     Other   Vitamin D deficiency    Chronic. Still running low.  Will increase Vitamin D to 50,000 IU once weekly. Will draw labs at follow up visit in 12 weeks.      Other Visit Diagnoses     Need for influenza vaccination       Relevant Orders   Flu Vaccine QUAD 6+ mos PF IM (Fluarix Quad PF)        Follow up plan: Return in about 1 month (around 02/23/2022) for BP Check.

## 2022-01-24 NOTE — Assessment & Plan Note (Signed)
Chronic.  Running 140/80-90s at home.  Continue with amlodipine, HCTZ, and metoprolol.  Will add Valsartan 40mg  daily.  Side effects and benefits discussed during visit.  Follow up in 1 month for reevaluation.  Continue to check blood pressures at home.

## 2022-01-25 NOTE — Telephone Encounter (Signed)
Requested Prescriptions  Pending Prescriptions Disp Refills  . KLOR-CON M20 20 MEQ tablet [Pharmacy Med Name: KLOR-CON M20 TABLET] 180 tablet 1    Sig: TAKE 1 TABLET BY MOUTH TWICE A DAY     Endocrinology:  Minerals - Potassium Supplementation Failed - 01/24/2022  6:36 PM      Failed - K in normal range and within 360 days    Potassium  Date Value Ref Range Status  12/31/2021 3.4 (L) 3.5 - 5.2 mmol/L Final  01/22/2013 3.1 (L) 3.5 - 5.1 mmol/L Final         Passed - Cr in normal range and within 360 days    Creatinine  Date Value Ref Range Status  01/22/2013 0.86 0.60 - 1.30 mg/dL Final   Creatinine, Ser  Date Value Ref Range Status  12/31/2021 0.93 0.57 - 1.00 mg/dL Final   Creatinine,U  Date Value Ref Range Status  09/10/2014 65.8 mg/dL Final         Passed - Valid encounter within last 12 months    Recent Outpatient Visits          Yesterday Hypertension associated with diabetes [facility])   [facility] Jon Billings, NP   3 weeks ago Hypertension associated with diabetes ([place])   [place], Karen, NP   3 months ago [place], MD   5 months ago Tinea pedis of right foot   [place], [place], DO   6 months ago Annual physical exam   [facility] Jon Billings, NP      Future Appointments            In 4 weeks Jon Billings, NP [facility], [place]   In 2 months Jon Billings, NP [facility], [place]   In 5 months Jon Billings, NP [facility], [place]

## 2022-01-27 MED ORDER — FREESTYLE LIBRE 2 READER DEVI: 1.0000 | 0 refills | Status: DC

## 2022-01-27 MED ORDER — FREESTYLE LIBRE 2 SENSOR MISC: 1.0000 | 1 refills | Status: DC

## 2022-02-03 ENCOUNTER — Encounter: Payer: Self-pay | Admitting: Gastroenterology

## 2022-02-04 ENCOUNTER — Ambulatory Visit: Payer: HMO | Admitting: Certified Registered"

## 2022-02-04 ENCOUNTER — Encounter
Admission: RE | Disposition: A | Discharge status: Home / Self Care | Payer: Self-pay | Source: Home / Self Care | Attending: Gastroenterology

## 2022-02-04 ENCOUNTER — Ambulatory Visit
Admission: RE | Admit: 2022-02-04 | Discharge: 2022-02-04 | Disposition: A | Discharge status: Home / Self Care | Payer: HMO | Attending: Gastroenterology | Admitting: Gastroenterology

## 2022-02-04 DIAGNOSIS — K625 Hemorrhage of anus and rectum: Secondary | ICD-10-CM

## 2022-02-04 DIAGNOSIS — Z87891 Personal history of nicotine dependence: Secondary | ICD-10-CM | POA: Diagnosis not present

## 2022-02-04 DIAGNOSIS — K640 First degree hemorrhoids: Secondary | ICD-10-CM | POA: Diagnosis not present

## 2022-02-04 DIAGNOSIS — I10 Essential (primary) hypertension: Secondary | ICD-10-CM | POA: Insufficient documentation

## 2022-02-04 DIAGNOSIS — K5730 Diverticulosis of large intestine without perforation or abscess without bleeding: Secondary | ICD-10-CM | POA: Diagnosis not present

## 2022-02-04 DIAGNOSIS — K644 Residual hemorrhoidal skin tags: Secondary | ICD-10-CM | POA: Insufficient documentation

## 2022-02-04 DIAGNOSIS — Z7984 Long term (current) use of oral hypoglycemic drugs: Secondary | ICD-10-CM | POA: Diagnosis not present

## 2022-02-04 DIAGNOSIS — Z8249 Family history of ischemic heart disease and other diseases of the circulatory system: Secondary | ICD-10-CM | POA: Diagnosis not present

## 2022-02-04 DIAGNOSIS — E119 Type 2 diabetes mellitus without complications: Secondary | ICD-10-CM | POA: Diagnosis not present

## 2022-02-04 HISTORY — PX: COLONOSCOPY WITH PROPOFOL: SHX5780

## 2022-02-04 LAB — GLUCOSE, CAPILLARY: Glucose-Capillary: 144 mg/dL — ABNORMAL HIGH (ref 70–99)

## 2022-02-04 SURGERY — COLONOSCOPY WITH PROPOFOL
Anesthesia: General

## 2022-02-04 MED ORDER — PROPOFOL 10 MG/ML IV BOLUS
INTRAVENOUS | Status: DC | PRN
  Administered 2022-02-04: 80 mg via INTRAVENOUS

## 2022-02-04 MED ORDER — PROPOFOL 500 MG/50ML IV EMUL
INTRAVENOUS | Status: DC | PRN
  Administered 2022-02-04: 150 ug/kg/min via INTRAVENOUS

## 2022-02-04 MED ORDER — STERILE WATER FOR IRRIGATION IR SOLN
Status: DC | PRN
  Administered 2022-02-04: 60 mL
  Administered 2022-02-04: 120 mL

## 2022-02-04 MED ORDER — SODIUM CHLORIDE 0.9 % IV SOLN: INTRAVENOUS | Status: DC

## 2022-02-04 MED ORDER — PROPOFOL 1000 MG/100ML IV EMUL
INTRAVENOUS | Status: AC
  Filled 2022-02-04: qty 100

## 2022-02-04 NOTE — Op Note (Signed)
[facility] Gastroenterology Patient Name: Hannah Calhoun Procedure Date: 02/04/2022 8:06 AM MRN: [id number] Account #: [id number] Date of Birth: 07/05/1960 Admit Type: Outpatient Age: 61 Room: [facility] ENDO ROOM 4 Gender: Female Note Status: Finalized Instrument Name: Prentice Docker [id number] Procedure:             Colonoscopy Indications:           Rectal bleeding Providers:             Wyline Mood MD, MD Referring MD:          Larae Grooms (Referring MD) Medicines:             Monitored Anesthesia Care Complications:         No immediate complications. Procedure:             Pre-Anesthesia Assessment:                        - Prior to the procedure, a History and Physical was                         performed, and patient medications, allergies and                         sensitivities were reviewed. The patient's tolerance                         of previous anesthesia was reviewed.                        - The risks and benefits of the procedure and the                         sedation options and risks were discussed with the                         patient. All questions were answered and informed                         consent was obtained.                        - ASA Grade Assessment: II - A patient with mild                         systemic disease.                        After obtaining informed consent, the colonoscope was                         passed under direct vision. Throughout the procedure,                         the patient's blood pressure, pulse, and oxygen                         saturations were monitored continuously. The                         Colonoscope was introduced through the anus and  advanced to the the cecum, identified by the                         appendiceal orifice. The colonoscopy was performed                         with ease. The patient tolerated the procedure well.                         The  quality of the bowel preparation was excellent. Findings:      The perianal and digital rectal examinations were normal.      Non-bleeding external and internal hemorrhoids were found during       retroflexion. The hemorrhoids were large and Grade I (internal       hemorrhoids that do not prolapse).      Multiple small-mouthed diverticula were found in the sigmoid colon.      The exam was otherwise without abnormality on direct and retroflexion       views. Impression:            - Non-bleeding external and internal hemorrhoids.                        - Diverticulosis in the sigmoid colon.                        - The examination was otherwise normal on direct and                         retroflexion views.                        - No specimens collected. Recommendation:        - Discharge patient to home (with escort).                        - Resume previous diet.                        - Continue present medications.                        - Repeat colonoscopy in 10 years for screening                         purposes.                        - - Return to my office if has rectal bleeding for                         hemorroidal banding Procedure Code(s):     --- Professional ---                        [phone], Colonoscopy, flexible; diagnostic, including                         collection of specimen(s) by brushing or washing, when  performed (separate procedure) Diagnosis Code(s):     --- Professional ---                        K64.0, First degree hemorrhoids                        K62.5, Hemorrhage of anus and rectum                        K57.30, Diverticulosis of large intestine without                         perforation or abscess without bleeding CPT copyright 2019 American Medical Association. All rights reserved. The codes documented in this report are preliminary and upon coder review may  be revised to meet current compliance requirements. Jonathon Bellows, MD Jonathon Bellows MD, MD 02/04/2022 8:24:48 AM This report has been signed electronically. Number of Addenda: 0 Note Initiated On: 02/04/2022 8:06 AM Scope Withdrawal Time: 0 hours 9 minutes 7 seconds  Total Procedure Duration: 0 hours 11 minutes 51 seconds  Estimated Blood Loss:  Estimated blood loss: none.      [facility]

## 2022-02-04 NOTE — Anesthesia Preprocedure Evaluation (Signed)
Anesthesia Evaluation  Patient identified by MRN, date of birth, ID band Patient awake    Reviewed: Allergy & Precautions, NPO status , Patient's Chart, lab work & pertinent test results  History of Anesthesia Complications Negative for: history of anesthetic complications  Airway Mallampati: III  TM Distance: >3 FB Neck ROM: full    Dental  (+) Dental Advidsory Given   Pulmonary neg pulmonary ROS, neg shortness of breath, neg COPD, former smoker,    Pulmonary exam normal        Cardiovascular hypertension, (-) Past MI and (-) CABG negative cardio ROS Normal cardiovascular exam     Neuro/Psych negative neurological ROS  negative psych ROS   GI/Hepatic negative GI ROS, Neg liver ROS,   Endo/Other  negative endocrine ROSdiabetes  Renal/GU negative Renal ROS  negative genitourinary   Musculoskeletal   Abdominal   Peds  Hematology negative hematology ROS (+)   Anesthesia Other Findings Past Medical History: No date: Allergy No date: Arthritis No date: Chicken pox No date: Depression No date: Diabetes mellitus without complication (HCC) No date: Diverticulitis No date: GERD (gastroesophageal reflux disease) No date: History of colon polyps No date: Hyperlipidemia No date: Hypertension  Past Surgical History: 1990: BREAST BIOPSY; Right     Comment:  EXCISIONAL - NEG 1990: BREAST SURGERY; Right No date: CHOLECYSTECTOMY No date: JOINT REPLACEMENT 2010: KNEE ARTHROSCOPY 2010: NASAL SEPTUM SURGERY No date: TONSILLECTOMY 2009: TOTAL ABDOMINAL HYSTERECTOMY No date: TOTAL HIP ARTHROPLASTY  BMI    Body Mass Index: 30.99 kg/m      Reproductive/Obstetrics negative OB ROS                             Anesthesia Physical Anesthesia Plan  ASA: 2  Anesthesia Plan: General   Post-op Pain Management: Minimal or no pain anticipated   Induction: Intravenous  PONV Risk Score and Plan:  3 and Propofol infusion, TIVA and Ondansetron  Airway Management Planned: Nasal Cannula  Additional Equipment: None  Intra-op Plan:   Post-operative Plan:   Informed Consent: I have reviewed the patients History and Physical, chart, labs and discussed the procedure including the risks, benefits and alternatives for the proposed anesthesia with the patient or authorized representative who has indicated his/her understanding and acceptance.     Dental advisory given  Plan Discussed with: CRNA and Surgeon  Anesthesia Plan Comments: (Discussed risks of anesthesia with patient, including possibility of difficulty with spontaneous ventilation under anesthesia necessitating airway intervention, PONV, and rare risks such as cardiac or respiratory or neurological events, and allergic reactions. Discussed the role of CRNA in patient's perioperative care. Patient understands.)        Anesthesia Quick Evaluation

## 2022-02-04 NOTE — Anesthesia Procedure Notes (Signed)
Procedure Name: MAC Date/Time: 02/04/2022 8:12 AM  Performed by: Biagio Borg, CRNAPre-anesthesia Checklist: Patient identified, Emergency Drugs available, Suction available, Patient being monitored and Timeout performed Patient Re-evaluated:Patient Re-evaluated prior to induction Oxygen Delivery Method: Nasal cannula Induction Type: IV induction Placement Confirmation: positive ETCO2 and CO2 detector

## 2022-02-04 NOTE — Anesthesia Postprocedure Evaluation (Signed)
Anesthesia Post Note  Patient: Hannah Calhoun  Procedure(s) Performed: COLONOSCOPY WITH PROPOFOL  Patient location during evaluation: Endoscopy Anesthesia Type: General Level of consciousness: awake and alert Pain management: pain level controlled Vital Signs Assessment: post-procedure vital signs reviewed and stable Respiratory status: spontaneous breathing, nonlabored ventilation, respiratory function stable and patient connected to nasal cannula oxygen Cardiovascular status: blood pressure returned to baseline and stable Postop Assessment: no apparent nausea or vomiting Anesthetic complications: no   No notable events documented.   Last Vitals:  Vitals:   02/04/22 0826 02/04/22 0836  BP: 107/61 116/76  Pulse: 60 (!) 59  Resp: (!) 23 13  Temp:    SpO2: 100% 100%    Last Pain:  Vitals:   02/04/22 0836  PainSc: 0-No pain                 Dimas Millin

## 2022-02-04 NOTE — H&P (Signed)
Wyline Mood, MD [address], [address], [place], [place], [address] [address], [place], [place], [address] Phone: [phone]  Fax: [phone]  Primary Care Physician:  Larae Grooms, NP   Pre-Procedure History & Physical: HPI:  Hannah Calhoun is a 61 y.o. female is here for an colonoscopy.   Past Medical History:  Diagnosis Date   Allergy    Arthritis    Chicken pox    Depression    Diabetes mellitus without complication (HCC)    Diverticulitis    GERD (gastroesophageal reflux disease)    History of colon polyps    Hyperlipidemia    Hypertension     Past Surgical History:  Procedure Laterality Date   BREAST BIOPSY Right 1990   EXCISIONAL - NEG   BREAST SURGERY Right 1990   CHOLECYSTECTOMY     JOINT REPLACEMENT     KNEE ARTHROSCOPY  2010   NASAL SEPTUM SURGERY  2010   TONSILLECTOMY     TOTAL ABDOMINAL HYSTERECTOMY  2009   TOTAL HIP ARTHROPLASTY      Prior to Admission medications   Medication Sig Start Date End Date Taking? Authorizing Provider  amLODipine (NORVASC) 10 MG tablet Take 1 tablet (10 mg total) by mouth daily. 12/31/21  Yes Larae Grooms, NP  gabapentin (NEURONTIN) 300 MG capsule TAKE 1 CAPSULE BY MOUTH THREE TIMES A DAY 12/31/21  Yes Larae Grooms, NP  glipiZIDE (GLUCOTROL) 5 MG tablet TAKE 1 TABLET BY MOUTH TWICE A DAY BEFORE A MEAL-NO MORE REFILLS WITHOUT OFFICE VISIT 12/31/21  Yes Larae Grooms, NP  hydrochlorothiazide (HYDRODIURIL) 25 MG tablet Take 1 tablet (25 mg total) by mouth daily. 12/31/21  Yes Larae Grooms, NP  KLOR-CON M20 20 MEQ tablet TAKE 1 TABLET BY MOUTH TWICE A DAY 01/25/22  Yes Larae Grooms, NP  metoprolol succinate (TOPROL-XL) 25 MG 24 hr tablet Take 1 tablet (25 mg total) by mouth daily. 12/31/21  Yes Larae Grooms, NP  valsartan (DIOVAN) 40 MG tablet Take 1 tablet (40 mg total) by mouth daily. 01/24/22  Yes Larae Grooms, NP  acetaminophen (TYLENOL) 500 MG tablet Take 650 mg  by mouth 2 (two) times daily.    [provider]  Cholecalciferol 25 MCG (1000 UT) capsule Take 1,000 Units by mouth daily. 05/03/18   [provider]  Continuous Blood Gluc Receiver (FREESTYLE LIBRE 2 READER) DEVI 1 Device by Does not apply route continuous. 01/27/22   Larae Grooms, NP  Continuous Blood Gluc Sensor (FREESTYLE LIBRE 2 SENSOR) MISC 1 Application by Does not apply route once a week. 01/27/22   Larae Grooms, NP  famotidine (PEPCID) 20 MG tablet Take 1 tablet (20 mg total) by mouth 2 (two) times daily. Patient not taking: Reported on 01/17/2022 12/10/21   Sharman Cheek, MD  fluticasone [facility]) 50 MCG/ACT nasal spray PLACE 1 SPRAY INTO BOTH NOSTRILS DAILY. Patient not taking: Reported on 01/24/2022 10/15/20   Larae Grooms, NP  Multiple Vitamin (MULTI-VITAMIN) tablet Take 1 tablet by mouth daily.    [provider]  Vitamin D, Ergocalciferol, (DRISDOL) 1.25 MG (50000 UNIT) CAPS capsule Take 1 capsule (50,000 Units total) by mouth every 7 (seven) days. 01/24/22   Larae Grooms, NP  witch hazel-glycerin (TUCKS) pad Apply 1 Application topically as needed for itching. 12/10/21   Sharman Cheek, MD    Allergies as of 12/31/2021 - Review Complete 12/31/2021  Allergen Reaction Noted   Crestor [rosuvastatin] Other (See Comments) 09/02/2021   Doxycycline Diarrhea and  Nausea And Vomiting 03/11/2014   Nsaids Other (See Comments) 03/11/2014   Other Other (See Comments) 09/26/2019   Tolmetin  09/08/2015   Ace inhibitors Other (See Comments) and Rash 03/08/2016   Aspirin Rash 02/23/2017    Family History  Problem Relation Age of Onset   Alcohol abuse Mother    Hyperlipidemia Mother    Heart disease Mother    Hypertension Mother    Heart attack Mother    Alcohol abuse Father    Hyperlipidemia Father    Heart disease Father    Hypertension Father    Heart attack Father    Alcohol abuse Maternal Aunt    Hypertension Maternal Aunt    Kidney  disease Maternal Aunt    Alcohol abuse Maternal Uncle    Hypertension Maternal Uncle    Alcohol abuse Paternal Aunt    Birth defects Paternal Aunt        Colon, Breast, Lung   Heart disease Paternal Aunt    Breast cancer Paternal Aunt    Alcohol abuse Paternal Uncle    Heart disease Paternal Uncle    Stroke Maternal Grandmother    Hypertension Maternal Grandmother    Alcohol abuse Maternal Grandfather    Heart disease Maternal Grandfather    Hypertension Maternal Grandfather     Social History   Socioeconomic History   Marital status: Married    Spouse name: Not on file   Number of children: Not on file   Years of education: Not on file   Highest education level: Not on file  Occupational History   Not on file  Tobacco Use   Smoking status: Former    Packs/day: 0.50    Years: 15.00    Total pack years: 7.50    Types: Cigarettes    Quit date: 06/12/2005    Years since quitting: 16.6   Smokeless tobacco: Never   Tobacco comments:    quit 2007  Vaping Use   Vaping Use: Never used  Substance and Sexual Activity   Alcohol use: Not Currently    Alcohol/week: 0.0 standard drinks of alcohol   Drug use: No   Sexual activity: Not Currently  Other Topics Concern   Not on file  Social History Narrative   Not on file   Social Determinants of Health   Financial Resource Strain: Low Risk  (01/17/2022)   Overall Financial Resource Strain (CARDIA)    Difficulty of Paying Living Expenses: Not hard at all  Food Insecurity: No Food Insecurity (06/15/2021)   Hunger Vital Sign    Worried About Running Out of Food in the Last Year: Never true    Ran Out of Food in the Last Year: Never true  Transportation Needs: No Transportation Needs (01/17/2022)   PRAPARE - Administrator, Civil Service (Medical): No    Lack of Transportation (Non-Medical): No  Physical Activity: Sufficiently Active (06/15/2021)   Exercise Vital Sign    Days of Exercise per Week: 5 days    Minutes of  Exercise per Session: 30 min  Stress: No Stress Concern Present (06/15/2021)   [employer] of Occupational Health - Occupational Stress Questionnaire    Feeling of Stress : Not at all  Social Connections: Moderately Integrated (06/15/2021)   Social Connection and Isolation Panel [NHANES]    Frequency of Communication with Friends and Family: More than three times a week    Frequency of Social Gatherings with Friends and Family: Twice a week  Attends Religious Services: Never    Active Member of Clubs or Organizations: Yes    Attends Archivist Meetings: 1 to 4 times per year    Marital Status: Married  Human resources officer Violence: Not At Risk (06/15/2021)   Humiliation, Afraid, Rape, and Kick questionnaire    Fear of Current or Ex-Partner: No    Emotionally Abused: No    Physically Abused: No    Sexually Abused: No    Review of Systems: See HPI, otherwise negative ROS  Physical Exam: BP 137/82   Pulse (!) 58   Temp (!) 96.5 F (35.8 C)   Resp 18   Ht 5\' 6"  (1.676 m)   Wt 87.1 kg   LMP  (LMP Unknown)   SpO2 100%   BMI 30.99 kg/m  General:   Alert,  pleasant and cooperative in NAD Head:  Normocephalic and atraumatic. Neck:  Supple; no masses or thyromegaly. Lungs:  Clear throughout to auscultation, normal respiratory effort.    Heart:  +S1, +S2, Regular rate and rhythm, No edema. Abdomen:  Soft, nontender and nondistended. Normal bowel sounds, without guarding, and without rebound.   Neurologic:  Alert and  oriented x4;  grossly normal neurologically.  Impression/Plan: Hannah Calhoun is here for an colonoscopy to be performed for rectal bleeding .  Risks, benefits, limitations, and alternatives regarding  colonoscopy have been reviewed with the patient.  Questions have been answered.  All parties agreeable.   Jonathon Bellows, MD  02/04/2022, 8:05 AM

## 2022-02-04 NOTE — Transfer of Care (Signed)
Immediate Anesthesia Transfer of Care Note  Patient: Hannah Calhoun  Procedure(s) Performed: COLONOSCOPY WITH PROPOFOL  Patient Location: PACU and Endoscopy Unit  Anesthesia Type:General  Level of Consciousness: drowsy  Airway & Oxygen Therapy: Patient Spontanous Breathing  Post-op Assessment: Report given to RN and Post -op Vital signs reviewed and stable  Post vital signs: Reviewed and stable  Last Vitals:  Vitals Value Taken Time  BP 107/61 02/04/22 0826  Temp    Pulse 60 02/04/22 0826  Resp 23 02/04/22 0826  SpO2 100 % 02/04/22 0826  Vitals shown include unvalidated device data.  Last Pain:  Vitals:   02/04/22 0713  PainSc: 0-No pain         Complications: No notable events documented.

## 2022-02-05 DIAGNOSIS — E785 Hyperlipidemia, unspecified: Secondary | ICD-10-CM

## 2022-02-05 DIAGNOSIS — Z7984 Long term (current) use of oral hypoglycemic drugs: Secondary | ICD-10-CM | POA: Diagnosis not present

## 2022-02-05 DIAGNOSIS — E1159 Type 2 diabetes mellitus with other circulatory complications: Secondary | ICD-10-CM | POA: Diagnosis not present

## 2022-02-05 DIAGNOSIS — I10 Essential (primary) hypertension: Secondary | ICD-10-CM | POA: Diagnosis not present

## 2022-02-07 ENCOUNTER — Encounter: Payer: Self-pay | Admitting: Gastroenterology

## 2022-02-15 ENCOUNTER — Telehealth: Payer: Self-pay | Admitting: Nurse Practitioner

## 2022-02-15 DIAGNOSIS — Z111 Encounter for screening for respiratory tuberculosis: Secondary | ICD-10-CM

## 2022-02-15 NOTE — Telephone Encounter (Signed)
Copied from [place] [phone] [phone]. Topic: Appointment Scheduling - Scheduling Inquiry for Clinic >> Feb 15, 2022  2:18 PM Leilani Able wrote: Reason for CRM: Pt is interested in sching for a TB skin test fu to advise at [phone]

## 2022-02-16 ENCOUNTER — Other Ambulatory Visit: Payer: HMO

## 2022-02-16 DIAGNOSIS — Z111 Encounter for screening for respiratory tuberculosis: Secondary | ICD-10-CM

## 2022-02-16 NOTE — Telephone Encounter (Signed)
There is no form to be filled out, she just needs the TB quantiferon test for her work. the order is all she needs.  Thanks!

## 2022-02-16 NOTE — Telephone Encounter (Signed)
Lab appt scheduled for 10-11

## 2022-02-16 NOTE — Telephone Encounter (Signed)
[facility] 

## 2022-02-16 NOTE — Addendum Note (Signed)
Addended by: Jon Billings on: 02/16/2022 12:43 PM   Modules accepted: Orders

## 2022-02-16 NOTE — Telephone Encounter (Signed)
Patient states she needs the tb test for pre employment.

## 2022-02-16 NOTE — Telephone Encounter (Signed)
We don't do the TB skin test.  Why does patient need the screening?

## 2022-02-16 NOTE — Telephone Encounter (Signed)
If there is a form that needs to be filled out she needs an appt.  If it is just for the tb test I will place the order.

## 2022-02-19 LAB — QUANTIFERON-TB GOLD PLUS
QuantiFERON Mitogen Value: >10 IU/mL
QuantiFERON Nil Value: 0.15 IU/mL
QuantiFERON TB1 Ag Value: 0.08 IU/mL
QuantiFERON TB2 Ag Value: 0.09 IU/mL
QuantiFERON-TB Gold Plus: NEGATIVE

## 2022-02-21 NOTE — Progress Notes (Signed)
Hi Hannah Calhoun.  Your TB test was negative.

## 2022-02-23 ENCOUNTER — Ambulatory Visit (INDEPENDENT_AMBULATORY_CARE_PROVIDER_SITE_OTHER): Payer: HMO | Admitting: Nurse Practitioner

## 2022-02-23 ENCOUNTER — Encounter: Payer: Self-pay | Admitting: Nurse Practitioner

## 2022-02-23 VITALS — BP 120/71 | HR 82 | Temp 98.7°F | Wt 195.2 lb

## 2022-02-23 DIAGNOSIS — Z7984 Long term (current) use of oral hypoglycemic drugs: Secondary | ICD-10-CM

## 2022-02-23 DIAGNOSIS — E1159 Type 2 diabetes mellitus with other circulatory complications: Secondary | ICD-10-CM

## 2022-02-23 DIAGNOSIS — J069 Acute upper respiratory infection, unspecified: Secondary | ICD-10-CM

## 2022-02-23 DIAGNOSIS — E1169 Type 2 diabetes mellitus with other specified complication: Secondary | ICD-10-CM | POA: Diagnosis not present

## 2022-02-23 DIAGNOSIS — I152 Hypertension secondary to endocrine disorders: Secondary | ICD-10-CM

## 2022-02-23 MED ORDER — METHYLPREDNISOLONE 4 MG PO TBPK: ORAL_TABLET | ORAL | 0 refills | Status: DC

## 2022-02-23 MED ORDER — AZITHROMYCIN 250 MG PO TABS: ORAL_TABLET | ORAL | 0 refills | Status: AC

## 2022-02-23 NOTE — Progress Notes (Signed)
BP 120/71   Pulse 82   Temp 98.7 F (37.1 C) (Oral)   Wt 195 lb 3.2 oz (88.5 kg)   LMP  (LMP Unknown)   SpO2 97%   BMI 31.51 kg/m    Subjective:    Patient ID: Hannah Calhoun, female    DOB: 08/15/1960, 61 y.o.   MRN: [id number]  HPI: Hannah Calhoun is a 61 y.o. female  Chief Complaint  Patient presents with   Allergies    Post nasal drip, would like to discuss other options for seasonal allergies    Diabetic Eye Exam    Req eye exam from [place], [place] .    HYPERTENSION without Chronic Kidney Disease Hypertension status: controlled Satisfied with current treatment? no Duration of hypertension: years BP monitoring frequency:  weekly BP range: 120-130/80 BP medication side effects:  no Medication compliance: excellent compliance Previous BP meds: Metoprolol, amlodipine, and HCTZ Aspirin: no Recurrent headaches: no Visual changes: no Palpitations: no Dyspnea: no Chest pain: no Lower extremity edema: no Dizzy/lightheaded: no  Relevant past medical, surgical, family and social history reviewed and updated as indicated. Interim medical history since our last visit reviewed. Allergies and medications reviewed and updated.  Review of Systems  HENT:  Positive for congestion.   Eyes:  Negative for visual disturbance.  Respiratory:  Positive for cough. Negative for chest tightness and shortness of breath.   Cardiovascular:  Negative for chest pain, palpitations and leg swelling.  Neurological:  Negative for dizziness and headaches.    Per HPI unless specifically indicated above     Objective:    BP 120/71   Pulse 82   Temp 98.7 F (37.1 C) (Oral)   Wt 195 lb 3.2 oz (88.5 kg)   LMP  (LMP Unknown)   SpO2 97%   BMI 31.51 kg/m   Wt Readings from Last 3 Encounters:  02/23/22 195 lb 3.2 oz (88.5 kg)  02/04/22 192 lb (87.1 kg)  01/24/22 197 lb 11.2 oz (89.7 kg)    Physical Exam Vitals and nursing note reviewed.  Constitutional:       General: She is not in acute distress.    Appearance: Normal appearance. She is normal weight. She is not ill-appearing, toxic-appearing or diaphoretic.  HENT:     Head: Normocephalic.     Right Ear: External ear normal.     Left Ear: External ear normal.     Nose: Nose normal.     Mouth/Throat:     Mouth: Mucous membranes are moist.     Pharynx: Oropharynx is clear.  Eyes:     General:        Right eye: No discharge.        Left eye: No discharge.     Extraocular Movements: Extraocular movements intact.     Conjunctiva/sclera: Conjunctivae normal.     Pupils: Pupils are equal, round, and reactive to light.  Cardiovascular:     Rate and Rhythm: Normal rate and regular rhythm.     Heart sounds: No murmur heard. Pulmonary:     Effort: Pulmonary effort is normal. No respiratory distress.     Breath sounds: Wheezing present. No rales.  Musculoskeletal:     Cervical back: Normal range of motion and neck supple.  Skin:    General: Skin is warm and dry.     Capillary Refill: Capillary refill takes less than 2 seconds.  Neurological:     General: No focal deficit present.  Mental Status: She is alert and oriented to person, place, and time. Mental status is at baseline.  Psychiatric:        Mood and Affect: Mood normal.        Behavior: Behavior normal.        Thought Content: Thought content normal.        Judgment: Judgment normal.     Results for orders placed or performed in visit on 02/16/22  QuantiFERON-TB Gold Plus  Result Value Ref Range   QuantiFERON Incubation Incubation performed.    QuantiFERON Criteria Comment    QuantiFERON TB1 Ag Value 0.08 IU/mL   QuantiFERON TB2 Ag Value 0.09 IU/mL   QuantiFERON Nil Value 0.15 IU/mL   QuantiFERON Mitogen Value >10.00 IU/mL   QuantiFERON-TB Gold Plus Negative Negative      Assessment & Plan:   Problem List Items Addressed This Visit       Cardiovascular and Mediastinum   Hypertension associated with diabetes ([facility])     Chronic.  Controlled. Continue with amlodipine, HCTZ, metoprolol and Valsartan 40mg  daily.  Side effects and benefits discussed during visit.  Follow up in 3 month for reevaluation.  Continue to check blood pressures at home.      Other Visit Diagnoses     Viral upper respiratory tract infection    -  Primary   Wheezing on exam. Will treat with Prednisone and Azithromycin. FU if not improved.    Relevant Medications   azithromycin (ZITHROMAX) 250 MG tablet        Follow up plan: Return for [facility] appt in November.

## 2022-02-23 NOTE — Assessment & Plan Note (Signed)
Chronic.  Controlled. Continue with amlodipine, HCTZ, metoprolol and Valsartan 40mg  daily.  Side effects and benefits discussed during visit.  Follow up in 3 month for reevaluation.  Continue to check blood pressures at home.

## 2022-02-24 NOTE — Assessment & Plan Note (Signed)
Chronic, stable. Labs ordered today. Continue current regimen of Glipizide 5mg  daily.  A1c controlled at 6.0.  Patient would rather work on diet than add medication.  Will send to diabetes education if A1c is elevated.  Follow up in 6 months.  Call sooner if concerns arise.

## 2022-03-01 ENCOUNTER — Other Ambulatory Visit: Payer: Medicare (Managed Care)

## 2022-03-25 ENCOUNTER — Telehealth: Payer: Self-pay

## 2022-03-25 NOTE — Progress Notes (Signed)
    Chronic Care Management Pharmacy Assistant   Name: Hannah Calhoun  MRN: [id number] DOB: 05-Nov-1960  Patient assistance application for Ozempic was completed on behalf of the patient.  Patient stated that she is no longer taking Ozempic due to side effects. Patient name will be removed from Patient Assistance List.    Medications: Outpatient Encounter Medications as of 03/25/2022  Medication Sig   acetaminophen (TYLENOL) 500 MG tablet Take 650 mg by mouth 2 (two) times daily.   amLODipine (NORVASC) 10 MG tablet Take 1 tablet (10 mg total) by mouth daily.   Cholecalciferol 25 MCG (1000 UT) capsule Take 1,000 Units by mouth daily.   Continuous Blood Gluc Receiver (FREESTYLE LIBRE 2 READER) DEVI 1 Device by Does not apply route continuous.   Continuous Blood Gluc Sensor (FREESTYLE LIBRE 2 SENSOR) MISC 1 Application by Does not apply route once a week.   fluticasone (FLONASE) 50 MCG/ACT nasal spray PLACE 1 SPRAY INTO BOTH NOSTRILS DAILY. (Patient not taking: Reported on 01/24/2022)   gabapentin (NEURONTIN) 300 MG capsule TAKE 1 CAPSULE BY MOUTH THREE TIMES A DAY   GARLIC PO Take by mouth daily.   glipiZIDE (GLUCOTROL) 5 MG tablet TAKE 1 TABLET BY MOUTH TWICE A DAY BEFORE A MEAL-NO MORE REFILLS WITHOUT OFFICE VISIT   hydrochlorothiazide (HYDRODIURIL) 25 MG tablet Take 1 tablet (25 mg total) by mouth daily.   KLOR-CON M20 20 MEQ tablet TAKE 1 TABLET BY MOUTH TWICE A DAY   methylPREDNISolone (MEDROL DOSEPAK) 4 MG TBPK tablet Take as directed   metoprolol succinate (TOPROL-XL) 25 MG 24 hr tablet Take 1 tablet (25 mg total) by mouth daily.   Multiple Vitamin (MULTI-VITAMIN) tablet Take 1 tablet by mouth daily.   valsartan (DIOVAN) 40 MG tablet Take 1 tablet (40 mg total) by mouth daily.   Vitamin D, Ergocalciferol, (DRISDOL) 1.25 MG (50000 UNIT) CAPS capsule Take 1 capsule (50,000 Units total) by mouth every 7 (seven) days.   witch hazel-glycerin (TUCKS) pad Apply 1 Application topically  as needed for itching. (Patient not taking: Reported on 02/23/2022)   No facility-administered encounter medications on file as of 03/25/2022.   [facility] Concierge [phone]

## 2022-04-04 DIAGNOSIS — T380X5A Adverse effect of glucocorticoids and synthetic analogues, initial encounter: Secondary | ICD-10-CM | POA: Insufficient documentation

## 2022-04-04 NOTE — Progress Notes (Unsigned)
LMP  (LMP Unknown)    Subjective:    Patient ID: Hannah Calhoun, female    DOB: Aug 02, 1960, 62 y.o.   MRN: [id number]  HPI: Hannah Calhoun is a 61 y.o. female  No chief complaint on file.  HYPERTENSION / HYPERLIPIDEMIA Satisfied with current treatment? no Duration of hypertension: years BP monitoring frequency:  occassionaly BP range: 130/70 BP medication side effects: no Past BP meds:  metoprolol, amlodipine and HCTZ Duration of hyperlipidemia: years Cholesterol medication side effects: no Cholesterol supplements: none Past cholesterol medications:no taking Medication compliance: excellent compliance Aspirin: no Recent stressors: no Recurrent headaches: no Visual changes: no Palpitations: no Dyspnea: no Chest pain: no Lower extremity edema: no Dizzy/lightheaded: no  The 10-year ASCVD risk score (Arnett DK, et al., 2019) is: 13.1%   Values used to calculate the score:     Age: 19 years     Sex: Female     Is Non-Hispanic African American: Yes     Diabetic: Yes     Tobacco smoker: No     Systolic Blood Pressure: 120 mmHg     Is BP treated: Yes     HDL Cholesterol: 57 mg/dL     Total Cholesterol: 242 mg/dL   DIABETES Hypoglycemic episodes:no Polydipsia/polyuria: no Visual disturbance: no Chest pain: no Paresthesias: yes Glucose Monitoring: yes  Accucheck frequency: Daily  Fasting glucose: 120-130  Post prandial:  Evening:  Before meals: Taking Insulin?: no  Long acting insulin:  Short acting insulin: Blood Pressure Monitoring: weekly Retinal Examination: Up to Date Foot Exam: Up to Date Diabetic Education: Not Completed Pneumovax: Up to Date Influenza: Up to Date Aspirin: no  ANXIETY/DEPRESSION Patient feels like her mood is "not bad".  Feels like she is doing pretty well.  Denies concerns about medications.  Denies SI.     Relevant past medical, surgical, family and social history reviewed and updated as indicated. Interim  medical history since our last visit reviewed. Allergies and medications reviewed and updated.  Review of Systems  Eyes:  Negative for visual disturbance.  Respiratory:  Negative for cough, chest tightness and shortness of breath.   Cardiovascular:  Negative for chest pain, palpitations and leg swelling.  Musculoskeletal:        Leg cramps  Neurological:  Negative for dizziness and headaches.  Psychiatric/Behavioral:  Negative for dysphoric mood and suicidal ideas. The patient is not nervous/anxious.     Per HPI unless specifically indicated above     Objective:    LMP  (LMP Unknown)   Wt Readings from Last 3 Encounters:  02/23/22 195 lb 3.2 oz (88.5 kg)  02/04/22 192 lb (87.1 kg)  01/24/22 197 lb 11.2 oz (89.7 kg)    Physical Exam Vitals and nursing note reviewed.  Constitutional:      General: She is not in acute distress.    Appearance: Normal appearance. She is normal weight. She is not ill-appearing, toxic-appearing or diaphoretic.  HENT:     Head: Normocephalic.     Right Ear: External ear normal.     Left Ear: External ear normal.     Nose: Nose normal.     Mouth/Throat:     Mouth: Mucous membranes are moist.     Pharynx: Oropharynx is clear.  Eyes:     General:        Right eye: No discharge.        Left eye: No discharge.     Extraocular Movements: Extraocular movements intact.  Conjunctiva/sclera: Conjunctivae normal.     Pupils: Pupils are equal, round, and reactive to light.  Cardiovascular:     Rate and Rhythm: Normal rate and regular rhythm.     Heart sounds: No murmur heard. Pulmonary:     Effort: Pulmonary effort is normal. No respiratory distress.     Breath sounds: Normal breath sounds. No wheezing or rales.  Musculoskeletal:     Cervical back: Normal range of motion and neck supple.  Skin:    General: Skin is warm and dry.     Capillary Refill: Capillary refill takes less than 2 seconds.  Neurological:     General: No focal deficit  present.     Mental Status: She is alert and oriented to person, place, and time. Mental status is at baseline.  Psychiatric:        Mood and Affect: Mood normal.        Behavior: Behavior normal.        Thought Content: Thought content normal.        Judgment: Judgment normal.     Results for orders placed or performed in visit on 02/16/22  QuantiFERON-TB Gold Plus  Result Value Ref Range   QuantiFERON Incubation Incubation performed.    QuantiFERON Criteria Comment    QuantiFERON TB1 Ag Value 0.08 IU/mL   QuantiFERON TB2 Ag Value 0.09 IU/mL   QuantiFERON Nil Value 0.15 IU/mL   QuantiFERON Mitogen Value >10.00 IU/mL   QuantiFERON-TB Gold Plus Negative Negative      Assessment & Plan:   Problem List Items Addressed This Visit       Cardiovascular and Mediastinum   Hypertension associated with diabetes (H[facility]) - Primary     Endocrine   Type 2 diabetes mellitus with other specified complication (HCC)   Hyperlipidemia associated with type 2 diabetes mellitus (HCC)     Other   Obesity (BMI 30-39.9)   Anxiety and depression   Vitamin D deficiency     Follow up plan: No follow-ups on file.

## 2022-04-05 ENCOUNTER — Ambulatory Visit (INDEPENDENT_AMBULATORY_CARE_PROVIDER_SITE_OTHER): Payer: HMO | Admitting: Nurse Practitioner

## 2022-04-05 ENCOUNTER — Encounter: Payer: Self-pay | Admitting: Nurse Practitioner

## 2022-04-05 VITALS — BP 111/61 | HR 60 | Temp 98.5°F | Ht 66.0 in | Wt 197.5 lb

## 2022-04-05 DIAGNOSIS — E1169 Type 2 diabetes mellitus with other specified complication: Secondary | ICD-10-CM | POA: Diagnosis not present

## 2022-04-05 DIAGNOSIS — E785 Hyperlipidemia, unspecified: Secondary | ICD-10-CM

## 2022-04-05 DIAGNOSIS — F419 Anxiety disorder, unspecified: Secondary | ICD-10-CM

## 2022-04-05 DIAGNOSIS — G720 Drug-induced myopathy: Secondary | ICD-10-CM

## 2022-04-05 DIAGNOSIS — Z6835 Body mass index (BMI) 35.0-35.9, adult: Secondary | ICD-10-CM

## 2022-04-05 DIAGNOSIS — R21 Rash and other nonspecific skin eruption: Secondary | ICD-10-CM

## 2022-04-05 DIAGNOSIS — J683 Other acute and subacute respiratory conditions due to chemicals, gases, fumes and vapors: Secondary | ICD-10-CM | POA: Insufficient documentation

## 2022-04-05 DIAGNOSIS — E559 Vitamin D deficiency, unspecified: Secondary | ICD-10-CM

## 2022-04-05 DIAGNOSIS — T380X5A Adverse effect of glucocorticoids and synthetic analogues, initial encounter: Secondary | ICD-10-CM

## 2022-04-05 DIAGNOSIS — I152 Hypertension secondary to endocrine disorders: Secondary | ICD-10-CM

## 2022-04-05 DIAGNOSIS — E1159 Type 2 diabetes mellitus with other circulatory complications: Secondary | ICD-10-CM

## 2022-04-05 DIAGNOSIS — F32A Depression, unspecified: Secondary | ICD-10-CM

## 2022-04-05 DIAGNOSIS — E669 Obesity, unspecified: Secondary | ICD-10-CM

## 2022-04-05 MED ORDER — TRIAMCINOLONE ACETONIDE 0.1 % EX CREA: 1.0000 | TOPICAL_CREAM | Freq: Two times a day (BID) | CUTANEOUS | 0 refills | Status: DC

## 2022-04-05 NOTE — Assessment & Plan Note (Signed)
Not able to tolerate statins due to leg cramps.  Tried Simvastatin and Crestor.

## 2022-04-05 NOTE — Assessment & Plan Note (Signed)
Chronic.  Simvastatin was causing her leg cramps.  She is no longer taking the medication.  Labs ordered today.  Return to clinic in 6 months for reevaluation.  Call sooner if concerns arise.

## 2022-04-05 NOTE — Assessment & Plan Note (Deleted)
BMI 31. Recommended eating smaller high protein, low fat meals more frequently and exercising 30 mins a day 5 times a week with a goal of 10-15lb weight loss in the next 3 months. Patient voiced their understanding and motivation to adhere to these recommendations.

## 2022-04-05 NOTE — Assessment & Plan Note (Signed)
Chronic.  Controlled without medication..  Labs ordered today.  Return to clinic in 6 months for reevaluation.  Call sooner if concerns arise.  ° °

## 2022-04-05 NOTE — Assessment & Plan Note (Signed)
BMI 31. Recommended eating smaller high protein, low fat meals more frequently and exercising 30 mins a day 5 times a week with a goal of 10-15lb weight loss in the next 3 months. Patient voiced their understanding and motivation to adhere to these recommendations.  

## 2022-04-05 NOTE — Assessment & Plan Note (Signed)
Labs ordered at visit today.  Will make recommendations based on lab results.   

## 2022-04-05 NOTE — Assessment & Plan Note (Signed)
Chronic.  Controlled. Continue with amlodipine, HCTZ, metoprolol and Valsartan 40mg  daily.  Labs ordered today.  Follow up in 6 months for reevaluation.  Continue to check blood pressures at home.  Call sooner if concerns arise.

## 2022-04-05 NOTE — Assessment & Plan Note (Signed)
Chronic, stable. Labs ordered today. Continue current regimen of Glipizide 5mg  daily.  A1c controlled at 6.0.  Patient would rather work on diet than add medication.  Continue with diet changes.  Follow up in 6 months.  Call sooner if concerns arise.

## 2022-04-06 ENCOUNTER — Encounter: Payer: Self-pay | Admitting: Nurse Practitioner

## 2022-04-06 LAB — COMPREHENSIVE METABOLIC PANEL
ALT: 19 IU/L (ref 0–32)
AST: 18 IU/L (ref 0–40)
Albumin/Globulin Ratio: 1.8 (ref 1.2–2.2)
Albumin: 4.4 g/dL (ref 3.8–4.9)
Alkaline Phosphatase: 59 IU/L (ref 44–121)
BUN/Creatinine Ratio: 15 (ref 9–23)
BUN: 12 mg/dL (ref 6–24)
Bilirubin Total: 0.3 mg/dL (ref 0.0–1.2)
CO2: 23 mmol/L (ref 20–29)
Calcium: 9.5 mg/dL (ref 8.7–10.2)
Chloride: 104 mmol/L (ref 96–106)
Creatinine, Ser: 0.78 mg/dL (ref 0.57–1.00)
Globulin, Total: 2.4 g/dL (ref 1.5–4.5)
Glucose: 109 mg/dL — ABNORMAL HIGH (ref 70–99)
Potassium: 3.8 mmol/L (ref 3.5–5.2)
Sodium: 142 mmol/L (ref 134–144)
Total Protein: 6.8 g/dL (ref 6.0–8.5)
eGFR: 88 mL/min/{1.73_m2} (ref >59)

## 2022-04-06 LAB — LIPID PANEL
Chol/HDL Ratio: 3.7 ratio (ref 0.0–4.4)
Cholesterol, Total: 237 mg/dL — ABNORMAL HIGH (ref 100–199)
HDL: 64 mg/dL (ref >39)
LDL Chol Calc (NIH): 154 mg/dL — ABNORMAL HIGH (ref 0–99)
Triglycerides: 110 mg/dL (ref 0–149)
VLDL Cholesterol Cal: 19 mg/dL (ref 5–40)

## 2022-04-06 LAB — VITAMIN D 25 HYDROXY (VIT D DEFICIENCY, FRACTURES): Vit D, 25-Hydroxy: 49.5 ng/mL (ref 30.0–100.0)

## 2022-04-06 LAB — HEMOGLOBIN A1C
Est. average glucose Bld gHb Est-mCnc: 154 mg/dL
Hgb A1c MFr Bld: 7 % — ABNORMAL HIGH (ref 4.8–5.6)

## 2022-04-06 NOTE — Progress Notes (Signed)
Hi Ms. Hannah Calhoun. It was nice to see you yesterday.  Your lab work looks good.  Your A1c did increase to 7.0.  This is still well controlled.  However, I don't want her to go up any more.  She has two choices. 1. She can work on diet and exercise.  Or 2. She can add a medication called Jardiance to help control her A1c.  Her vitamin D is within normal range.  No other concerns at this time. Continue with your current medication regimen.  Follow up as discussed.  Please let me know if you have any questions.

## 2022-04-18 ENCOUNTER — Telehealth: Payer: Self-pay

## 2022-04-18 NOTE — Progress Notes (Signed)
    Chronic Care Management Pharmacy Assistant    Duplicate

## 2022-06-02 DIAGNOSIS — E113393 Type 2 diabetes mellitus with moderate nonproliferative diabetic retinopathy without macular edema, bilateral: Secondary | ICD-10-CM | POA: Diagnosis not present

## 2022-06-27 ENCOUNTER — Ambulatory Visit (INDEPENDENT_AMBULATORY_CARE_PROVIDER_SITE_OTHER): Payer: PPO

## 2022-06-27 VITALS — Ht 66.0 in | Wt 197.0 lb

## 2022-06-27 DIAGNOSIS — Z0000 Encounter for general adult medical examination without abnormal findings: Secondary | ICD-10-CM

## 2022-06-27 NOTE — Progress Notes (Signed)
I connected with  Hannah Calhoun on 06/27/22 by a audio enabled telemedicine application and verified that I am speaking with the correct person using two identifiers.  Patient Location: Home  Provider Location: Home Office  I discussed the limitations of evaluation and management by telemedicine. The patient expressed understanding and agreed to proceed.  Subjective:   Hannah Calhoun is a 62 y.o. female who presents for Medicare Annual (Subsequent) preventive examination.  Review of Systems     Cardiac Risk Factors include: advanced age (>69mn, >>41women);diabetes mellitus;hypertension;obesity (BMI >30kg/m2)     Objective:    Today's Vitals   06/27/22 1132  PainSc: 1    There is no height or weight on file to calculate BMI.     06/27/2022   11:38 AM 02/04/2022    7:12 AM 12/10/2021    3:53 PM 06/15/2021   12:07 PM 04/03/2019    7:55 PM 08/31/2015    4:55 PM 06/29/2015   12:09 PM  Advanced Directives  Does Patient Have a Medical Advance Directive? No No No No No No No  Would patient like information on creating a medical advance directive? No - Patient declined   No - Patient declined   No - patient declined information    Current Medications (verified) Outpatient Encounter Medications as of 06/27/2022  Medication Sig   acetaminophen (TYLENOL) 500 MG tablet Take 650 mg by mouth 2 (two) times daily.   amLODipine (NORVASC) 10 MG tablet Take 1 tablet (10 mg total) by mouth daily.   Continuous Blood Gluc Sensor (FREESTYLE LIBRE 2 SENSOR) MISC 1 Application by Does not apply route once a week.   gabapentin (NEURONTIN) 300 MG capsule TAKE 1 CAPSULE BY MOUTH THREE TIMES A DAY   GARLIC PO Take by mouth daily.   glipiZIDE (GLUCOTROL) 5 MG tablet TAKE 1 TABLET BY MOUTH TWICE A DAY BEFORE A MEAL-NO MORE REFILLS WITHOUT OFFICE VISIT   hydrochlorothiazide (HYDRODIURIL) 25 MG tablet Take 1 tablet (25 mg total) by mouth daily.   KLOR-CON M20 20 MEQ tablet TAKE 1 TABLET BY  MOUTH TWICE A DAY   metoprolol succinate (TOPROL-XL) 25 MG 24 hr tablet Take 1 tablet (25 mg total) by mouth daily.   Multiple Vitamin (MULTI-VITAMIN) tablet Take 1 tablet by mouth daily.   triamcinolone cream (KENALOG) 0.1 % Apply 1 Application topically 2 (two) times daily.   valsartan (DIOVAN) 40 MG tablet Take 1 tablet (40 mg total) by mouth daily.   Vitamin D, Ergocalciferol, (DRISDOL) 1.25 MG (50000 UNIT) CAPS capsule Take 1 capsule (50,000 Units total) by mouth every 7 (seven) days.   [DISCONTINUED] Continuous Blood Gluc Receiver (FREESTYLE LIBRE 2 READER) DEVI 1 Device by Does not apply route continuous.   [DISCONTINUED] methylPREDNISolone (MEDROL DOSEPAK) 4 MG TBPK tablet Take as directed   No facility-administered encounter medications on file as of 06/27/2022.    Allergies (verified) Crestor [rosuvastatin], Doxycycline, Nsaids, Other, Tolmetin, Ace inhibitors, and Aspirin   History: Past Medical History:  Diagnosis Date   Allergy    Arthritis    Chicken pox    Depression    Diabetes mellitus without complication (HOdin    Diverticulitis    GERD (gastroesophageal reflux disease)    History of colon polyps    Hyperlipidemia    Hypertension    Past Surgical History:  Procedure Laterality Date   BREAST BIOPSY Right 1990   EXCISIONAL - NEG   BREAST SURGERY Right 1990   CHOLECYSTECTOMY  COLONOSCOPY WITH PROPOFOL N/A 02/04/2022   Procedure: COLONOSCOPY WITH PROPOFOL;  Surgeon: Jonathon Bellows, MD;  Location: [facility] ENDOSCOPY;  Service: Gastroenterology;  Laterality: N/A;   JOINT REPLACEMENT     KNEE ARTHROSCOPY  2010   NASAL SEPTUM SURGERY  2010   TONSILLECTOMY     TOTAL ABDOMINAL HYSTERECTOMY  2009   TOTAL HIP ARTHROPLASTY     Family History  Problem Relation Age of Onset   Alcohol abuse Mother    Hyperlipidemia Mother    Heart disease Mother    Hypertension Mother    Heart attack Mother    Alcohol abuse Father    Hyperlipidemia Father    Heart disease Father     Hypertension Father    Heart attack Father    Alcohol abuse Maternal Aunt    Hypertension Maternal Aunt    Kidney disease Maternal Aunt    Alcohol abuse Maternal Uncle    Hypertension Maternal Uncle    Alcohol abuse Paternal Aunt    Birth defects Paternal Aunt        Colon, Breast, Lung   Heart disease Paternal Aunt    Breast cancer Paternal Aunt    Alcohol abuse Paternal Uncle    Heart disease Paternal Uncle    Stroke Maternal Grandmother    Hypertension Maternal Grandmother    Alcohol abuse Maternal Grandfather    Heart disease Maternal Grandfather    Hypertension Maternal Grandfather    Social History   Socioeconomic History   Marital status: Married    Spouse name: Not on file   Number of children: Not on file   Years of education: Not on file   Highest education level: Not on file  Occupational History   Not on file  Tobacco Use   Smoking status: Former    Packs/day: 0.50    Years: 15.00    Total pack years: 7.50    Types: Cigarettes    Quit date: 06/12/2005    Years since quitting: 17.0   Smokeless tobacco: Never   Tobacco comments:    quit 2007  Vaping Use   Vaping Use: Never used  Substance and Sexual Activity   Alcohol use: Not Currently    Alcohol/week: 0.0 standard drinks of alcohol   Drug use: No   Sexual activity: Not Currently  Other Topics Concern   Not on file  Social History Narrative   Not on file   Social Determinants of Health   Financial Resource Strain: Low Risk  (06/27/2022)   Overall Financial Resource Strain (CARDIA)    Difficulty of Paying Living Expenses: Not hard at all  Food Insecurity: No Food Insecurity (06/27/2022)   Hunger Vital Sign    Worried About Running Out of Food in the Last Year: Never true    Ran Out of Food in the Last Year: Never true  Transportation Needs: No Transportation Needs (06/27/2022)   PRAPARE - Hydrologist (Medical): No    Lack of Transportation (Non-Medical): No  Physical  Activity: Insufficiently Active (06/27/2022)   Exercise Vital Sign    Days of Exercise per Week: 3 days    Minutes of Exercise per Session: 30 min  Stress: No Stress Concern Present (06/27/2022)   [place]    Feeling of Stress : Only a little  Social Connections: Moderately Integrated (06/27/2022)   Social Connection and Isolation Panel [NHANES]    Frequency of Communication with Friends and  Family: More than three times a week    Frequency of Social Gatherings with Friends and Family: More than three times a week    Attends Religious Services: Never    Marine scientist or Organizations: Yes    Attends Music therapist: More than 4 times per year    Marital Status: Married    Tobacco Counseling Counseling given: Not Answered Tobacco comments: quit 2007   Clinical Intake:  Pre-visit preparation completed: Yes  Pain : 0-10 Pain Score: 1  Pain Type: Chronic pain Pain Location: Knee Pain Orientation: Right, Left     Diabetes: Yes CBG done?: No Did pt. bring in CBG monitor from home?: No  How often do you need to have someone help you when you read instructions, pamphlets, or other written materials from your doctor or pharmacy?: 1 - Never  Diabetic?yes Nutrition Risk Assessment:  Has the patient had any N/V/D within the last 2 months?  Yes  Does the patient have any non-healing wounds?  Yes  Has the patient had any unintentional weight loss or weight gain?  No   Diabetes:  Is the patient diabetic?  Yes  If diabetic, was a CBG obtained today?  No  Did the patient bring in their glucometer from home?  No  How often do you monitor your CBG's? Not lately.   Financial Strains and Diabetes Management:  Are you having any financial strains with the device, your supplies or your medication? No .  Does the patient want to be seen by Chronic Care Management for management of their diabetes?   No  Would the patient like to be referred to a Nutritionist or for Diabetic Management?  Yes   Diabetic Exams:  Diabetic Eye Exam: Completed January 2024 per pt. Pt has been advised about the importance in completing this exam.   Diabetic Foot Exam: Completed 07/05/21. Pt has been advised about the importance in completing this exam.   Interpreter Needed?: No  Information entered by :: Kirke Shaggy, LPN   Activities of Daily Living    06/27/2022   11:39 AM 06/24/2022   12:40 PM  In your present state of health, do you have any difficulty performing the following activities:  Hearing? 0 0  Vision? 0 0  Difficulty concentrating or making decisions? 0 0  Walking or climbing stairs? 1 1  Dressing or bathing? 0 0  Doing errands, shopping? 0 0  Preparing Food and eating ? N N  Using the Toilet? N N  In the past six months, have you accidently leaked urine? Y Y  Do you have problems with loss of bowel control? N N  Managing your Medications? N N  Managing your Finances? Tempie Donning  Housekeeping or managing your Housekeeping? N N    Patient Care Team: Jon Billings, NP as PCP - General Lane Hacker, [facility] (Pharmacist)  Indicate any recent Medical Services you may have received from other than [facility] providers in the past year (date may be approximate).     Assessment:   This is a routine wellness examination for Hannah Calhoun.  Hearing/Vision screen Hearing Screening - Comments:: No aids Vision Screening - Comments:: Readers-Dr.Bell  Dietary issues and exercise activities discussed: Current Exercise Habits: Home exercise routine, Type of exercise: walking, Time (Minutes): 30, Frequency (Times/Week): 3, Weekly Exercise (Minutes/Week): 90, Intensity: Mild   Goals Addressed             This Visit's Progress    DIET -  EAT MORE FRUITS AND VEGETABLES         Depression Screen    06/27/2022   11:37 AM 04/05/2022    9:46 AM 02/23/2022    1:11 PM 01/24/2022    3:45 PM 12/31/2021     3:31 PM 10/19/2021    3:41 PM 07/05/2021   10:03 AM  PHQ 2/9 Scores  PHQ - 2 Score 0 0 0 0 0 0 0  PHQ- 9 Score 0 0 1 0 2 1 2    $ Fall Risk    06/27/2022   11:39 AM 06/24/2022   12:40 PM 04/05/2022    9:46 AM 02/23/2022    1:11 PM 01/24/2022    3:45 PM  Fall Risk   Falls in the past year? 0 0 0 1 0  Number falls in past yr: 0 0 0 0 0  Injury with Fall? 0 0 0 0 0  Risk for fall due to : No Fall Risks  No Fall Risks History of fall(s) No Fall Risks  Follow up Falls prevention discussed;Falls evaluation completed  Falls evaluation completed Falls evaluation completed Falls evaluation completed    FALL RISK PREVENTION PERTAINING TO THE HOME:  Any stairs in or around the home? Yes  If so, are there any without handrails? No  Home free of loose throw rugs in walkways, pet beds, electrical cords, etc? Yes  Adequate lighting in your home to reduce risk of falls? Yes   ASSISTIVE DEVICES UTILIZED TO PREVENT FALLS:  Life alert? No  Use of a cane, walker or w/c? Yes  Grab bars in the bathroom? No  Shower chair or bench in shower? No  Elevated toilet seat or a handicapped toilet? No    Cognitive Function:        06/27/2022   11:39 AM  6CIT Screen  What Year? 0 points  What month? 0 points  What time? 0 points  Count back from 20 0 points  Months in reverse 0 points  Repeat phrase 2 points  Total Score 2 points    Immunizations Immunization History  Administered Date(s) Administered   Covid-19, Mrna,Vaccine(Spikevax)53yr and older 03/25/2022   Influenza Split 02/28/2014   Influenza,inj,Quad PF,6+ Mos 01/20/2015, 01/13/2016, 02/23/2017, 03/02/2018, 01/21/2019, 01/19/2021, 01/24/2022   Influenza-Unspecified 01/13/2016, 02/23/2017, 03/02/2018, 01/21/2019, 04/17/2020   Moderna Covid-19 Vaccine Bivalent Booster 167yr& up 05/14/2021   Moderna SARS-COV2 Booster Vaccination 03/25/2022   Moderna Sars-Covid-2 Vaccination 03/03/2020, 05/14/2021   PFIZER Comirnaty(Gray  Top)Covid-19 Tri-Sucrose Vaccine 08/05/2019, 08/27/2019   PFIZER(Purple Top)SARS-COV-2 Vaccination 08/05/2019, 08/27/2019   PPD Test 05/15/2020   Pneumococcal Conjugate-13 01/20/2015   Pneumococcal Polysaccharide-23 12/25/2015   Tdap 03/11/2014    TDAP status: Up to date  Flu Vaccine status: Up to date  Pneumococcal vaccine status: Up to date  Covid-19 vaccine status: Completed vaccines  Qualifies for Shingles Vaccine? No   Zostavax completed No   Shingrix Completed?: No.    Education has been provided regarding the importance of this vaccine. Patient has been advised to call insurance company to determine out of pocket expense if they have not yet received this vaccine. Advised may also receive vaccine at local pharmacy or Health Dept. Verbalized acceptance and understanding.  Screening Tests Health Maintenance  Topic Date Due   Zoster Vaccines- Shingrix (1 of 2) Never done   OPHTHALMOLOGY EXAM  12/24/2020   Diabetic kidney evaluation - Urine ACR  07/05/2022   FOOT EXAM  07/05/2022   HEMOGLOBIN A1C  10/04/2022   MAMMOGRAM  11/19/2022   Diabetic kidney evaluation - eGFR measurement  04/06/2023   Medicare Annual Wellness (AWV)  06/28/2023   DTaP/Tdap/Td (2 - Td or Tdap) 03/11/2024   COLONOSCOPY (Pts 45-51yr Insurance coverage will need to be confirmed)  02/05/2032   INFLUENZA VACCINE  Completed   COVID-19 Vaccine  Completed   Hepatitis C Screening  Completed   HIV Screening  Completed   HPV VACCINES  Aged Out    Health Maintenance  Health Maintenance Due  Topic Date Due   Zoster Vaccines- Shingrix (1 of 2) Never done   OPHTHALMOLOGY EXAM  12/24/2020   Diabetic kidney evaluation - Urine ACR  07/05/2022    Colorectal cancer screening: Type of screening: Colonoscopy. Completed 02/04/22. Repeat every 10 years  Mammogram status: Completed 11/18/21. Repeat every year  Lung Cancer Screening: (Low Dose CT Chest recommended if Age 60-80years, 30 pack-year currently smoking  OR have quit w/in 15years.) does not qualify.   Additional Screening:  Hepatitis C Screening: does qualify; Completed 09/17/19  Vision Screening: Recommended annual ophthalmology exams for early detection of glaucoma and other disorders of the eye. Is the patient up to date with their annual eye exam?  Yes  Who is the provider or what is the name of the office in which the patient attends annual eye exams? Dr.Bell If pt is not established with a provider, would they like to be referred to a provider to establish care? No .   Dental Screening: Recommended annual dental exams for proper oral hygiene  Community Resource Referral / Chronic Care Management: CRR required this visit?  No   CCM required this visit?  No      Plan:     I have personally reviewed and noted the following in the patient's chart:   Medical and social history Use of alcohol, tobacco or illicit drugs  Current medications and supplements including opioid prescriptions. Patient is not currently taking opioid prescriptions. Functional ability and status Nutritional status Physical activity Advanced directives List of other physicians Hospitalizations, surgeries, and ER visits in previous 12 months Vitals Screenings to include cognitive, depression, and falls Referrals and appointments  In addition, I have reviewed and discussed with patient certain preventive protocols, quality metrics, and best practice recommendations. A written personalized care plan for preventive services as well as general preventive health recommendations were provided to patient.     LDionisio David LPN   [id number]  Nurse Notes: none

## 2022-06-27 NOTE — Patient Instructions (Signed)
Ms. Hannah Calhoun , Thank you for taking time to come for your Medicare Wellness Visit. I appreciate your ongoing commitment to your health goals. Please review the following plan we discussed and let me know if I can assist you in the future.   These are the goals we discussed:  Goals      DIET - EAT MORE FRUITS AND VEGETABLES     Patient Stated     Maintain A!C under 6.5        This is a list of the screening recommended for you and due dates:  Health Maintenance  Topic Date Due   Zoster (Shingles) Vaccine (1 of 2) Never done   Eye exam for diabetics  12/24/2020   Yearly kidney health urinalysis for diabetes  07/05/2022   Complete foot exam   07/05/2022   Hemoglobin A1C  10/04/2022   Mammogram  11/19/2022   Yearly kidney function blood test for diabetes  04/06/2023   Medicare Annual Wellness Visit  06/28/2023   DTaP/Tdap/Td vaccine (2 - Td or Tdap) 03/11/2024   Colon Cancer Screening  02/05/2032   Flu Shot  Completed   COVID-19 Vaccine  Completed   Hepatitis C Screening: USPSTF Recommendation to screen - Ages 18-79 yo.  Completed   HIV Screening  Completed   HPV Vaccine  Aged Out    Advanced directives: no  Conditions/risks identified: none  Next appointment: Follow up in one year for your annual wellness visit. 07/03/23 @ 10:45 am by phone  Preventive Care 40-64 Years, Female Preventive care refers to lifestyle choices and visits with your health care provider that can promote health and wellness. What does preventive care include? A yearly physical exam. This is also called an annual well check. Dental exams once or twice a year. Routine eye exams. Ask your health care provider how often you should have your eyes checked. Personal lifestyle choices, including: Daily care of your teeth and gums. Regular physical activity. Eating a healthy diet. Avoiding tobacco and drug use. Limiting alcohol use. Practicing safe sex. Taking low-dose aspirin daily starting at age  27. Taking vitamin and mineral supplements as recommended by your health care provider. What happens during an annual well check? The services and screenings done by your health care provider during your annual well check will depend on your age, overall health, lifestyle risk factors, and family history of disease. Counseling  Your health care provider may ask you questions about your: Alcohol use. Tobacco use. Drug use. Emotional well-being. Home and relationship well-being. Sexual activity. Eating habits. Work and work Statistician. Method of birth control. Menstrual cycle. Pregnancy history. Screening  You may have the following tests or measurements: Height, weight, and BMI. Blood pressure. Lipid and cholesterol levels. These may be checked every 5 years, or more frequently if you are over 62 years old. Skin check. Lung cancer screening. You may have this screening every year starting at age 8 if you have a 30-pack-year history of smoking and currently smoke or have quit within the past 15 years. Fecal occult blood test (FOBT) of the stool. You may have this test every year starting at age 56. Flexible sigmoidoscopy or colonoscopy. You may have a sigmoidoscopy every 5 years or a colonoscopy every 10 years starting at age 36. Hepatitis C blood test. Hepatitis B blood test. Sexually transmitted disease (STD) testing. Diabetes screening. This is done by checking your blood sugar (glucose) after you have not eaten for a while (fasting). You may have this  done every 1-3 years. Mammogram. This may be done every 1-2 years. Talk to your health care provider about when you should start having regular mammograms. This may depend on whether you have a family history of breast cancer. BRCA-related cancer screening. This may be done if you have a family history of breast, ovarian, tubal, or peritoneal cancers. Pelvic exam and Pap test. This may be done every 3 years starting at age 19.  Starting at age 58, this may be done every 5 years if you have a Pap test in combination with an HPV test. Bone density scan. This is done to screen for osteoporosis. You may have this scan if you are at high risk for osteoporosis. Discuss your test results, treatment options, and if necessary, the need for more tests with your health care provider. Vaccines  Your health care provider may recommend certain vaccines, such as: Influenza vaccine. This is recommended every year. Tetanus, diphtheria, and acellular pertussis (Tdap, Td) vaccine. You may need a Td booster every 10 years. Zoster vaccine. You may need this after age 94. Pneumococcal 13-valent conjugate (PCV13) vaccine. You may need this if you have certain conditions and were not previously vaccinated. Pneumococcal polysaccharide (PPSV23) vaccine. You may need one or two doses if you smoke cigarettes or if you have certain conditions. Talk to your health care provider about which screenings and vaccines you need and how often you need them. This information is not intended to replace advice given to you by your health care provider. Make sure you discuss any questions you have with your health care provider. Document Released: 05/22/2015 Document Revised: 01/13/2016 Document Reviewed: 02/24/2015 Elsevier Interactive Patient Education  2017 [employer] Prevention in the Home Falls can cause injuries. They can happen to people of all ages. There are many things you can do to make your home safe and to help prevent falls. What can I do on the outside of my home? Regularly fix the edges of walkways and driveways and fix any cracks. Remove anything that might make you trip as you walk through a door, such as a raised step or threshold. Trim any bushes or trees on the path to your home. Use bright outdoor lighting. Clear any walking paths of anything that might make someone trip, such as rocks or tools. Regularly check to see if  handrails are loose or broken. Make sure that both sides of any steps have handrails. Any raised decks and porches should have guardrails on the edges. Have any leaves, snow, or ice cleared regularly. Use sand or salt on walking paths during winter. Clean up any spills in your garage right away. This includes oil or grease spills. What can I do in the bathroom? Use night lights. Install grab bars by the toilet and in the tub and shower. Do not use towel bars as grab bars. Use non-skid mats or decals in the tub or shower. If you need to sit down in the shower, use a plastic, non-slip stool. Keep the floor dry. Clean up any water that spills on the floor as soon as it happens. Remove soap buildup in the tub or shower regularly. Attach bath mats securely with double-sided non-slip rug tape. Do not have throw rugs and other things on the floor that can make you trip. What can I do in the bedroom? Use night lights. Make sure that you have a light by your bed that is easy to reach. Do not use any  sheets or blankets that are too big for your bed. They should not hang down onto the floor. Have a firm chair that has side arms. You can use this for support while you get dressed. Do not have throw rugs and other things on the floor that can make you trip. What can I do in the kitchen? Clean up any spills right away. Avoid walking on wet floors. Keep items that you use a lot in easy-to-reach places. If you need to reach something above you, use a strong step stool that has a grab bar. Keep electrical cords out of the way. Do not use floor polish or wax that makes floors slippery. If you must use wax, use non-skid floor wax. Do not have throw rugs and other things on the floor that can make you trip. What can I do with my stairs? Do not leave any items on the stairs. Make sure that there are handrails on both sides of the stairs and use them. Fix handrails that are broken or loose. Make sure that  handrails are as long as the stairways. Check any carpeting to make sure that it is firmly attached to the stairs. Fix any carpet that is loose or worn. Avoid having throw rugs at the top or bottom of the stairs. If you do have throw rugs, attach them to the floor with carpet tape. Make sure that you have a light switch at the top of the stairs and the bottom of the stairs. If you do not have them, ask someone to add them for you. What else can I do to help prevent falls? Wear shoes that: Do not have high heels. Have rubber bottoms. Are comfortable and fit you well. Are closed at the toe. Do not wear sandals. If you use a stepladder: Make sure that it is fully opened. Do not climb a closed stepladder. Make sure that both sides of the stepladder are locked into place. Ask someone to hold it for you, if possible. Clearly mark and make sure that you can see: Any grab bars or handrails. First and last steps. Where the edge of each step is. Use tools that help you move around (mobility aids) if they are needed. These include: Canes. Walkers. Scooters. Crutches. Turn on the lights when you go into a dark area. Replace any light bulbs as soon as they burn out. Set up your furniture so you have a clear path. Avoid moving your furniture around. If any of your floors are uneven, fix them. If there are any pets around you, be aware of where they are. Review your medicines with your doctor. Some medicines can make you feel dizzy. This can increase your chance of falling. Ask your doctor what other things that you can do to help prevent falls. This information is not intended to replace advice given to you by your health care provider. Make sure you discuss any questions you have with your health care provider. Document Released: 02/19/2009 Document Revised: 10/01/2015 Document Reviewed: 05/30/2014 Elsevier Interactive Patient Education  2017 [employer].

## 2022-07-04 ENCOUNTER — Other Ambulatory Visit: Payer: Self-pay | Admitting: Nurse Practitioner

## 2022-07-04 ENCOUNTER — Encounter: Payer: Self-pay | Admitting: Nurse Practitioner

## 2022-07-04 ENCOUNTER — Ambulatory Visit (INDEPENDENT_AMBULATORY_CARE_PROVIDER_SITE_OTHER): Payer: PPO | Admitting: Nurse Practitioner

## 2022-07-04 VITALS — BP 119/72 | HR 70 | Temp 99.7°F | Wt 206.5 lb

## 2022-07-04 DIAGNOSIS — E1159 Type 2 diabetes mellitus with other circulatory complications: Secondary | ICD-10-CM

## 2022-07-04 DIAGNOSIS — T380X5A Adverse effect of glucocorticoids and synthetic analogues, initial encounter: Secondary | ICD-10-CM | POA: Diagnosis not present

## 2022-07-04 DIAGNOSIS — E1169 Type 2 diabetes mellitus with other specified complication: Secondary | ICD-10-CM

## 2022-07-04 DIAGNOSIS — F419 Anxiety disorder, unspecified: Secondary | ICD-10-CM

## 2022-07-04 DIAGNOSIS — F32A Depression, unspecified: Secondary | ICD-10-CM | POA: Diagnosis not present

## 2022-07-04 DIAGNOSIS — E669 Obesity, unspecified: Secondary | ICD-10-CM | POA: Diagnosis not present

## 2022-07-04 DIAGNOSIS — E785 Hyperlipidemia, unspecified: Secondary | ICD-10-CM

## 2022-07-04 DIAGNOSIS — Z0000 Encounter for general adult medical examination without abnormal findings: Secondary | ICD-10-CM | POA: Diagnosis not present

## 2022-07-04 DIAGNOSIS — G720 Drug-induced myopathy: Secondary | ICD-10-CM

## 2022-07-04 DIAGNOSIS — E559 Vitamin D deficiency, unspecified: Secondary | ICD-10-CM

## 2022-07-04 DIAGNOSIS — I152 Hypertension secondary to endocrine disorders: Secondary | ICD-10-CM | POA: Diagnosis not present

## 2022-07-04 NOTE — Assessment & Plan Note (Signed)
Chronic.  Controlled. Continue with amlodipine, HCTZ, metoprolol and Valsartan '40mg'$  daily.  Labs ordered today.  Follow up in 3 months for reevaluation.  Continue to check blood pressures at home.  Bring log to next visit.  Call sooner if concerns arise.

## 2022-07-04 NOTE — Assessment & Plan Note (Signed)
Not able to tolerate statins.

## 2022-07-04 NOTE — Assessment & Plan Note (Signed)
Chronic.  Not able to tolerate statins. Labs ordered today.  Return to clinic in 6 months for reevaluation.  Call sooner if concerns arise.

## 2022-07-04 NOTE — Progress Notes (Signed)
BP 119/72   Pulse 70   Temp 99.7 F (37.6 C) (Oral)   Wt 206 lb 8 oz (93.7 kg)   LMP  (LMP Unknown)   SpO2 98%   BMI 33.33 kg/m    Subjective:    Patient ID: Hannah Calhoun, female    DOB: 03-19-1961, 62 y.o.   MRN: [id number]  HPI: Cortland Denbo is a 62 y.o. female  Chief Complaint  Patient presents with   Diabetes    Eye exam requested from [facility]   Hyperlipidemia   Hypertension   HYPERTENSION / HYPERLIPIDEMIA Satisfied with current treatment? no Duration of hypertension: years BP monitoring frequency: rarely BP range: 130/80 BP medication side effects: no Past BP meds:  metoprolol, amlodipine and HCTZ and valsartan Duration of hyperlipidemia: years Cholesterol medication side effects: no Cholesterol supplements: none Past cholesterol medications:no taking Medication compliance: excellent compliance Aspirin: no Recent stressors: no Recurrent headaches: no Visual changes: no Palpitations: no Dyspnea: no Chest pain: no Lower extremity edema: no Dizzy/lightheaded: no  The 10-year ASCVD risk score (Arnett DK, et al., 2019) is: 12.6%   Values used to calculate the score:     Age: 89 years     Sex: Female     Is Non-Hispanic African American: Yes     Diabetic: Yes     Tobacco smoker: No     Systolic Blood Pressure: 123456 mmHg     Is BP treated: Yes     HDL Cholesterol: 64 mg/dL     Total Cholesterol: 237 mg/dL   DIABETES Hypoglycemic episodes:no Polydipsia/polyuria: no Visual disturbance: no Chest pain: no Paresthesias: yes Glucose Monitoring: yes  Accucheck frequency: not checking  Fasting glucose:   Post prandial:  Evening:  Before meals: Taking Insulin?: no  Long acting insulin:  Short acting insulin: Blood Pressure Monitoring: rarely Retinal Examination: Up to Date- requested Foot Exam: Up to Date Diabetic Education: Not Completed Pneumovax: Up to Date Influenza: Up to Date Aspirin:  no  ANXIETY/DEPRESSION Patient feels like her mood is good.  She has been feeling really good.   Denies concerns about medications.  Denies SI.    Relevant past medical, surgical, family and social history reviewed and updated as indicated. Interim medical history since our last visit reviewed. Allergies and medications reviewed and updated.  Review of Systems  Eyes:  Negative for visual disturbance.  Respiratory:  Negative for cough, chest tightness and shortness of breath.   Cardiovascular:  Negative for chest pain, palpitations and leg swelling.  Skin:  Positive for rash.  Neurological:  Negative for dizziness and headaches.  Psychiatric/Behavioral:  Negative for dysphoric mood and suicidal ideas. The patient is not nervous/anxious.     Per HPI unless specifically indicated above     Objective:    BP 119/72   Pulse 70   Temp 99.7 F (37.6 C) (Oral)   Wt 206 lb 8 oz (93.7 kg)   LMP  (LMP Unknown)   SpO2 98%   BMI 33.33 kg/m   Wt Readings from Last 3 Encounters:  07/04/22 206 lb 8 oz (93.7 kg)  06/27/22 197 lb (89.4 kg)  04/05/22 197 lb 8 oz (89.6 kg)    Physical Exam Vitals and nursing note reviewed.  Constitutional:      General: She is not in acute distress.    Appearance: Normal appearance. She is normal weight. She is not ill-appearing, toxic-appearing or diaphoretic.  HENT:     Head: Normocephalic.  Right Ear: External ear normal.     Left Ear: External ear normal.     Nose: Nose normal.     Mouth/Throat:     Mouth: Mucous membranes are moist.     Pharynx: Oropharynx is clear.  Eyes:     General:        Right eye: No discharge.        Left eye: No discharge.     Extraocular Movements: Extraocular movements intact.     Conjunctiva/sclera: Conjunctivae normal.     Pupils: Pupils are equal, round, and reactive to light.  Cardiovascular:     Rate and Rhythm: Normal rate and regular rhythm.     Heart sounds: No murmur heard. Pulmonary:     Effort:  Pulmonary effort is normal. No respiratory distress.     Breath sounds: Normal breath sounds. No wheezing or rales.  Musculoskeletal:     Cervical back: Normal range of motion and neck supple.  Skin:    General: Skin is warm and dry.     Capillary Refill: Capillary refill takes less than 2 seconds.       Neurological:     General: No focal deficit present.     Mental Status: She is alert and oriented to person, place, and time. Mental status is at baseline.  Psychiatric:        Mood and Affect: Mood normal.        Behavior: Behavior normal.        Thought Content: Thought content normal.        Judgment: Judgment normal.     Results for orders placed or performed in visit on 04/05/22  Comp Met (CMET)  Result Value Ref Range   Glucose 109 (H) 70 - 99 mg/dL   BUN 12 6 - 24 mg/dL   Creatinine, Ser 0.78 0.57 - 1.00 mg/dL   eGFR 88 >59 mL/min/1.73   BUN/Creatinine Ratio 15 9 - 23   Sodium 142 134 - 144 mmol/L   Potassium 3.8 3.5 - 5.2 mmol/L   Chloride 104 96 - 106 mmol/L   CO2 23 20 - 29 mmol/L   Calcium 9.5 8.7 - 10.2 mg/dL   Total Protein 6.8 6.0 - 8.5 g/dL   Albumin 4.4 3.8 - 4.9 g/dL   Globulin, Total 2.4 1.5 - 4.5 g/dL   Albumin/Globulin Ratio 1.8 1.2 - 2.2   Bilirubin Total 0.3 0.0 - 1.2 mg/dL   Alkaline Phosphatase 59 44 - 121 IU/L   AST 18 0 - 40 IU/L   ALT 19 0 - 32 IU/L  Lipid Profile  Result Value Ref Range   Cholesterol, Total 237 (H) 100 - 199 mg/dL   Triglycerides 110 0 - 149 mg/dL   HDL 64 >39 mg/dL   VLDL Cholesterol Cal 19 5 - 40 mg/dL   LDL Chol Calc (NIH) 154 (H) 0 - 99 mg/dL   Chol/HDL Ratio 3.7 0.0 - 4.4 ratio  HgB A1c  Result Value Ref Range   Hgb A1c MFr Bld 7.0 (H) 4.8 - 5.6 %   Est. average glucose Bld gHb Est-mCnc 154 mg/dL  Vitamin D (25 hydroxy)  Result Value Ref Range   Vit D, 25-Hydroxy 49.5 30.0 - 100.0 ng/mL      Assessment & Plan:   Problem List Items Addressed This Visit       Cardiovascular and Mediastinum   Hypertension  associated with diabetes ([place]) - Primary    Chronic.  Controlled. Continue with  amlodipine, HCTZ, metoprolol and Valsartan '40mg'$  daily.  Labs ordered today.  Follow up in 3 months for reevaluation.  Continue to check blood pressures at home.  Bring log to next visit.  Call sooner if concerns arise.       Relevant Orders   Comprehensive metabolic panel     Endocrine   Type 2 diabetes mellitus with other specified complication (HCC)    Chronic, stable. Labs ordered today. Continue current regimen of Glipizide '5mg'$  daily.  A1c controlled at 6.0.  Patient would rather work on diet than add medication.  Continue with diet changes.  Requested Eye exam.  Follow up in 3 months.  Call sooner if concerns arise.      Relevant Orders   Microalbumin, Urine Waived   HgB A1c   Hyperlipidemia associated with type 2 diabetes mellitus (HCC)    Chronic.  Not able to tolerate statins. Labs ordered today.  Return to clinic in 6 months for reevaluation.  Call sooner if concerns arise.        Relevant Orders   Lipid panel     Musculoskeletal and Integument   Steroid-induced myopathy    Not able to tolerate statins.        Other   Obesity (BMI 30-39.9)    Recommended eating smaller high protein, low fat meals more frequently and exercising 30 mins a day 5 times a week with a goal of 10-15lb weight loss in the next 3 months.       Anxiety and depression    Chronic.  Controlled.  Continue with current medication regimen.  Labs ordered today.  Return to clinic in 6 months for reevaluation.  Call sooner if concerns arise.        Vitamin D deficiency    Labs ordered at visit today.  Will make recommendations based on lab results.        Relevant Orders   Vitamin D (25 hydroxy)     Follow up plan: Return in about 3 months (around 10/02/2022) for Physical and Fasting labs.

## 2022-07-04 NOTE — Assessment & Plan Note (Signed)
Recommended eating smaller high protein, low fat meals more frequently and exercising 30 mins a day 5 times a week with a goal of 10-15lb weight loss in the next 3 months.  

## 2022-07-04 NOTE — Assessment & Plan Note (Signed)
Labs ordered at visit today.  Will make recommendations based on lab results.   

## 2022-07-04 NOTE — Assessment & Plan Note (Signed)
Chronic.  Controlled.  Continue with current medication regimen.  Labs ordered today.  Return to clinic in 6 months for reevaluation.  Call sooner if concerns arise.  ? ?

## 2022-07-04 NOTE — Assessment & Plan Note (Signed)
Chronic, stable. Labs ordered today. Continue current regimen of Glipizide '5mg'$  daily.  A1c controlled at 6.0.  Patient would rather work on diet than add medication.  Continue with diet changes.  Requested Eye exam.  Follow up in 3 months.  Call sooner if concerns arise.

## 2022-07-05 LAB — LIPID PANEL
Chol/HDL Ratio: 3.7 ratio (ref 0.0–4.4)
Cholesterol, Total: 241 mg/dL — ABNORMAL HIGH (ref 100–199)
HDL: 66 mg/dL (ref >39)
LDL Chol Calc (NIH): 149 mg/dL — ABNORMAL HIGH (ref 0–99)
Triglycerides: 144 mg/dL (ref 0–149)
VLDL Cholesterol Cal: 26 mg/dL (ref 5–40)

## 2022-07-05 LAB — COMPREHENSIVE METABOLIC PANEL
ALT: 20 IU/L (ref 0–32)
AST: 18 IU/L (ref 0–40)
Albumin/Globulin Ratio: 2 (ref 1.2–2.2)
Albumin: 4.5 g/dL (ref 3.8–4.9)
Alkaline Phosphatase: 59 IU/L (ref 44–121)
BUN/Creatinine Ratio: 12 (ref 9–23)
BUN: 11 mg/dL (ref 6–24)
Bilirubin Total: 0.3 mg/dL (ref 0.0–1.2)
CO2: 25 mmol/L (ref 20–29)
Calcium: 9.6 mg/dL (ref 8.7–10.2)
Chloride: 103 mmol/L (ref 96–106)
Creatinine, Ser: 0.9 mg/dL (ref 0.57–1.00)
Globulin, Total: 2.3 g/dL (ref 1.5–4.5)
Glucose: 119 mg/dL — ABNORMAL HIGH (ref 70–99)
Potassium: 4 mmol/L (ref 3.5–5.2)
Sodium: 142 mmol/L (ref 134–144)
Total Protein: 6.8 g/dL (ref 6.0–8.5)
eGFR: 74 mL/min/{1.73_m2} (ref >59)

## 2022-07-05 LAB — HEMOGLOBIN A1C
Est. average glucose Bld gHb Est-mCnc: 140 mg/dL
Hgb A1c MFr Bld: 6.5 % — ABNORMAL HIGH (ref 4.8–5.6)

## 2022-07-05 LAB — VITAMIN D 25 HYDROXY (VIT D DEFICIENCY, FRACTURES): Vit D, 25-Hydroxy: 43.1 ng/mL (ref 30.0–100.0)

## 2022-07-05 NOTE — Telephone Encounter (Signed)
Requested medication (s) are due for refill today: yes  Requested medication (s) are on the active medication list: yes    Last refill: 01/24/22 #12  1 refill  Future visit scheduled yes 10/04/22  Notes to clinic:Not delegated, please review. Thank you.  Requested Prescriptions  Pending Prescriptions Disp Refills   Vitamin D, Ergocalciferol, (DRISDOL) 1.25 MG (50000 UNIT) CAPS capsule [Pharmacy Med Name: VITAMIN D2 1.'25MG'$ (50,000 UNIT)] 12 capsule 1    Sig: Take 1 capsule (50,000 Units total) by mouth every 7 (seven) days.     Endocrinology:  Vitamins - Vitamin D Supplementation 2 Failed - 07/04/2022  1:46 AM      Failed - Manual Review: Route requests for 50,000 IU strength to the provider      Passed - Ca in normal range and within 360 days    Calcium  Date Value Ref Range Status  07/04/2022 9.6 8.7 - 10.2 mg/dL Final   Calcium, Total  Date Value Ref Range Status  01/22/2013 8.2 (L) 8.5 - 10.1 mg/dL Final         Passed - Vitamin D in normal range and within 360 days    Vit D, 25-Hydroxy  Date Value Ref Range Status  07/04/2022 43.1 30.0 - 100.0 ng/mL Final    Comment:    Vitamin D deficiency has been defined by the Institute of Medicine and an Endocrine Society practice guideline as a level of serum 25-OH vitamin D less than 20 ng/mL (1,2). The Endocrine Society went on to further define vitamin D insufficiency as a level between 21 and 29 ng/mL (2). 1. IOM (Institute of Medicine). 2010. Dietary reference    intakes for calcium and D. [place]: The    [employer]. 2. Holick MF, [place], Bischoff-Ferrari HA, et al.    Evaluation, treatment, and prevention of vitamin D    deficiency: an Endocrine Society clinical practice    guideline. JCEM. 2011 Jul; 96(7):1911-30.          Passed - Valid encounter within last 12 months    Recent Outpatient Visits           Yesterday Hypertension associated with diabetes [facility])   [place] Jon Billings, NP   3 months ago Hypertension associated with diabetes [facility])   [place] Jon Billings, NP   4 months ago Viral upper respiratory tract infection   [place], Karen, NP   5 months ago Hypertension associated with diabetes [facility])   [place], Karen, NP   6 months ago Hypertension associated with diabetes [facility])   [place] Jon Billings, NP       Future Appointments             In 3 months Jon Billings, NP [place], [place]   In 3 months Jon Billings, NP [place], [facility]

## 2022-07-05 NOTE — Progress Notes (Signed)
Ms. Hannah Calhoun. Your vitamin D is within normal range.  I want you to stop the vitamin D 50,000 IU weekly and start 1000 IU daily.

## 2022-07-05 NOTE — Progress Notes (Signed)
Hi Ms. Hannah Calhoun. It was nice to see you yesterday.  Your lab work looks good.  Your cholesterol is a about the same as prior.  A1c improved to 6.5.  Keep up the good work.  No concerns at this time. Continue with your current medication regimen.  Follow up as discussed.  Please let me know if you have any questions.

## 2022-07-07 ENCOUNTER — Ambulatory Visit: Payer: HMO | Admitting: Nurse Practitioner

## 2022-07-22 ENCOUNTER — Other Ambulatory Visit: Payer: Self-pay | Admitting: Nurse Practitioner

## 2022-07-22 NOTE — Telephone Encounter (Signed)
Future visit in 2 months.  Requested Prescriptions  Pending Prescriptions Disp Refills   valsartan (DIOVAN) 40 MG tablet [Pharmacy Med Name: VALSARTAN 40 MG TABLET] 90 tablet 0    Sig: TAKE 1 TABLET BY MOUTH EVERY DAY     Cardiovascular:  Angiotensin Receptor Blockers Passed - 07/22/2022  5:03 PM      Passed - Cr in normal range and within 180 days    Creatinine  Date Value Ref Range Status  01/22/2013 0.86 0.60 - 1.30 mg/dL Final   Creatinine, Ser  Date Value Ref Range Status  07/04/2022 0.90 0.57 - 1.00 mg/dL Final   Creatinine,U  Date Value Ref Range Status  09/10/2014 65.8 mg/dL Final         Passed - K in normal range and within 180 days    Potassium  Date Value Ref Range Status  07/04/2022 4.0 3.5 - 5.2 mmol/L Final  01/22/2013 3.1 (L) 3.5 - 5.1 mmol/L Final         Passed - Patient is not pregnant      Passed - Last BP in normal range    BP Readings from Last 1 Encounters:  07/04/22 119/72         Passed - Valid encounter within last 6 months    Recent Outpatient Visits           2 weeks ago Hypertension associated with diabetes [facility])   [place] Jon Billings, NP   3 months ago Hypertension associated with diabetes [facility])   [place] Jon Billings, NP   4 months ago Viral upper respiratory tract infection   [place], Karen, NP   5 months ago Hypertension associated with diabetes [facility])   [place], Karen, NP   6 months ago Hypertension associated with diabetes [facility])   [place] Jon Billings, NP       Future Appointments             In 2 months Jon Billings, NP [facility], [place]   In 2 months Jon Billings, NP [place], [facility]

## 2022-08-03 ENCOUNTER — Other Ambulatory Visit: Payer: Self-pay | Admitting: Nurse Practitioner

## 2022-08-03 NOTE — Telephone Encounter (Signed)
Requested medication (s) are due for refill today: yes  Requested medication (s) are on the active medication list: yes  Last refill:  04/05/22  Future visit scheduled: yes  Notes to clinic:  Unable to refill per protocol, cannot delegate.      Requested Prescriptions  Pending Prescriptions Disp Refills   triamcinolone cream (KENALOG) 0.1 % [Pharmacy Med Name: TRIAMCINOLONE 0.1% CREAM] 30 g 0    Sig: APPLY TO AFFECTED AREA TWICE A DAY     Not Delegated - Dermatology:  Corticosteroids Failed - 08/03/2022  8:23 AM      Failed - This refill cannot be delegated      Passed - Valid encounter within last 12 months    Recent Outpatient Visits           1 month ago Hypertension associated with diabetes [facility])   [place] Jon Billings, NP   4 months ago Hypertension associated with diabetes [facility])   [place] Jon Billings, NP   5 months ago Viral upper respiratory tract infection   [place] Jon Billings, NP   6 months ago Hypertension associated with diabetes [facility])   [place] Jon Billings, NP   7 months ago Hypertension associated with diabetes [facility])   [place] Jon Billings, NP       Future Appointments             In 2 months Jon Billings, NP [place], [facility]   In 2 months Jon Billings, NP [place], [facility]            Signed Prescriptions Disp Refills   metoprolol succinate (TOPROL-XL) 25 MG 24 hr tablet 90 tablet 0    Sig: TAKE 1 TABLET (25 MG TOTAL) BY MOUTH DAILY.     Cardiovascular:  Beta Blockers Passed - 08/03/2022  8:23 AM      Passed - Last BP in normal range    BP Readings from Last 1 Encounters:  07/04/22 119/72         Passed - Last Heart Rate in normal range    Pulse Readings from Last 1 Encounters:  07/04/22 70         Passed -  Valid encounter within last 6 months    Recent Outpatient Visits           1 month ago Hypertension associated with diabetes [facility])   [place] Jon Billings, NP   4 months ago Hypertension associated with diabetes [facility])   [place] Jon Billings, NP   5 months ago Viral upper respiratory tract infection   [place] Jon Billings, NP   6 months ago Hypertension associated with diabetes [facility])   [place] Jon Billings, NP   7 months ago Hypertension associated with diabetes [facility])   [place] Jon Billings, NP       Future Appointments             In 2 months Jon Billings, NP [place], [facility]   In 2 months Jon Billings, NP [place], [facility]             potassium chloride SA (KLOR-CON M20) 20 MEQ tablet 180 tablet 0    Sig: TAKE 1 TABLET BY MOUTH TWICE A  DAY     Endocrinology:  Minerals - Potassium Supplementation Passed - 08/03/2022  8:23 AM      Passed - K in normal range and within 360 days    Potassium  Date Value Ref Range Status  07/04/2022 4.0 3.5 - 5.2 mmol/L Final  01/22/2013 3.1 (L) 3.5 - 5.1 mmol/L Final         Passed - Cr in normal range and within 360 days    Creatinine  Date Value Ref Range Status  01/22/2013 0.86 0.60 - 1.30 mg/dL Final   Creatinine, Ser  Date Value Ref Range Status  07/04/2022 0.90 0.57 - 1.00 mg/dL Final   Creatinine,U  Date Value Ref Range Status  09/10/2014 65.8 mg/dL Final         Passed - Valid encounter within last 12 months    Recent Outpatient Visits           1 month ago Hypertension associated with diabetes [facility])   [place] Jon Billings, NP   4 months ago Hypertension associated with diabetes [facility])   [place] Jon Billings, NP   5 months ago  Viral upper respiratory tract infection   [place] Jon Billings, NP   6 months ago Hypertension associated with diabetes [facility])   [place] Jon Billings, NP   7 months ago Hypertension associated with diabetes [facility])   [place], NP       Future Appointments             In 2 months Jon Billings, NP [place], [facility]   In 2 months Jon Billings, NP [place], [facility]             hydrochlorothiazide (HYDRODIURIL) 25 MG tablet 90 tablet 0    Sig: TAKE 1 TABLET (25 MG TOTAL) BY MOUTH DAILY.     Cardiovascular: Diuretics - Thiazide Passed - 08/03/2022  8:23 AM      Passed - Cr in normal range and within 180 days    Creatinine  Date Value Ref Range Status  01/22/2013 0.86 0.60 - 1.30 mg/dL Final   Creatinine, Ser  Date Value Ref Range Status  07/04/2022 0.90 0.57 - 1.00 mg/dL Final   Creatinine,U  Date Value Ref Range Status  09/10/2014 65.8 mg/dL Final         Passed - K in normal range and within 180 days    Potassium  Date Value Ref Range Status  07/04/2022 4.0 3.5 - 5.2 mmol/L Final  01/22/2013 3.1 (L) 3.5 - 5.1 mmol/L Final         Passed - Na in normal range and within 180 days    Sodium  Date Value Ref Range Status  07/04/2022 142 134 - 144 mmol/L Final  01/22/2013 134 (L) 136 - 145 mmol/L Final         Passed - Last BP in normal range    BP Readings from Last 1 Encounters:  07/04/22 119/72         Passed - Valid encounter within last 6 months    Recent Outpatient Visits           1 month ago Hypertension associated with diabetes [facility])   [place], NP   4 months ago Hypertension associated with diabetes [facility])   [place]  Jon Billings, NP   5 months ago Viral upper respiratory tract infection   [place] Jon Billings, NP   6 months ago Hypertension associated with diabetes [facility])   [place] Jon Billings, NP   7 months ago Hypertension associated with diabetes [facility])   [place] Jon Billings, NP       Future Appointments             In 2 months Jon Billings, NP [place], [facility]   In 2 months Jon Billings, NP [place], [facility]             amLODipine (NORVASC) 10 MG tablet 90 tablet 0    Sig: TAKE 1 TABLET BY MOUTH EVERY DAY     Cardiovascular: Calcium Channel Blockers 2 Passed - 08/03/2022  8:23 AM      Passed - Last BP in normal range    BP Readings from Last 1 Encounters:  07/04/22 119/72         Passed - Last Heart Rate in normal range    Pulse Readings from Last 1 Encounters:  07/04/22 70         Passed - Valid encounter within last 6 months    Recent Outpatient Visits           1 month ago Hypertension associated with diabetes [facility])   [place] Jon Billings, NP   4 months ago Hypertension associated with diabetes [facility])   [place] Jon Billings, NP   5 months ago Viral upper respiratory tract infection   [place] Jon Billings, NP   6 months ago Hypertension associated with diabetes [facility])   [place] Jon Billings, NP   7 months ago Hypertension associated with diabetes [facility])   [place] Jon Billings, NP       Future Appointments             In 2 months Jon Billings, NP [place], [facility]   In 2 months Jon Billings, NP [place], [facility]            Refused Prescriptions Disp Refills   valsartan (DIOVAN) 40 MG tablet [Pharmacy Med Name: VALSARTAN 40 MG TABLET] 90 tablet 0    Sig: TAKE 1 TABLET BY MOUTH  EVERY DAY     Cardiovascular:  Angiotensin Receptor Blockers Passed - 08/03/2022  8:23 AM      Passed - Cr in normal range and within 180 days    Creatinine  Date Value Ref Range Status  01/22/2013 0.86 0.60 - 1.30 mg/dL Final   Creatinine, Ser  Date Value Ref Range Status  07/04/2022 0.90 0.57 - 1.00 mg/dL Final   Creatinine,U  Date Value Ref Range Status  09/10/2014 65.8 mg/dL Final         Passed - K in normal range and within 180 days    Potassium  Date Value Ref Range Status  07/04/2022 4.0 3.5 - 5.2 mmol/L Final  01/22/2013 3.1 (L) 3.5 - 5.1 mmol/L Final         Passed - Patient is not pregnant      Passed - Last BP in normal range    BP Readings from Last 1 Encounters:  07/04/22 119/72         Passed -  Valid encounter within last 6 months    Recent Outpatient Visits           1 month ago Hypertension associated with diabetes [facility])   [place] Jon Billings, NP   4 months ago Hypertension associated with diabetes [facility])   [place] Jon Billings, NP   5 months ago Viral upper respiratory tract infection   [place], Karen, NP   6 months ago Hypertension associated with diabetes [facility])   [place], NP   7 months ago Hypertension associated with diabetes [facility])   [place] Jon Billings, NP       Future Appointments             In 2 months Jon Billings, NP [place], [place]   In 2 months Jon Billings, NP [place], [facility]

## 2022-08-03 NOTE — Telephone Encounter (Signed)
Requested Prescriptions  Pending Prescriptions Disp Refills   metoprolol succinate (TOPROL-XL) 25 MG 24 hr tablet [Pharmacy Med Name: METOPROLOL SUCC ER 25 MG TAB] 90 tablet 0    Sig: TAKE 1 TABLET (25 MG TOTAL) BY MOUTH DAILY.     Cardiovascular:  Beta Blockers Passed - 08/03/2022  8:23 AM      Passed - Last BP in normal range    BP Readings from Last 1 Encounters:  07/04/22 119/72         Passed - Last Heart Rate in normal range    Pulse Readings from Last 1 Encounters:  07/04/22 70         Passed - Valid encounter within last 6 months    Recent Outpatient Visits           1 month ago Hypertension associated with diabetes [facility])   [place] Jon Billings, NP   4 months ago Hypertension associated with diabetes [facility])   [place] Jon Billings, NP   5 months ago Viral upper respiratory tract infection   [place] Jon Billings, NP   6 months ago Hypertension associated with diabetes [facility])   [place] Jon Billings, NP   7 months ago Hypertension associated with diabetes [facility])   [place] Jon Billings, NP       Future Appointments             In 2 months Jon Billings, NP [place], [facility]   In 2 months Jon Billings, NP [place], [facility]             KLOR-CON M20 20 [place] tablet [Pharmacy Med Name: KLOR-CON M20 TABLET] 180 tablet 0    Sig: TAKE [place] A DAY     Endocrinology:  Minerals - Potassium Supplementation Passed - 08/03/2022  8:23 AM      Passed - K in normal range and within 360 days    Potassium  Date Value Ref Range Status  07/04/2022 4.0 3.5 - 5.2 mmol/L Final  01/22/2013 3.1 (L) 3.5 - 5.1 mmol/L Final         Passed - Cr in normal range and within 360 days    Creatinine  Date Value Ref Range Status  01/22/2013 0.86 0.60 -  1.30 mg/dL Final   Creatinine, Ser  Date Value Ref Range Status  07/04/2022 0.90 0.57 - 1.00 mg/dL Final   Creatinine,U  Date Value Ref Range Status  09/10/2014 65.8 mg/dL Final         Passed - Valid encounter within last 12 months    Recent Outpatient Visits           1 month ago Hypertension associated with diabetes [facility])   [place] Jon Billings, NP   4 months ago Hypertension associated with diabetes [facility])   [place] Jon Billings, NP   5 months ago Viral upper respiratory tract infection   [place], Karen, NP   6 months ago Hypertension associated with diabetes [facility])   [place] Jon Billings, NP   7 months ago Hypertension associated with diabetes [facility])   [place] Jon Billings, NP       Future Appointments             In  2 months Jon Billings, NP [place], [facility]   In 2 months Jon Billings, NP [place], [facility]             hydrochlorothiazide (HYDRODIURIL) 25 MG tablet [Pharmacy Med Name: HYDROCHLOROTHIAZIDE 25 MG TAB] 90 tablet 0    Sig: TAKE 1 TABLET (25 MG TOTAL) BY MOUTH DAILY.     Cardiovascular: Diuretics - Thiazide Passed - 08/03/2022  8:23 AM      Passed - Cr in normal range and within 180 days    Creatinine  Date Value Ref Range Status  01/22/2013 0.86 0.60 - 1.30 mg/dL Final   Creatinine, Ser  Date Value Ref Range Status  07/04/2022 0.90 0.57 - 1.00 mg/dL Final   Creatinine,U  Date Value Ref Range Status  09/10/2014 65.8 mg/dL Final         Passed - K in normal range and within 180 days    Potassium  Date Value Ref Range Status  07/04/2022 4.0 3.5 - 5.2 mmol/L Final  01/22/2013 3.1 (L) 3.5 - 5.1 mmol/L Final         Passed - Na in normal range and within 180 days    Sodium  Date Value Ref Range Status   07/04/2022 142 134 - 144 mmol/L Final  01/22/2013 134 (L) 136 - 145 mmol/L Final         Passed - Last BP in normal range    BP Readings from Last 1 Encounters:  07/04/22 119/72         Passed - Valid encounter within last 6 months    Recent Outpatient Visits           1 month ago Hypertension associated with diabetes [facility])   [place] Jon Billings, NP   4 months ago Hypertension associated with diabetes [facility])   [place] Jon Billings, NP   5 months ago Viral upper respiratory tract infection   [place] Jon Billings, NP   6 months ago Hypertension associated with diabetes [facility])   [place] Jon Billings, NP   7 months ago Hypertension associated with diabetes [facility])   [place] Jon Billings, NP       Future Appointments             In 2 months Jon Billings, NP [place], [facility]   In 2 months Jon Billings, NP [place], [facility]             triamcinolone cream (KENALOG) 0.1 % [Pharmacy Med Name: TRIAMCINOLONE 0.1% CREAM] 30 g 0    Sig: APPLY TO AFFECTED AREA TWICE A DAY     Not Delegated - Dermatology:  Corticosteroids Failed - 08/03/2022  8:23 AM      Failed - This refill cannot be delegated      Passed - Valid encounter within last 12 months    Recent Outpatient Visits           1 month ago Hypertension associated with diabetes [facility])   [place] Jon Billings, NP   4 months ago Hypertension associated with diabetes [facility])   [place] Jon Billings, NP   5 months ago Viral upper respiratory tract infection   [place], NP   6 months ago Hypertension associated with diabetes ([place])  [place] Jon Billings, NP   7  months ago Hypertension associated with diabetes [facility])   [place] Jon Billings, NP       Future Appointments             In 2 months Jon Billings, NP [place], [facility]   In 2 months Jon Billings, NP [place], [facility]             amLODipine (NORVASC) 10 MG tablet [Pharmacy Med Name: AMLODIPINE BESYLATE 10 MG TAB] 90 tablet 0    Sig: TAKE 1 TABLET BY MOUTH EVERY DAY     Cardiovascular: Calcium Channel Blockers 2 Passed - 08/03/2022  8:23 AM      Passed - Last BP in normal range    BP Readings from Last 1 Encounters:  07/04/22 119/72         Passed - Last Heart Rate in normal range    Pulse Readings from Last 1 Encounters:  07/04/22 70         Passed - Valid encounter within last 6 months    Recent Outpatient Visits           1 month ago Hypertension associated with diabetes [facility])   [place] Jon Billings, NP   4 months ago Hypertension associated with diabetes [facility])   [place] Jon Billings, NP   5 months ago Viral upper respiratory tract infection   [place] Jon Billings, NP   6 months ago Hypertension associated with diabetes [facility])   [place] Jon Billings, NP   7 months ago Hypertension associated with diabetes [facility])   [place] Jon Billings, NP       Future Appointments             In 2 months Jon Billings, NP [place], [place]   In 2 months Jon Billings, NP [place], [facility]             valsartan (DIOVAN) 40 MG tablet [Pharmacy Med Name: VALSARTAN 40 MG TABLET] 90 tablet 0    Sig: TAKE 1 TABLET BY MOUTH EVERY DAY     Cardiovascular:  Angiotensin Receptor Blockers Passed - 08/03/2022  8:23 AM      Passed - Cr in normal range and within 180 days     Creatinine  Date Value Ref Range Status  01/22/2013 0.86 0.60 - 1.30 mg/dL Final   Creatinine, Ser  Date Value Ref Range Status  07/04/2022 0.90 0.57 - 1.00 mg/dL Final   Creatinine,U  Date Value Ref Range Status  09/10/2014 65.8 mg/dL Final         Passed - K in normal range and within 180 days    Potassium  Date Value Ref Range Status  07/04/2022 4.0 3.5 - 5.2 mmol/L Final  01/22/2013 3.1 (L) 3.5 - 5.1 mmol/L Final         Passed - Patient is not pregnant      Passed - Last BP in normal range    BP Readings from Last 1 Encounters:  07/04/22 119/72         Passed - Valid encounter within last 6 months    Recent Outpatient Visits           1 month ago Hypertension associated with diabetes ([place])  [place], NP   4 months ago Hypertension associated with diabetes [facility])   [place], NP   5 months ago Viral upper respiratory tract infection   [place], Karen, NP   6 months ago Hypertension associated with diabetes [facility])   [place], Karen, NP   7 months ago Hypertension associated with diabetes [facility])   [place] Jon Billings, NP       Future Appointments             In 2 months Jon Billings, NP [place], [place]   In 2 months Jon Billings, NP [place]

## 2022-08-11 ENCOUNTER — Other Ambulatory Visit: Payer: Self-pay | Admitting: Nurse Practitioner

## 2022-08-11 NOTE — Telephone Encounter (Signed)
Requested Prescriptions  Pending Prescriptions Disp Refills   glipiZIDE (GLUCOTROL) 5 MG tablet [Pharmacy Med Name: GLIPIZIDE 5 MG TABLET] 180 tablet 1    Sig: TAKE 1 TABLET BY MOUTH TWICE A DAY BEFORE A MEAL-NO MORE REFILLS WITHOUT OFFICE VISIT     Endocrinology:  Diabetes - Sulfonylureas Passed - 08/11/2022  2:33 AM      Passed - HBA1C is between 0 and 7.9 and within 180 days    HB A1C (BAYER DCA - WAIVED)  Date Value Ref Range Status  07/03/2020 7.5 (H) <7.0 % Final    Comment:                                          Diabetic Adult            <7.0                                       Healthy Adult        4.3 - 5.7                                                           (DCCT/NGSP) American Diabetes Association's Summary of Glycemic Recommendations for Adults with Diabetes: Hemoglobin A1c <7.0%. More stringent glycemic goals (A1c <6.0%) may further reduce complications at the cost of increased risk of hypoglycemia.    Hgb A1c MFr Bld  Date Value Ref Range Status  07/04/2022 6.5 (H) 4.8 - 5.6 % Final    Comment:             Prediabetes: 5.7 - 6.4          Diabetes: >6.4          Glycemic control for adults with diabetes: <7.0          Passed - Cr in normal range and within 360 days    Creatinine  Date Value Ref Range Status  01/22/2013 0.86 0.60 - 1.30 mg/dL Final   Creatinine, Ser  Date Value Ref Range Status  07/04/2022 0.90 0.57 - 1.00 mg/dL Final   Creatinine,U  Date Value Ref Range Status  09/10/2014 65.8 mg/dL Final         Passed - Valid encounter within last 6 months    Recent Outpatient Visits           1 month ago Hypertension associated with diabetes [facility])   [place], Karen, NP   4 months ago Hypertension associated with diabetes [facility])   [place], Karen, NP   5 months ago Viral upper respiratory tract infection   [place], Karen, NP    6 months ago Hypertension associated with diabetes [facility])   [place], Karen, NP   7 months ago Hypertension associated with diabetes [facility])   [place] Jon Billings, NP       Future Appointments             In 1 month Jon Billings, NP [place], [facility]  In 1 month Jon Billings, NP [place]

## 2022-09-26 ENCOUNTER — Other Ambulatory Visit: Payer: Self-pay | Admitting: Nurse Practitioner

## 2022-09-27 NOTE — Telephone Encounter (Signed)
Requested medications are due for refill today.  yes  Requested medications are on the active medications list.  yes  Last refill. 08/04/2022 30 g 0 rf  Future visit scheduled.   yes  Notes to clinic.  Refill not delegated.    Requested Prescriptions  Pending Prescriptions Disp Refills   triamcinolone cream (KENALOG) 0.1 % [Pharmacy Med Name: TRIAMCINOLONE 0.1% CREAM] 30 g 0    Sig: APPLY TO AFFECTED AREA TWICE A DAY     Not Delegated - Dermatology:  Corticosteroids Failed - 09/26/2022  2:58 PM      Failed - This refill cannot be delegated      Passed - Valid encounter within last 12 months    Recent Outpatient Visits           2 months ago Hypertension associated with diabetes [facility])   [place] [facility]rooms, NP   5 months ago Hypertension associated with diabetes [facility])   [facility] Larae Grooms, NP   7 months ago Viral upper respiratory tract infection   [place] [facility] Grooms, NP   8 months ago Hypertension associated with diabetes [facility])   [place] [facility] Larae Grooms, NP   9 months ago Hypertension associated with diabetes [facility])   [place] [facility] Larae Grooms, NP       Future Appointments             In 1 week Larae Grooms, NP [place] [facility]            Signed Prescriptions Disp Refills   metoprolol succinate (TOPROL-XL) 25 MG 24 hr tablet 90 tablet 1    Sig: TAKE 1 TABLET (25 MG TOTAL) BY MOUTH DAILY.     Cardiovascular:  Beta Blockers Passed - 09/26/2022  2:58 PM      Passed - Last BP in normal range    BP Readings from Last 1 Encounters:  07/04/22 119/72         Passed - Last Heart Rate in normal range    Pulse Readings from Last 1 Encounters:  07/04/22 70         Passed - Valid encounter within last 6 months    Recent Outpatient Visits           2 months ago Hypertension  associated with diabetes [facility])   [place] [facility], NP   5 months ago Hypertension associated with diabetes [facility])   [place] [facility] Grooms, NP   7 months ago Viral upper respiratory tract infection   [place] [facility] Larae Grooms, NP   8 months ago Hypertension associated with diabetes [facility])   [facility] Grooms, NP   9 months ago Hypertension associated with diabetes [facility])   [facility] Larae Grooms, NP       Future Appointments             In 1 week Larae Grooms, NP  [facility]             amLODipine (NORVASC) 10 MG tablet 90 tablet 1    Sig: TAKE 1 TABLET BY MOUTH EVERY DAY     Cardiovascular: Calcium Channel Blockers 2 Passed - 09/26/2022  2:58 PM      Passed - Last BP in normal range    BP Readings  from Last 1 Encounters:  07/04/22 119/72         Passed - Last Heart Rate in normal range    Pulse Readings from Last 1 Encounters:  07/04/22 70         Passed - Valid encounter within last 6 months    Recent Outpatient Visits           2 months ago Hypertension associated with diabetes [facility])   [place] [facility] Lar[facility] Grooms, NP   5 months ago Hypertension associated with diabetes [facility])   [place] Lar[facility] Grooms, NP   7 months ago Viral upper respiratory tract infection   [place] [facility] Larae Grooms, NP   8 months ago Hypertension associated with diabetes [facility])   [place] [facility] Larae Grooms, NP   9 months ago Hypertension associated with diabetes [facility])   [place] [facility] Larae Grooms, NP       Future Appointments             In 1 week Larae Grooms, NP [place] [facility]             hydrochlorothiazide  (HYDRODIURIL) 25 MG tablet 90 tablet 1    Sig: TAKE 1 TABLET (25 MG TOTAL) BY MOUTH DAILY.     Cardiovascular: Diuretics - Thiazide Passed - 09/26/2022  2:58 PM      Passed - Cr in normal range and within 180 days    Creatinine  Date Value Ref Range Status  01/22/2013 0.86 0.60 - 1.30 mg/dL Final   Creatinine, Ser  Date Value Ref Range Status  07/04/2022 0.90 0.57 - 1.00 mg/dL Final   Creatinine,U  Date Value Ref Range Status  09/10/2014 65.8 mg/dL Final         Passed - K in normal range and within 180 days    Potassium  Date Value Ref Range Status  07/04/2022 4.0 3.5 - 5.2 mmol/L Final  01/22/2013 3.1 (L) 3.5 - 5.1 mmol/L Final         Passed - Na in normal range and within 180 days    Sodium  Date Value Ref Range Status  07/04/2022 142 134 - 144 mmol/L Final  01/22/2013 134 (L) 136 - 145 mmol/L Final         Passed - Last BP in normal range    BP Readings from Last 1 Encounters:  07/04/22 119/72         Passed - Valid encounter within last 6 months    Recent Outpatient Visits           2 months ago Hypertension associated with diabetes [facility])   [place] Larae Grooms, NP   5 months ago Hypertension associated with diabetes [facility])   [place] [facility] Lar[facility] Grooms, NP   7 months ago Viral upper respiratory tract infection   [place] [facility], NP   8 months ago Hypertension associated with diabetes [facility])   [place] [facility], NP   9 months ago Hypertension associated with diabetes [employer])    [facility] Larae Grooms, NP       Future Appointments             In 1 week Larae Grooms, NP  [place] [facility]  potassium chloride SA (KLOR-CON M20) 20 MEQ tablet 180 tablet 1    Sig: TAKE 1 TABLET BY MOUTH TWICE A DAY     Endocrinology:  Minerals - Potassium  Supplementation Passed - 09/26/2022  2:58 PM      Passed - K in normal range and within 360 days    Potassium  Date Value Ref Range Status  07/04/2022 4.0 3.5 - 5.2 mmol/L Final  01/22/2013 3.1 (L) 3.5 - 5.1 mmol/L Final         Passed - Cr in normal range and within 360 days    Creatinine  Date Value Ref Range Status  01/22/2013 0.86 0.60 - 1.30 mg/dL Final   Creatinine, Ser  Date Value Ref Range Status  07/04/2022 0.90 0.57 - 1.00 mg/dL Final   Creatinine,U  Date Value Ref Range Status  09/10/2014 65.8 mg/dL Final         Passed - Valid encounter within last 12 months    Recent Outpatient Visits           2 months ago Hypertension associated with diabetes [facility])   [place] [facility], NP   5 months ago Hypertension associated with diabetes [facility])   [place] [facility] Lar[facility] Grooms, NP   7 months ago Viral upper respiratory tract infection   [place] [facility] Larae Grooms, NP   8 months ago Hypertension associated with diabetes [facility])   [place] [facility] Larae Grooms, NP   9 months ago Hypertension associated with diabetes [facility])   [place] [facility] Larae Grooms, NP       Future Appointments             In 1 week Larae Grooms, NP [place] [facility]             valsartan (DIOVAN) 40 MG tablet 90 tablet 1    Sig: TAKE 1 TABLET BY MOUTH EVERY DAY     Cardiovascular:  Angiotensin Receptor Blockers Passed - 09/26/2022  2:58 PM      Passed - Cr in normal range and within 180 days    Creatinine  Date Value Ref Range Status  01/22/2013 0.86 0.60 - 1.30 mg/dL Final   Creatinine, Ser  Date Value Ref Range Status  07/04/2022 0.90 0.57 - 1.00 mg/dL Final   Creatinine,U  Date Value Ref Range Status  09/10/2014 65.8 mg/dL Final         Passed - K in normal range and within 180 days    Potassium  Date Value Ref  Range Status  07/04/2022 4.0 3.5 - 5.2 mmol/L Final  01/22/2013 3.1 (L) 3.5 - 5.1 mmol/L Final         Passed - Patient is not pregnant      Passed - Last BP in normal range    BP Readings from Last 1 Encounters:  07/04/22 119/72         Passed - Valid encounter within last 6 months    Recent Outpatient Visits           2 months ago Hypertension associated with diabetes [facility])   [place] [facility] Larae Grooms, NP   5 months ago Hypertension associated with diabetes [facility])   [place] [facility] Larae Grooms, NP   7 months ago Viral upper respiratory tract infection   [place] [facility] Lar[facility] G[facility], NP   8  months ago Hypertension associated with diabetes [facility])   [place] [facility] Larae Grooms, NP   9 months ago Hypertension associated with diabetes [facility])   [place] [facility] Larae Grooms, NP       Future Appointments             In 1 week Larae Grooms, NP [place] [facility]

## 2022-09-27 NOTE — Telephone Encounter (Signed)
Requested Prescriptions  Pending Prescriptions Disp Refills   metoprolol succinate (TOPROL-XL) 25 MG 24 hr tablet [Pharmacy Med Name: METOPROLOL SUCC ER 25 MG TAB] 90 tablet 1    Sig: TAKE 1 TABLET (25 MG TOTAL) BY MOUTH DAILY.     Cardiovascular:  Beta Blockers Passed - 09/26/2022  2:58 PM      Passed - Last BP in normal range    BP Readings from Last 1 Encounters:  07/04/22 119/72         Passed - Last Heart Rate in normal range    Pulse Readings from Last 1 Encounters:  07/04/22 70         Passed - Valid encounter within last 6 months    Recent Outpatient Visits           2 months ago Hypertension associated with diabetes [facility])   [place] [facility] Larae Grooms, NP   5 months ago Hypertension associated with diabetes [facility])   [place] [facility] Larae Grooms, NP   7 months ago Viral upper respiratory tract infection   [place] [facility] Larae Grooms, NP   8 months ago Hypertension associated with diabetes [facility])   [place] G[facility], NP   9 months ago Hypertension associated with diabetes [facility])   [place] [facility] Larae Grooms, NP       Future Appointments             In 1 week Larae Grooms, NP [place] [facility]             amLODipine (NORVASC) 10 MG tablet [Pharmacy Med Name: AMLODIPINE BESYLATE 10 MG TAB] 90 tablet 1    Sig: TAKE 1 TABLET BY MOUTH EVERY DAY     Cardiovascular: Calcium Channel Blockers 2 Passed - 09/26/2022  2:58 PM      Passed - Last BP in normal range    BP Readings from Last 1 Encounters:  07/04/22 119/72         Passed - Last Heart Rate in normal range    Pulse Readings from Last 1 Encounters:  07/04/22 70         Passed - Valid encounter within last 6 months    Recent Outpatient Visits           2 months ago Hypertension associated with diabetes [facility])   [place] [facility] Larae Grooms, NP   5 months ago Hypertension associated with diabetes [facility])   [place] [facility] Larae Grooms, NP   7 months ago Viral upper respiratory tract infection   [place] [facility] Larae Grooms, NP   8 months ago Hypertension associated with diabetes [facility])   [place] [facility] Larae Grooms, NP   9 months ago Hypertension associated with diabetes [facility])   [place] [facility] Larae Grooms, NP       Future Appointments             In 1 week Larae Grooms, NP [place] [facility]             hydrochlorothiazide (HYDRODIURIL) 25 MG tablet [Pharmacy Med Name: HYDROCHLOROTHIAZIDE 25 MG TAB] 90 tablet 1    Sig: TAKE 1 TABLET (25 MG TOTAL) BY MOUTH DAILY.     Cardiovascular: Diuretics - Thiazide Passed - 09/26/2022  2:58 PM      Passed -  Cr in normal range and within 180 days    Creatinine  Date Value Ref Range Status  01/22/2013 0.86 0.60 - 1.30 mg/dL Final   Creatinine, Ser  Date Value Ref Range Status  07/04/2022 0.90 0.57 - 1.00 mg/dL Final   Creatinine,U  Date Value Ref Range Status  09/10/2014 65.8 mg/dL Final         Passed - K in normal range and within 180 days    Potassium  Date Value Ref Range Status  07/04/2022 4.0 3.5 - 5.2 mmol/L Final  01/22/2013 3.1 (L) 3.5 - 5.1 mmol/L Final         Passed - Na in normal range and within 180 days    Sodium  Date Value Ref Range Status  07/04/2022 142 134 - 144 mmol/L Final  01/22/2013 134 (L) 136 - 145 mmol/L Final         Passed - Last BP in normal range    BP Readings from Last 1 Encounters:  07/04/22 119/72         Passed - Valid encounter within last 6 months    Recent Outpatient Visits           2 months ago Hypertension associated with diabetes [facility])   [place] [facility] Larae Grooms, NP   5 months ago Hypertension associated with diabetes  [facility])   [place] [facility] Larae Grooms, NP   7 months ago Viral upper respiratory tract infection   [place] [facility] Larae Grooms, NP   8 months ago Hypertension associated with diabetes [facility])   [place] [facility] Larae Grooms, NP   9 months ago Hypertension associated with diabetes [facility])   [place] [facility] Larae Grooms, NP       Future Appointments             In 1 week Larae Grooms, NP [place] [facility]             KLOR-CON M20 20 MEQ tablet [Pharmacy Med Name: KLOR-CON M20 TABLET] 180 tablet 1    Sig: TAKE 1 TABLET BY MOUTH TWICE A DAY     Endocrinology:  Minerals - Potassium Supplementation Passed - 09/26/2022  2:58 PM      Passed - K in normal range and within 360 days    Potassium  Date Value Ref Range Status  07/04/2022 4.0 3.5 - 5.2 mmol/L Final  01/22/2013 3.1 (L) 3.5 - 5.1 mmol/L Final         Passed - Cr in normal range and within 360 days    Creatinine  Date Value Ref Range Status  01/22/2013 0.86 0.60 - 1.30 mg/dL Final   Creatinine, Ser  Date Value Ref Range Status  07/04/2022 0.90 0.57 - 1.00 mg/dL Final   Creatinine,U  Date Value Ref Range Status  09/10/2014 65.8 mg/dL Final         Passed - Valid encounter within last 12 months    Recent Outpatient Visits           2 months ago Hypertension associated with diabetes [facility])   [place] Larae Grooms, NP   5 months ago Hypertension associated with diabetes [facility])   [place] [facility] Larae Grooms, NP   7 months ago Viral upper respiratory tract infection   [place] [facility] Larae Grooms, NP   8 months ago Hypertension associated with  diabetes [facility])   [place] [facility] Larae Grooms, NP   9 months ago Hypertension associated with diabetes [facility])   [place] [facility] Larae Grooms, NP       Future Appointments             In 1 week Larae Grooms, NP [place] [facility]             valsartan (DIOVAN) 40 MG tablet [Pharmacy Med Name: VALSARTAN 40 MG TABLET] 90 tablet 1    Sig: TAKE 1 TABLET BY MOUTH EVERY DAY     Cardiovascular:  Angiotensin Receptor Blockers Passed - 09/26/2022  2:58 PM      Passed - Cr in normal range and within 180 days    Creatinine  Date Value Ref Range Status  01/22/2013 0.86 0.60 - 1.30 mg/dL Final   Creatinine, Ser  Date Value Ref Range Status  07/04/2022 0.90 0.57 - 1.00 mg/dL Final   Creatinine,U  Date Value Ref Range Status  09/10/2014 65.8 mg/dL Final         Passed - K in normal range and within 180 days    Potassium  Date Value Ref Range Status  07/04/2022 4.0 3.5 - 5.2 mmol/L Final  01/22/2013 3.1 (L) 3.5 - 5.1 mmol/L Final         Passed - Patient is not pregnant      Passed - Last BP in normal range    BP Readings from Last 1 Encounters:  07/04/22 119/72         Passed - Valid encounter within last 6 months    Recent Outpatient Visits           2 months ago Hypertension associated with diabetes [facility])   [place] [facility] Larae Grooms, NP   5 months ago Hypertension associated with diabetes [facility])   [place] [facility] Larae Grooms, NP   7 months ago Viral upper respiratory tract infection   [place] [facility] Lar[facility] G[facility], NP   8 months ago Hypertension associated with diabetes [facility])   [place] [facility] Larae Grooms, NP   9 months ago Hypertension associated with diabetes [facility])   [place] [facility] Larae Grooms, NP       Future Appointments             In 1 week Larae Grooms, NP [place] [facility]             triamcinolone cream (KENALOG) 0.1 % [Pharmacy Med Name: TRIAMCINOLONE 0.1% CREAM] 30 g 0     Sig: APPLY TO AFFECTED AREA TWICE A DAY     Not Delegated - Dermatology:  Corticosteroids Failed - 09/26/2022  2:58 PM      Failed - This refill cannot be delegated      Passed - Valid encounter within last 12 months    Recent Outpatient Visits           2 months ago Hypertension associated with diabetes [facility])   [place] [facility] Larae Grooms, NP   5 months ago Hypertension associated with diabetes [facility])   [place] [facility] Larae Grooms, NP   7 months ago Viral upper respiratory tract infection   [place] [facility] Larae Grooms, NP   8 months ago Hypertension associated with diabetes [facility])    [facility],  NP   9 months ago Hypertension associated with diabetes [facility])   [place] [facility] Larae Grooms, NP       Future Appointments             In 1 week Larae Grooms, NP [place] [facility]

## 2022-10-04 ENCOUNTER — Ambulatory Visit: Payer: HMO | Admitting: Nurse Practitioner

## 2022-10-04 ENCOUNTER — Encounter: Payer: Self-pay | Admitting: Nurse Practitioner

## 2022-10-04 ENCOUNTER — Ambulatory Visit (INDEPENDENT_AMBULATORY_CARE_PROVIDER_SITE_OTHER): Payer: PPO | Admitting: Nurse Practitioner

## 2022-10-04 VITALS — BP 133/87 | HR 54 | Temp 98.2°F | Ht 65.75 in | Wt 211.0 lb

## 2022-10-04 DIAGNOSIS — E1159 Type 2 diabetes mellitus with other circulatory complications: Secondary | ICD-10-CM

## 2022-10-04 DIAGNOSIS — Z0000 Encounter for general adult medical examination without abnormal findings: Secondary | ICD-10-CM

## 2022-10-04 DIAGNOSIS — E785 Hyperlipidemia, unspecified: Secondary | ICD-10-CM

## 2022-10-04 DIAGNOSIS — E1169 Type 2 diabetes mellitus with other specified complication: Secondary | ICD-10-CM | POA: Diagnosis not present

## 2022-10-04 DIAGNOSIS — E669 Obesity, unspecified: Secondary | ICD-10-CM | POA: Diagnosis not present

## 2022-10-04 DIAGNOSIS — Z7984 Long term (current) use of oral hypoglycemic drugs: Secondary | ICD-10-CM

## 2022-10-04 DIAGNOSIS — I152 Hypertension secondary to endocrine disorders: Secondary | ICD-10-CM | POA: Diagnosis not present

## 2022-10-04 DIAGNOSIS — E559 Vitamin D deficiency, unspecified: Secondary | ICD-10-CM | POA: Diagnosis not present

## 2022-10-04 LAB — MICROALBUMIN, URINE WAIVED
Creatinine, Urine Waived: 200 mg/dL (ref 10–300)
Microalb, Ur Waived: 30 mg/L — ABNORMAL HIGH (ref 0–19)
Microalb/Creat Ratio: <30 mg/g (ref <30)

## 2022-10-04 MED ORDER — ROSUVASTATIN CALCIUM 5 MG PO TABS: 5.0000 mg | ORAL_TABLET | Freq: Every day | ORAL | 1 refills | Status: DC

## 2022-10-04 NOTE — Assessment & Plan Note (Signed)
Chronic, stable. Labs ordered today. Continue current regimen of Glipizide 5mg  daily.  A1c controlled at 6.5%.  Patient would rather work on diet than add medication.  Continue with diet changes.  Requested Eye exam.  Dr. Alvester Morin in [place].  Follow up in 6 months.  Call sooner if concerns arise.

## 2022-10-04 NOTE — Assessment & Plan Note (Signed)
Labs ordered at visit today.  Will make recommendations based on lab results.   

## 2022-10-04 NOTE — Progress Notes (Signed)
BP 133/87   Pulse (!) 54   Temp 98.2 F (36.8 C) (Oral)   Ht 5' 5.75" (1.67 m)   Wt 211 lb (95.7 kg)   LMP  (LMP Unknown)   SpO2 98%   BMI 34.32 kg/m    Subjective:    Patient ID: Hannah Calhoun, female    DOB: 1961-01-30, 62 y.o.   MRN: [id number]  HPI: Hannah Calhoun is a 62 y.o. female presenting on 10/04/2022 for comprehensive medical examination. Current medical complaints include:none  She currently lives with: Menopausal Symptoms: no  HYPERTENSION / HYPERLIPIDEMIA Satisfied with current treatment? yes Duration of hypertension: years BP monitoring frequency: not checking BP range:  BP medication side effects: no Past BP Metoprolol 25mg , amlodipine, and HCTZ meds:  Duration of hyperlipidemia: years Cholesterol medication side effects: no Cholesterol supplements: none Past cholesterol medications: rosuvastatin (crestor)- hasn't been taking for the last 5 months Medication compliance: excellent compliance Aspirin: no Recent stressors: no Recurrent headaches: no Visual changes: no Palpitations: no Dyspnea: no Chest pain: no Lower extremity edema: no Dizzy/lightheaded: no   DIABETES Hypoglycemic episodes:no Polydipsia/polyuria: no Visual disturbance: no Chest pain: no Paresthesias: no Glucose Monitoring: no  Accucheck frequency: not checking  Fasting glucose:   Post prandial:  Evening:  Before meals: Taking Insulin?: no  Long acting insulin:  Short acting insulin: Blood Pressure Monitoring: not checking Retinal Examination: Up to Date Foot Exam: Up to Date Diabetic Education: Not Completed Pneumovax: Up to Date Influenza: Up to Date Aspirin: no  MOOD Patient states she is doing well.  She denies concerns at visit today.   Depression Screen done today and results listed below:     10/04/2022    9:19 AM 07/04/2022    8:18 AM 06/27/2022   11:37 AM 04/05/2022    9:46 AM 02/23/2022    1:11 PM  Depression screen PHQ 2/9  Decreased  Interest 0 0 0 0 0  Down, Depressed, Hopeless 0 0 0 0 0  PHQ - 2 Score 0 0 0 0 0  Altered sleeping 0 0 0 0 0  Tired, decreased energy 0 0 0 0 0  Change in appetite 0 0 0 0 1  Feeling bad or failure about yourself  0 0 0 0 0  Trouble concentrating 0 0 0 0 0  Moving slowly or fidgety/restless 0 0 0 0 0  Suicidal thoughts 0 0 0 0 0  PHQ-9 Score 0 0 0 0 1  Difficult doing work/chores Not difficult at all Not difficult at all Not difficult at all Not difficult at all Not difficult at all    The patient does not have a history of falls. I did complete a risk assessment for falls. A plan of care for falls was documented.   Past Medical History:  Past Medical History:  Diagnosis Date   Allergy    Arthritis    Chicken pox    Depression    Diabetes mellitus without complication (HCC)    Diverticulitis    GERD (gastroesophageal reflux disease)    History of colon polyps    Hyperlipidemia    Hypertension     Surgical History:  Past Surgical History:  Procedure Laterality Date   BREAST BIOPSY Right 1990   EXCISIONAL - NEG   BREAST SURGERY Right 1990   CHOLECYSTECTOMY     COLONOSCOPY WITH PROPOFOL N/A 02/04/2022   Procedure: COLONOSCOPY WITH PROPOFOL;  Surgeon: Wyline Mood, MD;  Location: [facility] ENDOSCOPY;  Service: Gastroenterology;  Laterality: N/A;   JOINT REPLACEMENT     KNEE ARTHROSCOPY  2010   NASAL SEPTUM SURGERY  2010   TONSILLECTOMY     TOTAL ABDOMINAL HYSTERECTOMY  2009   TOTAL HIP ARTHROPLASTY      Medications:  Current Outpatient Medications on File Prior to Visit  Medication Sig   acetaminophen (TYLENOL) 500 MG tablet Take 650 mg by mouth 2 (two) times daily.   amLODipine (NORVASC) 10 MG tablet TAKE 1 TABLET BY MOUTH EVERY DAY   gabapentin (NEURONTIN) 300 MG capsule TAKE 1 CAPSULE BY MOUTH THREE TIMES A DAY   GARLIC PO Take by mouth daily.   glipiZIDE (GLUCOTROL) 5 MG tablet TAKE 1 TABLET BY MOUTH TWICE A DAY BEFORE A MEAL-NO MORE REFILLS WITHOUT OFFICE VISIT    hydrochlorothiazide (HYDRODIURIL) 25 MG tablet TAKE 1 TABLET (25 MG TOTAL) BY MOUTH DAILY.   metoprolol succinate (TOPROL-XL) 25 MG 24 hr tablet TAKE 1 TABLET (25 MG TOTAL) BY MOUTH DAILY.   Multiple Vitamin (MULTI-VITAMIN) tablet Take 1 tablet by mouth daily.   potassium chloride SA (KLOR-CON M20) 20 MEQ tablet TAKE 1 TABLET BY MOUTH TWICE A DAY   triamcinolone cream (KENALOG) 0.1 % APPLY TO AFFECTED AREA TWICE A DAY   valsartan (DIOVAN) 40 MG tablet TAKE 1 TABLET BY MOUTH EVERY DAY   Vitamin D, Ergocalciferol, (DRISDOL) 1.25 MG (50000 UNIT) CAPS capsule Take 1 capsule (50,000 Units total) by mouth every 7 (seven) days.   No current facility-administered medications on file prior to visit.    Allergies:  Allergies  Allergen Reactions   Crestor [Rosuvastatin] Other (See Comments)    myalgia   Doxycycline Diarrhea and Nausea And Vomiting   Nsaids Other (See Comments)    Bleeding ulcer   Other Other (See Comments)   Tolmetin     Other reaction(s): Other (See Comments) Bleeding ulcer   Ace Inhibitors Other (See Comments) and Rash    Significant kidney insufficiency-  each time lisinopril restarted Significant AKI each time lisinopril restarted    Aspirin Rash    Bleeding ulcers    Social History:  Social History   Socioeconomic History   Marital status: Married    Spouse name: Not on file   Number of children: Not on file   Years of education: Not on file   Highest education level: Not on file  Occupational History   Not on file  Tobacco Use   Smoking status: Former    Packs/day: 0.50    Years: 15.00    Additional pack years: 0.00    Total pack years: 7.50    Types: Cigarettes    Quit date: 06/12/2005    Years since quitting: 17.3   Smokeless tobacco: Never   Tobacco comments:    quit 2007  Vaping Use   Vaping Use: Never used  Substance and Sexual Activity   Alcohol use: Not Currently    Alcohol/week: 0.0 standard drinks of alcohol   Drug use: No   Sexual  activity: Not Currently  Other Topics Concern   Not on file  Social History Narrative   Not on file   Social Determinants of Health   Financial Resource Strain: Low Risk  (06/27/2022)   Overall Financial Resource Strain (CARDIA)    Difficulty of Paying Living Expenses: Not hard at all  Food Insecurity: No Food Insecurity (06/27/2022)   Hunger Vital Sign    Worried About Running Out of Food in the Last Year: Never true  Ran Out of Food in the Last Year: Never true  Transportation Needs: No Transportation Needs (06/27/2022)   PRAPARE - Administrator, Civil Service (Medical): No    Lack of Transportation (Non-Medical): No  Physical Activity: Insufficiently Active (06/27/2022)   Exercise Vital Sign    Days of Exercise per Week: 3 days    Minutes of Exercise per Session: 30 min  Stress: No Stress Concern Present (06/27/2022)   [employer] of Occupational Health - Occupational Stress Questionnaire    Feeling of Stress : Only a little  Social Connections: Moderately Integrated (06/27/2022)   Social Connection and Isolation Panel [NHANES]    Frequency of Communication with Friends and Family: More than three times a week    Frequency of Social Gatherings with Friends and Family: More than three times a week    Attends Religious Services: Never    Database administrator or Organizations: Yes    Attends Engineer, structural: More than 4 times per year    Marital Status: Married  Catering manager Violence: Not At Risk (06/27/2022)   Humiliation, Afraid, Rape, and Kick questionnaire    Fear of Current or Ex-Partner: No    Emotionally Abused: No    Physically Abused: No    Sexually Abused: No   Social History   Tobacco Use  Smoking Status Former   Packs/day: 0.50   Years: 15.00   Additional pack years: 0.00   Total pack years: 7.50   Types: Cigarettes   Quit date: 06/12/2005   Years since quitting: 17.3  Smokeless Tobacco Never  Tobacco Comments   quit  2007   Social History   Substance and Sexual Activity  Alcohol Use Not Currently   Alcohol/week: 0.0 standard drinks of alcohol    Family History:  Family History  Problem Relation Age of Onset   Alcohol abuse Mother    Hyperlipidemia Mother    Heart disease Mother    Hypertension Mother    Heart attack Mother    Alcohol abuse Father    Hyperlipidemia Father    Heart disease Father    Hypertension Father    Heart attack Father    Alcohol abuse Maternal Aunt    Hypertension Maternal Aunt    Kidney disease Maternal Aunt    Alcohol abuse Maternal Uncle    Hypertension Maternal Uncle    Alcohol abuse Paternal Aunt    Birth defects Paternal Aunt        Colon, Breast, Lung   Heart disease Paternal Aunt    Breast cancer Paternal Aunt    Alcohol abuse Paternal Uncle    Heart disease Paternal Uncle    Stroke Maternal Grandmother    Hypertension Maternal Grandmother    Alcohol abuse Maternal Grandfather    Heart disease Maternal Grandfather    Hypertension Maternal Grandfather     Past medical history, surgical history, medications, allergies, family history and social history reviewed with patient today and changes made to appropriate areas of the chart.   Review of Systems  HENT:         Denies vision changes.  Eyes:  Negative for blurred vision and double vision.  Respiratory:  Negative for shortness of breath.   Cardiovascular:  Negative for chest pain, palpitations and leg swelling.  Neurological:  Negative for dizziness, tingling and headaches.  Endo/Heme/Allergies:  Negative for polydipsia.       Denies Polyuria  Psychiatric/Behavioral:  Negative for depression. The patient is  not nervous/anxious.    All other ROS negative except what is listed above and in the HPI.      Objective:    BP 133/87   Pulse (!) 54   Temp 98.2 F (36.8 C) (Oral)   Ht 5' 5.75" (1.67 m)   Wt 211 lb (95.7 kg)   LMP  (LMP Unknown)   SpO2 98%   BMI 34.32 kg/m   Wt Readings from  Last 3 Encounters:  10/04/22 211 lb (95.7 kg)  07/04/22 206 lb 8 oz (93.7 kg)  06/27/22 197 lb (89.4 kg)    Physical Exam Vitals and nursing note reviewed.  Constitutional:      General: She is awake. She is not in acute distress.    Appearance: Normal appearance. She is well-developed. She is obese. She is not ill-appearing.  HENT:     Head: Normocephalic and atraumatic.     Right Ear: Hearing, tympanic membrane, ear canal and external ear normal. No drainage.     Left Ear: Hearing, tympanic membrane, ear canal and external ear normal. No drainage.     Nose: Nose normal.     Right Sinus: No maxillary sinus tenderness or frontal sinus tenderness.     Left Sinus: No maxillary sinus tenderness or frontal sinus tenderness.     Mouth/Throat:     Mouth: Mucous membranes are moist.     Pharynx: Oropharynx is clear. Uvula midline. No pharyngeal swelling, oropharyngeal exudate or posterior oropharyngeal erythema.  Eyes:     General: Lids are normal.        Right eye: No discharge.        Left eye: No discharge.     Extraocular Movements: Extraocular movements intact.     Conjunctiva/sclera: Conjunctivae normal.     Pupils: Pupils are equal, round, and reactive to light.     Visual Fields: Right eye visual fields normal and left eye visual fields normal.  Neck:     Thyroid: No thyromegaly.     Vascular: No carotid bruit.     Trachea: Trachea normal.  Cardiovascular:     Rate and Rhythm: Normal rate and regular rhythm.     Heart sounds: Normal heart sounds. No murmur heard.    No gallop.  Pulmonary:     Effort: Pulmonary effort is normal. No accessory muscle usage or respiratory distress.     Breath sounds: Normal breath sounds.  Chest:  Breasts:    Right: Normal.     Left: Normal.  Abdominal:     General: Bowel sounds are normal.     Palpations: Abdomen is soft. There is no hepatomegaly or splenomegaly.     Tenderness: There is no abdominal tenderness.  Musculoskeletal:         General: Normal range of motion.     Cervical back: Normal range of motion and neck supple.     Right lower leg: No edema.     Left lower leg: No edema.  Lymphadenopathy:     Head:     Right side of head: No submental, submandibular, tonsillar, preauricular or posterior auricular adenopathy.     Left side of head: No submental, submandibular, tonsillar, preauricular or posterior auricular adenopathy.     Cervical: No cervical adenopathy.     Upper Body:     Right upper body: No supraclavicular, axillary or pectoral adenopathy.     Left upper body: No supraclavicular, axillary or pectoral adenopathy.  Skin:    General: Skin is  warm and dry.     Capillary Refill: Capillary refill takes less than 2 seconds.     Findings: No rash.  Neurological:     Mental Status: She is alert and oriented to person, place, and time.     Gait: Gait is intact.     Deep Tendon Reflexes: Reflexes are normal and symmetric.     Reflex Scores:      Brachioradialis reflexes are 2+ on the right side and 2+ on the left side.      Patellar reflexes are 2+ on the right side and 2+ on the left side. Psychiatric:        Attention and Perception: Attention normal.        Mood and Affect: Mood normal.        Speech: Speech normal.        Behavior: Behavior normal. Behavior is cooperative.        Thought Content: Thought content normal.        Judgment: Judgment normal.     Results for orders placed or performed in visit on 07/04/22  Lipid panel  Result Value Ref Range   Cholesterol, Total 241 (H) 100 - 199 mg/dL   Triglycerides 161 0 - 149 mg/dL   HDL 66 >09 mg/dL   VLDL Cholesterol Cal 26 5 - 40 mg/dL   LDL Chol Calc (NIH) 604 (H) 0 - 99 mg/dL   Chol/HDL Ratio 3.7 0.0 - 4.4 ratio  Comprehensive metabolic panel  Result Value Ref Range   Glucose 119 (H) 70 - 99 mg/dL   BUN 11 6 - 24 mg/dL   Creatinine, Ser 5.40 0.57 - 1.00 mg/dL   eGFR 74 >98 JX/BJY/7.82   BUN/Creatinine Ratio 12 9 - 23   Sodium 142 134  - 144 mmol/L   Potassium 4.0 3.5 - 5.2 mmol/L   Chloride 103 96 - 106 mmol/L   CO2 25 20 - 29 mmol/L   Calcium 9.6 8.7 - 10.2 mg/dL   Total Protein 6.8 6.0 - 8.5 g/dL   Albumin 4.5 3.8 - 4.9 g/dL   Globulin, Total 2.3 1.5 - 4.5 g/dL   Albumin/Globulin Ratio 2.0 1.2 - 2.2   Bilirubin Total 0.3 0.0 - 1.2 mg/dL   Alkaline Phosphatase 59 44 - 121 IU/L   AST 18 0 - 40 IU/L   ALT 20 0 - 32 IU/L  Vitamin D (25 hydroxy)  Result Value Ref Range   Vit D, 25-Hydroxy 43.1 30.0 - 100.0 ng/mL  HgB A1c  Result Value Ref Range   Hgb A1c MFr Bld 6.5 (H) 4.8 - 5.6 %   Est. average glucose Bld gHb Est-mCnc 140 mg/dL      Assessment & Plan:   Problem List Items Addressed This Visit       Cardiovascular and Mediastinum   Hypertension associated with diabetes (HCC)    Chronic.  Controlled. Continue with amlodipine, HCTZ, metoprolol and Valsartan 40mg  daily.  Up to date on refills.  Labs ordered today.  Follow up in 6 months for reevaluation.  Call sooner if concerns arise.       Relevant Medications   rosuvastatin (CRESTOR) 5 MG tablet   Other Relevant Orders   Comp Met (CMET)     Endocrine   Type 2 diabetes mellitus with other specified complication (HCC)    Chronic, stable. Labs ordered today. Continue current regimen of Glipizide 5mg  daily.  A1c controlled at 6.5%.  Patient would rather work on diet than  add medication.  Continue with diet changes.  Requested Eye exam.  Dr. Alvester Morin in [place].  Follow up in 6 months.  Call sooner if concerns arise.      Relevant Medications   rosuvastatin (CRESTOR) 5 MG tablet   Other Relevant Orders   HgB A1c   Comp Met (CMET)   Microalbumin, Urine Waived   Hyperlipidemia associated with type 2 diabetes mellitus (HCC)    Chronic.  Will restart Rosuvastatin.  Can take weekly if she able to tolerate it.  Labs ordered today.  Return to clinic in 6 months for reevaluation.  Call sooner if concerns arise.        Relevant Medications   rosuvastatin  (CRESTOR) 5 MG tablet   Other Relevant Orders   Lipid Profile     Other   Obesity (BMI 30-39.9)    Recommended eating smaller high protein, low fat meals more frequently and exercising 30 mins a day 5 times a week with a goal of 10-15lb weight loss in the next 3 months.       Vitamin D deficiency    Labs ordered at visit today.  Will make recommendations based on lab results.        Relevant Orders   Vitamin D (25 hydroxy)   Other Visit Diagnoses     Annual physical exam    -  Primary   Health maintenance reviewed during visit today.  Labs ordered.  Vaccines up to date.  Colonoscopy and Mammogram up to date.        Follow up plan: Return in about 6 months (around 04/06/2023) for HTN, HLD, DM2 FU.   LABORATORY TESTING:  - Pap smear: not applicable  IMMUNIZATIONS:   - Tdap: Tetanus vaccination status reviewed: last tetanus booster within 10 years. - Influenza: Up to date - Pneumovax: Up to date - Prevnar: Up to date - COVID: Up to date - HPV: Not applicable - Shingrix vaccine:  Discussed at visit today  SCREENING: -Mammogram: Up to date  - Colonoscopy: Up to date  - Bone Density: Not applicable  -Hearing Test: Not applicable  -Spirometry: Not applicable   PATIENT COUNSELING:   Advised to take 1 mg of folate supplement per day if capable of pregnancy.   Sexuality: Discussed sexually transmitted diseases, partner selection, use of condoms, avoidance of unintended pregnancy  and contraceptive alternatives.   Advised to avoid cigarette smoking.  I discussed with the patient that most people either abstain from alcohol or drink within safe limits (<=14/week and <=4 drinks/occasion for males, <=7/weeks and <= 3 drinks/occasion for females) and that the risk for alcohol disorders and other health effects rises proportionally with the number of drinks per week and how often a drinker exceeds daily limits.  Discussed cessation/primary prevention of drug use and  availability of treatment for abuse.   Diet: Encouraged to adjust caloric intake to maintain  or achieve ideal body weight, to reduce intake of dietary saturated fat and total fat, to limit sodium intake by avoiding high sodium foods and not adding table salt, and to maintain adequate dietary potassium and calcium preferably from fresh fruits, vegetables, and low-fat dairy products.    stressed the importance of regular exercise  Injury prevention: Discussed safety belts, safety helmets, smoke detector, smoking near bedding or upholstery.   Dental health: Discussed importance of regular tooth brushing, flossing, and dental visits.    NEXT PREVENTATIVE PHYSICAL DUE IN 1 YEAR. Return in about 6 months (around 04/06/2023) for  HTN, HLD, DM2 FU.

## 2022-10-04 NOTE — Assessment & Plan Note (Signed)
Chronic.  Will restart Rosuvastatin.  Can take weekly if she able to tolerate it.  Labs ordered today.  Return to clinic in 6 months for reevaluation.  Call sooner if concerns arise.

## 2022-10-04 NOTE — Assessment & Plan Note (Signed)
Chronic.  Controlled. Continue with amlodipine, HCTZ, metoprolol and Valsartan 40mg  daily.  Up to date on refills.  Labs ordered today.  Follow up in 6 months for reevaluation.  Call sooner if concerns arise.

## 2022-10-04 NOTE — Assessment & Plan Note (Signed)
Recommended eating smaller high protein, low fat meals more frequently and exercising 30 mins a day 5 times a week with a goal of 10-15lb weight loss in the next 3 months.  

## 2022-10-05 ENCOUNTER — Telehealth: Payer: Self-pay

## 2022-10-05 LAB — LIPID PANEL
Chol/HDL Ratio: 3.8 ratio (ref 0.0–4.4)
Cholesterol, Total: 225 mg/dL — ABNORMAL HIGH (ref 100–199)
HDL: 60 mg/dL (ref >39)
LDL Chol Calc (NIH): 141 mg/dL — ABNORMAL HIGH (ref 0–99)
Triglycerides: 135 mg/dL (ref 0–149)
VLDL Cholesterol Cal: 24 mg/dL (ref 5–40)

## 2022-10-05 LAB — COMPREHENSIVE METABOLIC PANEL
ALT: 21 IU/L (ref 0–32)
AST: 20 IU/L (ref 0–40)
Albumin/Globulin Ratio: 1.6 (ref 1.2–2.2)
Albumin: 4.2 g/dL (ref 3.8–4.9)
Alkaline Phosphatase: 64 IU/L (ref 44–121)
BUN/Creatinine Ratio: 14 (ref 9–23)
BUN: 13 mg/dL (ref 6–24)
Bilirubin Total: 0.2 mg/dL (ref 0.0–1.2)
CO2: 24 mmol/L (ref 20–29)
Calcium: 9.9 mg/dL (ref 8.7–10.2)
Chloride: 103 mmol/L (ref 96–106)
Creatinine, Ser: 0.9 mg/dL (ref 0.57–1.00)
Globulin, Total: 2.7 g/dL (ref 1.5–4.5)
Glucose: 114 mg/dL — ABNORMAL HIGH (ref 70–99)
Potassium: 3.8 mmol/L (ref 3.5–5.2)
Sodium: 142 mmol/L (ref 134–144)
Total Protein: 6.9 g/dL (ref 6.0–8.5)
eGFR: 74 mL/min/{1.73_m2} (ref >59)

## 2022-10-05 LAB — HEMOGLOBIN A1C
Est. average glucose Bld gHb Est-mCnc: 146 mg/dL
Hgb A1c MFr Bld: 6.7 % — ABNORMAL HIGH (ref 4.8–5.6)

## 2022-10-05 LAB — VITAMIN D 25 HYDROXY (VIT D DEFICIENCY, FRACTURES): Vit D, 25-Hydroxy: 38.7 ng/mL (ref 30.0–100.0)

## 2022-10-05 NOTE — Telephone Encounter (Signed)
See mychart message. Patient requested vitamin D3 2000 units to be added to be medication list.

## 2022-10-05 NOTE — Progress Notes (Signed)
HI [name]. It was nice to see you yesterday.  Your lab work looks good.  A1c is still well controlled at 6.7%.  Continue with a low carb diet.  No concerns at this time. Continue with your current medication regimen.  Follow up as discussed.  Please let me know if you have any questions.

## 2022-10-14 ENCOUNTER — Telehealth: Payer: Self-pay

## 2022-10-14 ENCOUNTER — Ambulatory Visit (INDEPENDENT_AMBULATORY_CARE_PROVIDER_SITE_OTHER): Payer: PPO | Admitting: Physician Assistant

## 2022-10-14 ENCOUNTER — Encounter: Payer: Self-pay | Admitting: Physician Assistant

## 2022-10-14 VITALS — BP 129/68 | HR 62 | Temp 98.7°F | Wt 212.0 lb

## 2022-10-14 DIAGNOSIS — N760 Acute vaginitis: Secondary | ICD-10-CM | POA: Diagnosis not present

## 2022-10-14 DIAGNOSIS — B9689 Other specified bacterial agents as the cause of diseases classified elsewhere: Secondary | ICD-10-CM

## 2022-10-14 DIAGNOSIS — R109 Unspecified abdominal pain: Secondary | ICD-10-CM | POA: Diagnosis not present

## 2022-10-14 DIAGNOSIS — R1032 Left lower quadrant pain: Secondary | ICD-10-CM

## 2022-10-14 DIAGNOSIS — B3731 Acute candidiasis of vulva and vagina: Secondary | ICD-10-CM | POA: Diagnosis not present

## 2022-10-14 LAB — WET PREP FOR TRICH, YEAST, CLUE
Clue Cell Exam: POSITIVE — AB
Trichomonas Exam: NEGATIVE
Yeast Exam: POSITIVE — AB

## 2022-10-14 LAB — URINALYSIS, ROUTINE W REFLEX MICROSCOPIC
Bilirubin, UA: NEGATIVE
Glucose, UA: NEGATIVE
Ketones, UA: NEGATIVE
Leukocytes,UA: NEGATIVE
Nitrite, UA: NEGATIVE
Protein,UA: NEGATIVE
RBC, UA: NEGATIVE
Specific Gravity, UA: 1.015 (ref 1.005–1.030)
Urobilinogen, Ur: 0.2 mg/dL (ref 0.2–1.0)
pH, UA: 5.5 (ref 5.0–7.5)

## 2022-10-14 MED ORDER — FLUCONAZOLE 150 MG PO TABS
150.0000 mg | ORAL_TABLET | ORAL | 0 refills | Status: DC | PRN
Start: 2022-10-14 — End: 2023-03-27

## 2022-10-14 MED ORDER — METRONIDAZOLE 500 MG PO TABS
500.0000 mg | ORAL_TABLET | Freq: Two times a day (BID) | ORAL | 0 refills | Status: DC
Start: 2022-10-14 — End: 2023-03-27

## 2022-10-14 NOTE — Progress Notes (Signed)
Acute Office Visit   Patient: Hannah Calhoun   DOB: 1961-03-25   62 y.o. Female  MRN: [id number] Visit Date: 10/14/2022  Today's healthcare provider: Oswaldo Conroy Bentzion Dauria, PA-C  Introduced myself to the patient as a Secondary school teacher and provided education on APPs in clinical practice.    Chief Complaint  Patient presents with   Pain    Pt states she has been having a constant pain in her L side for the past 3 weeks. States she feels the pain around into her back. States sometimes the pain eases off but is always there.    Subjective    HPI HPI     Pain    Additional comments: Pt states she has been having a constant pain in her L side for the past 3 weeks. States she feels the pain around into her back. States sometimes the pain eases off but is always there.       Last edited by Pablo Ledger, CMA on 10/14/2022 10:02 AM.       Left side pain   Onset: gradual  Duration: about 3 weeks  Location: left lower abdominal quadrant with pain in left flank area  Radiation: yes to left flank  Pain level and character: 2/10 today, sometimes it is more severe. Sharp in nature and achy/sore sometimes  Other associated symptoms: she denies dysuria, hematuria, fevers, chills, pain with bowel movements She reports a hx of hemorrhoids and has occasional bright red blood with bowel movements - most recent was last week with bright red blood  Interventions:  Tylenol, she reports some relief with this  Alleviating: Tylenol  Aggravating: nothing to her knowledge- denies worsening with PO intake, bowel movements, certain motions She reports persistent loose stools since her gallbladder was removed    She has a hx of diverticulosis on colonoscopy last year   Medications: Outpatient Medications Prior to Visit  Medication Sig   acetaminophen (TYLENOL) 500 MG tablet Take 650 mg by mouth 2 (two) times daily.   amLODipine (NORVASC) 10 MG tablet TAKE 1 TABLET BY MOUTH EVERY DAY    Cholecalciferol (VITAMIN D3) 50 MCG (2000 UT) TABS Take 1 tablet by mouth daily.   gabapentin (NEURONTIN) 300 MG capsule TAKE 1 CAPSULE BY MOUTH THREE TIMES A DAY   GARLIC PO Take by mouth daily.   glipiZIDE (GLUCOTROL) 5 MG tablet TAKE 1 TABLET BY MOUTH TWICE A DAY BEFORE A MEAL-NO MORE REFILLS WITHOUT OFFICE VISIT   hydrochlorothiazide (HYDRODIURIL) 25 MG tablet TAKE 1 TABLET (25 MG TOTAL) BY MOUTH DAILY.   metoprolol succinate (TOPROL-XL) 25 MG 24 hr tablet TAKE 1 TABLET (25 MG TOTAL) BY MOUTH DAILY.   Multiple Vitamin (MULTI-VITAMIN) tablet Take 1 tablet by mouth daily.   potassium chloride SA (KLOR-CON M20) 20 MEQ tablet TAKE 1 TABLET BY MOUTH TWICE A DAY   rosuvastatin (CRESTOR) 5 MG tablet Take 1 tablet (5 mg total) by mouth daily.   triamcinolone cream (KENALOG) 0.1 % APPLY TO AFFECTED AREA TWICE A DAY   valsartan (DIOVAN) 40 MG tablet TAKE 1 TABLET BY MOUTH EVERY DAY   No facility-administered medications prior to visit.    Review of Systems  Constitutional:  Negative for chills, fatigue and fever.  Respiratory:  Negative for shortness of breath and wheezing.   Gastrointestinal:  Positive for abdominal pain, anal bleeding and nausea (mild- has not interfered with daily activity). Negative for diarrhea and vomiting.  Genitourinary:  Positive for flank pain. Negative for difficulty urinating, dysuria, frequency, hematuria, vaginal bleeding, vaginal discharge and vaginal pain.       Objective    BP 129/68   Pulse 62   Temp 98.7 F (37.1 C) (Oral)   Wt 212 lb (96.2 kg)   LMP  (LMP Unknown)   SpO2 98%   BMI 34.48 kg/m    Physical Exam Vitals reviewed.  Constitutional:      General: She is awake.     Appearance: Normal appearance. She is well-developed and well-groomed.  HENT:     Head: Normocephalic and atraumatic.  Abdominal:     General: Abdomen is flat. Bowel sounds are normal.     Palpations: Abdomen is soft.     Tenderness: There is abdominal tenderness in the  left upper quadrant and left lower quadrant. There is left CVA tenderness. There is no right CVA tenderness, guarding or rebound. Negative signs include Murphy's sign and McBurney's sign.  Neurological:     General: No focal deficit present.     Mental Status: She is alert and oriented to person, place, and time.  Psychiatric:        Mood and Affect: Mood normal.        Behavior: Behavior normal. Behavior is cooperative.       No results found for any visits on 10/14/22.  Assessment & Plan      No follow-ups on file.      Problem List Items Addressed This Visit   None Visit Diagnoses     LLQ abdominal pain    -  Primary Acute, new concern Patient reports left lower abdominal pain which radiates into her left flank that has been ongoing for the past 3 weeks.  She reports moderate relief with Tylenol. She denies fever, chills, changes to bowel habits, blood with bowel movements, dysuria, hematuria, suprapubic pain. Her urinalysis was overall normal.  Her wet prep was positive for yeast and clue cells which may be causative source for her symptoms. Will send in Diflucan along with metronidazole for management. We did discuss other potential etiologies for her pain such as nephrolithiasis, diverticulitis, UTI.  UTI is unlikely given UA results.  I am also skeptical of nephrolithiasis at this point given lack of dysuria and hematuria.  If symptoms persist after treatment with Diflucan and metronidazole, CT scan may be in order for further rule out. Follow-up as needed for persistent or progressing symptoms after treatment is completed    Flank pain       Relevant Orders   Urinalysis, Routine w reflex microscopic   WET PREP FOR TRICH, YEAST, CLUE   BV (bacterial vaginosis)       Relevant Medications   metroNIDAZOLE (FLAGYL) 500 MG tablet   fluconazole (DIFLUCAN) 150 MG tablet   Vaginal yeast infection       Relevant Medications   metroNIDAZOLE (FLAGYL) 500 MG tablet   fluconazole  (DIFLUCAN) 150 MG tablet        No follow-ups on file.   I, [employer]on [employer] E Gaspard Isbell, PA-C, have reviewed all documentation for this visit. The documentation on 10/14/22 for the exam, diagnosis, procedures, and orders are all accurate and complete.   Jacquelin Hawking, MHS, PA-C [facility]

## 2022-10-14 NOTE — Progress Notes (Signed)
Your cervicovaginal swab was positive for yeast and bacterial vaginosis. I have sent in two medications to treat these conditions. Diflucan for the yeast infection and Metronidazole for the bacterial vaginosis. Please finish the entire course of medication unless you develop an allergic reaction. The Metronidazole can cause severe nausea and vomiting if alcohol is consumed during the course of treatment so please avoid this. Let us know if you have any questions or concerns and if your symptoms persist after your treatment is complete.

## 2022-10-14 NOTE — Progress Notes (Unsigned)
Care Management & Coordination Services Pharmacy Team  Reason for Encounter: General adherence update   Contacted patient for general health update and medication adherence call.  {US HC Outreach:28874}   What concerns do you have about your medications?  The patient {denies/reports:25180} side effects with their medications.   How often do you forget or accidentally miss a dose? {missed doses:25554}  Do you use a pillbox? {yes/no:20286}  Are you having any problems getting your medications from your pharmacy? {yes/no:20286}  Has the cost of your medications been a concern? {yes/no:20286} If yes, what medication and is patient assistance available or has it been applied for?  Since last visit with PharmD, {no/thefollowing:25210} interventions have been made.   The patient has not had an ED visit since last contact.   The patient {denies/reports:25180} problems with their health.   Patient {denies/reports:25180} concerns or questions for ***, PharmD at this time.   Counseled patient on: {GENERALCOUNSELING:28686}   Chart Updates:  Recent office visits:  10/04/22 Larae Grooms, NP (Annual exam) Orders placed: Labs. Medication changes: Rosuvastatin 5 mg  07/07/22 Larae Grooms, NP (HTN/Diabetes) Orders placed: Labs. Medication changes: none  Recent consult visits:  None since last coordination call  Hospital visits:  None in previous 6 months  Medications: Outpatient Encounter Medications as of 10/14/2022  Medication Sig   acetaminophen (TYLENOL) 500 MG tablet Take 650 mg by mouth 2 (two) times daily.   amLODipine (NORVASC) 10 MG tablet TAKE 1 TABLET BY MOUTH EVERY DAY   Cholecalciferol (VITAMIN D3) 50 MCG (2000 UT) TABS Take 1 tablet by mouth daily.   gabapentin (NEURONTIN) 300 MG capsule TAKE 1 CAPSULE BY MOUTH THREE TIMES A DAY   GARLIC PO Take by mouth daily.   glipiZIDE (GLUCOTROL) 5 MG tablet TAKE 1 TABLET BY MOUTH TWICE A DAY BEFORE A MEAL-NO MORE REFILLS  WITHOUT OFFICE VISIT   hydrochlorothiazide (HYDRODIURIL) 25 MG tablet TAKE 1 TABLET (25 MG TOTAL) BY MOUTH DAILY.   metoprolol succinate (TOPROL-XL) 25 MG 24 hr tablet TAKE 1 TABLET (25 MG TOTAL) BY MOUTH DAILY.   Multiple Vitamin (MULTI-VITAMIN) tablet Take 1 tablet by mouth daily.   potassium chloride SA (KLOR-CON M20) 20 MEQ tablet TAKE 1 TABLET BY MOUTH TWICE A DAY   rosuvastatin (CRESTOR) 5 MG tablet Take 1 tablet (5 mg total) by mouth daily.   triamcinolone cream (KENALOG) 0.1 % APPLY TO AFFECTED AREA TWICE A DAY   valsartan (DIOVAN) 40 MG tablet TAKE 1 TABLET BY MOUTH EVERY DAY   No facility-administered encounter medications on file as of 10/14/2022.    Recent vitals BP Readings from Last 3 Encounters:  10/04/22 133/87  07/04/22 119/72  04/05/22 111/61   Pulse Readings from Last 3 Encounters:  10/04/22 (!) 54  07/04/22 70  04/05/22 60   Wt Readings from Last 3 Encounters:  10/04/22 211 lb (95.7 kg)  07/04/22 206 lb 8 oz (93.7 kg)  06/27/22 197 lb (89.4 kg)   BMI Readings from Last 3 Encounters:  10/04/22 34.32 kg/m  07/04/22 33.33 kg/m  06/27/22 31.80 kg/m    Recent lab results    Component Value Date/Time   NA 142 10/04/2022 0932   NA 134 (L) 01/22/2013 2140   K 3.8 10/04/2022 0932   K 3.1 (L) 01/22/2013 2140   CL 103 10/04/2022 0932   CL 104 01/22/2013 2140   CO2 24 10/04/2022 0932   CO2 25 01/22/2013 2140   GLUCOSE 114 (H) 10/04/2022 0932   GLUCOSE 93 12/10/2021  1550   GLUCOSE 102 (H) 01/22/2013 2140   BUN 13 10/04/2022 0932   BUN 10 01/22/2013 2140   CREATININE 0.90 10/04/2022 0932   CREATININE 0.86 01/22/2013 2140   CALCIUM 9.9 10/04/2022 0932   CALCIUM 8.2 (L) 01/22/2013 2140    Lab Results  Component Value Date   CREATININE 0.90 10/04/2022   GFR 72.35 04/28/2015   EGFR 74 10/04/2022   GFRNONAA >60 12/10/2021   GFRAA 81 07/03/2020   Lab Results  Component Value Date/Time   HGBA1C 6.7 (H) 10/04/2022 09:32 AM   HGBA1C 6.5 (H) 07/04/2022  08:44 AM   HGBA1C 7.5 (H) 07/03/2020 09:54 AM   MICROALBUR 30 (H) 10/04/2022 09:34 AM   MICROALBUR 80 (H) 07/05/2021 10:24 AM    Lab Results  Component Value Date   CHOL 225 (H) 10/04/2022   HDL 60 10/04/2022   LDLCALC 141 (H) 10/04/2022   TRIG 135 10/04/2022   CHOLHDL 3.8 10/04/2022    Care Gaps: Annual wellness visit in last year? Yes, 10/04/22  If Diabetic: Last eye exam / retinopathy screening:NA Last diabetic foot exam:10/04/22 Last UACR: 10/04/22  Star Rating Drugs:  Medication:  Last Fill: Day Supply Glipizide 5 mg Rosuvastatin 5 mg Valsartan 40 mg  Velvet Bathe

## 2022-12-01 DIAGNOSIS — E113393 Type 2 diabetes mellitus with moderate nonproliferative diabetic retinopathy without macular edema, bilateral: Secondary | ICD-10-CM | POA: Diagnosis not present

## 2022-12-01 LAB — HM DIABETES EYE EXAM

## 2022-12-19 ENCOUNTER — Encounter: Payer: Self-pay | Admitting: Nurse Practitioner

## 2022-12-20 ENCOUNTER — Ambulatory Visit: Payer: Self-pay | Admitting: *Deleted

## 2022-12-20 NOTE — Telephone Encounter (Signed)
  Chief Complaint: right elbow pain worsening possible statin SE Symptoms: right elbow pain severe at times. Sharp pain difficulty using right arm due to pain. Pt feels possible due to statin rosuvastatin Frequency: approx. 2 months but getting worse Pertinent Negatives: Patient denies no chest pain no difficulty breathing no c/o weakness N/T right arm. No c/o right leg pain or issues  Disposition: [] ED /[] Urgent Care (no appt availability in office) / [x] Appointment(In office/virtual)/ []  [facility]/ [] Home Care/ [] Refused Recommended Disposition /[] [facility]/ []  Follow-up with PCP Additional Notes:   Scheduled appt 12/21/22 . Recommended patient stop taking rosuvastatin until she reviews medication with provider at appt tomorrow. Please advise regarding medication. Recommended if pain in right elbow worsens go to UC.     Reason for Disposition  [1] Caller has URGENT medicine question about med that PCP or specialist prescribed AND [2] triager unable to answer question  Answer Assessment - Initial Assessment Questions 1. NAME of MEDICINE: "What medicine(s) are you calling about?"     rosuvastatin 2. QUESTION: "What is your question?" (e.g., double dose of medicine, side effect)     Can medication be causing sharp pain right elbow ? 3. PRESCRIBER: "Who prescribed the medicine?" Reason: if prescribed by specialist, call should be referred to that group.     PCP 4. SYMPTOMS: "Do you have any symptoms?" If Yes, ask: "What symptoms are you having?"  "How bad are the symptoms (e.g., mild, moderate, severe)     Yes pain in right elbow thinks from statin  5. PREGNANCY:  "Is there any chance that you are pregnant?" "When was your last menstrual period?"     na  Protocols used: Medication Question Call-A-AH

## 2022-12-21 ENCOUNTER — Ambulatory Visit: Payer: PPO | Admitting: Physician Assistant

## 2022-12-22 ENCOUNTER — Ambulatory Visit (INDEPENDENT_AMBULATORY_CARE_PROVIDER_SITE_OTHER): Payer: PPO | Admitting: Physician Assistant

## 2022-12-22 ENCOUNTER — Encounter: Payer: Self-pay | Admitting: Physician Assistant

## 2022-12-22 ENCOUNTER — Other Ambulatory Visit: Payer: Self-pay | Admitting: Physician Assistant

## 2022-12-22 VITALS — BP 138/75 | HR 63 | Ht 65.0 in | Wt 214.0 lb

## 2022-12-22 DIAGNOSIS — Z7984 Long term (current) use of oral hypoglycemic drugs: Secondary | ICD-10-CM | POA: Diagnosis not present

## 2022-12-22 DIAGNOSIS — M791 Myalgia, unspecified site: Secondary | ICD-10-CM

## 2022-12-22 DIAGNOSIS — T466X5A Adverse effect of antihyperlipidemic and antiarteriosclerotic drugs, initial encounter: Secondary | ICD-10-CM

## 2022-12-22 DIAGNOSIS — E785 Hyperlipidemia, unspecified: Secondary | ICD-10-CM

## 2022-12-22 DIAGNOSIS — E1169 Type 2 diabetes mellitus with other specified complication: Secondary | ICD-10-CM | POA: Diagnosis not present

## 2022-12-22 MED ORDER — FENOFIBRATE 40 MG PO TABS
40.0000 mg | ORAL_TABLET | Freq: Two times a day (BID) | ORAL | 0 refills | Status: DC
Start: 2022-12-22 — End: 2022-12-26

## 2022-12-22 NOTE — Patient Instructions (Addendum)
Please try to reduce your consumption of saturate fats in your diet Please try to incorporate at least 150 minutes of moderate intensity physical activity per week to help with managing your cholesterol You can stop the Crestor and start the Fenofibrate to help control your cholesterol  You can start with one Fenofibrate once per day for the first 1-2 weeks then increase to twice per day from there.  I also recommend starting a fish oil supplement to help with management

## 2022-12-22 NOTE — Progress Notes (Signed)
Acute Office Visit   Patient: Hannah Calhoun   DOB: 01/10/1961   62 y.o. Female  MRN: [id number] Visit Date: 12/22/2022  Today's healthcare provider: Oswaldo Conroy Firmin Belisle, PA-C  Introduced myself to the patient as a Secondary school teacher and provided education on APPs in clinical practice.    Chief Complaint  Patient presents with   Elbow Pain   Medication Reaction    Patient says she restarted her Cholesterol back in May, and noticed a month after restarting the medication. Patient says she noticed some pain all over her body. Patient says she has tried Tylenol to help with the pain and discomfort. Patient says the pain is constant. Patient has taken Crestor in the past and had similar issues and she stopped the medication and noticed her pain got better within two months.    Subjective    HPI HPI     Medication Reaction    Additional comments: Patient says she restarted her Cholesterol back in May, and noticed a month after restarting the medication. Patient says she noticed some pain all over her body. Patient says she has tried Tylenol to help with the pain and discomfort. Patient says the pain is constant. Patient has taken Crestor in the past and had similar issues and she stopped the medication and noticed her pain got better within two months.       Last edited by Malen Gauze, CMA on 12/22/2022  2:28 PM.      Concern for statin-induced myalgias  Onset: gradual  Duration: about a month  Location: Right elbow, right shoulder along trapezius are most noticeable  She reports she has arthritis  She takes her Crestor at bedtime but has stopped per recommendations. Last dose was Tuesday - she has not noticed improvement in pain  Associated symptoms: she denies cramping. She reports mild numbness in her left hand/ left thumb Interventions: Tylenol   She has had similar reaction to Crestor in the past but was found to have increased cholesterol at previous visit        Medications: Outpatient Medications Prior to Visit  Medication Sig   acetaminophen (TYLENOL) 500 MG tablet Take 650 mg by mouth 2 (two) times daily.   amLODipine (NORVASC) 10 MG tablet TAKE 1 TABLET BY MOUTH EVERY DAY   Cholecalciferol (VITAMIN D3) 50 MCG (2000 UT) TABS Take 1 tablet by mouth daily.   fluconazole (DIFLUCAN) 150 MG tablet Take 1 tablet (150 mg total) by mouth every three (3) days as needed. May repeat in 3 days if symptoms not resolved   gabapentin (NEURONTIN) 300 MG capsule TAKE 1 CAPSULE BY MOUTH THREE TIMES A DAY   GARLIC PO Take by mouth daily.   glipiZIDE (GLUCOTROL) 5 MG tablet TAKE 1 TABLET BY MOUTH TWICE A DAY BEFORE A MEAL-NO MORE REFILLS WITHOUT OFFICE VISIT   hydrochlorothiazide (HYDRODIURIL) 25 MG tablet TAKE 1 TABLET (25 MG TOTAL) BY MOUTH DAILY.   metoprolol succinate (TOPROL-XL) 25 MG 24 hr tablet TAKE 1 TABLET (25 MG TOTAL) BY MOUTH DAILY.   Multiple Vitamin (MULTI-VITAMIN) tablet Take 1 tablet by mouth daily.   potassium chloride SA (KLOR-CON M20) 20 MEQ tablet TAKE 1 TABLET BY MOUTH TWICE A DAY   rosuvastatin (CRESTOR) 5 MG tablet Take 1 tablet (5 mg total) by mouth daily.   SHINGRIX injection    triamcinolone cream (KENALOG) 0.1 % APPLY TO AFFECTED AREA TWICE A DAY   valsartan (DIOVAN) 40 MG tablet  TAKE 1 TABLET BY MOUTH EVERY DAY   metroNIDAZOLE (FLAGYL) 500 MG tablet Take 1 tablet (500 mg total) by mouth 2 (two) times daily. (Patient not taking: Reported on 12/22/2022)   No facility-administered medications prior to visit.    Review of Systems  Musculoskeletal:  Positive for myalgias.  Neurological:  Positive for numbness (right hand). Negative for tremors and weakness.        Objective    BP 138/75   Pulse 63   Ht 5\' 5"  (1.651 m)   Wt 214 lb (97.1 kg)   LMP  (LMP Unknown)   SpO2 99%   BMI 35.61 kg/m     Physical Exam Vitals reviewed.  Constitutional:      General: She is awake.     Appearance: Normal appearance. She is  well-developed and well-groomed.  HENT:     Head: Normocephalic and atraumatic.  Pulmonary:     Effort: Pulmonary effort is normal.  Skin:    Coloration: Skin is not ashen or jaundiced.     Findings: No rash.  Neurological:     Mental Status: She is alert.  Psychiatric:        Attention and Perception: Attention and perception normal.        Mood and Affect: Mood and affect normal.        Speech: Speech normal.        Behavior: Behavior normal. Behavior is cooperative.       No results found for any visits on 12/22/22.  Assessment & Plan      Return in about 3 months (around 03/24/2023) for HLD.      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       Relevant Medications   Fenofibrate 40 MG TABS     Return in about 3 months (around 03/24/2023) for HLD.   I, Zackery Brine E Tura Roller, PA-C, have reviewed all documentation for this visit. The documentation on 12/23/22 for the exam, diagnosis, procedures, and orders are all accurate and complete.   Jacquelin Hawking, MHS, PA-C [facility]

## 2022-12-23 ENCOUNTER — Other Ambulatory Visit: Payer: Self-pay | Admitting: Physician Assistant

## 2022-12-23 DIAGNOSIS — E1169 Type 2 diabetes mellitus with other specified complication: Secondary | ICD-10-CM

## 2022-12-23 DIAGNOSIS — T466X5A Adverse effect of antihyperlipidemic and antiarteriosclerotic drugs, initial encounter: Secondary | ICD-10-CM

## 2022-12-23 NOTE — Assessment & Plan Note (Signed)
Acute, recurrent She reports previously developing muscle aches with statin and new ones have started in her elbow and shoulder Will start trial of Fenofibrate and fish oil for cholesterol management If myalgia not improving or cholesterol starts to worsen, may need to explore repatha or zetia instead Recheck cholesterol in about 3 months to assess response to changes.

## 2022-12-23 NOTE — Assessment & Plan Note (Signed)
Chronic, ongoing Will stop statin due to recurrent myalgias and attempt control with Fenofibrate and fish oil for now Recheck lipid panel in about 3 months to assess changes

## 2022-12-23 NOTE — Telephone Encounter (Signed)
Requested medication (s) are due for refill today: alternative medication requested  Requested medication (s) are on the active medication list: yes  Last refill:  12/22/22  Future visit scheduled: yes  Notes to clinic:  Pharmacy comment: Alternative Requested:DRUG NOT ON FORMULARY.      Requested Prescriptions  Pending Prescriptions Disp Refills   Fenofibrate 40 MG TABS [Pharmacy Med Name: FENOFIBRATE 40 MG TABLET] 180 tablet 0    Sig: Take 1 tablet (40 mg total) by mouth in the morning and at bedtime.     Cardiovascular:  Antilipid - Fibric Acid Derivatives Failed - 12/22/2022  3:18 PM      Failed - HGB in normal range and within 360 days    Hemoglobin  Date Value Ref Range Status  12/10/2021 13.7 12.0 - 15.0 g/dL Final  16/02/9603 54.0 11.1 - 15.9 g/dL Final         Failed - HCT in normal range and within 360 days    HCT  Date Value Ref Range Status  12/10/2021 40.8 36.0 - 46.0 % Final   Hematocrit  Date Value Ref Range Status  07/05/2021 42.0 34.0 - 46.6 % Final         Failed - PLT in normal range and within 360 days    Platelets  Date Value Ref Range Status  12/10/2021 420 (H) 150 - 400 K/uL Final  07/05/2021 421 150 - 450 x10E3/uL Final         Failed - WBC in normal range and within 360 days    WBC  Date Value Ref Range Status  12/10/2021 10.8 (H) 4.0 - 10.5 K/uL Final         Failed - Lipid Panel in normal range within the last 12 months    Cholesterol, Total  Date Value Ref Range Status  10/04/2022 225 (H) 100 - 199 mg/dL Final   LDL Chol Calc (NIH)  Date Value Ref Range Status  10/04/2022 141 (H) 0 - 99 mg/dL Final   HDL  Date Value Ref Range Status  10/04/2022 60 >39 mg/dL Final   Triglycerides  Date Value Ref Range Status  10/04/2022 135 0 - 149 mg/dL Final         Passed - ALT in normal range and within 360 days    ALT  Date Value Ref Range Status  10/04/2022 21 0 - 32 IU/L Final   SGPT (ALT)  Date Value Ref Range Status   01/22/2013 28 12 - 78 U/L Final         Passed - AST in normal range and within 360 days    AST  Date Value Ref Range Status  10/04/2022 20 0 - 40 IU/L Final   SGOT(AST)  Date Value Ref Range Status  01/22/2013 33 15 - 37 Unit/L Final         Passed - Cr in normal range and within 360 days    Creatinine  Date Value Ref Range Status  01/22/2013 0.86 0.60 - 1.30 mg/dL Final   Creatinine, Ser  Date Value Ref Range Status  10/04/2022 0.90 0.57 - 1.00 mg/dL Final   Creatinine,U  Date Value Ref Range Status  09/10/2014 65.8 mg/dL Final         Passed - eGFR is 30 or above and within 360 days    EGFR (African American)  Date Value Ref Range Status  01/22/2013 >60  Final   GFR calc Af [facility]  Date Value Ref Range Status  07/03/2020 81 >59 mL/min/1.73 Final    Comment:    **In accordance with recommendations from the NKF-ASN Task force,**   [facility] is in the process of updating its eGFR calculation to the   2021 CKD-EPI creatinine equation that estimates kidney function   without a race variable.    EGFR (Non-African Amer.)  Date Value Ref Range Status  01/22/2013 >60  Final    Comment:    eGFR values <83mL/min/1.73 m2 may be an indication of chronic kidney disease (CKD). Calculated eGFR is useful in patients with stable renal function. The eGFR calculation will not be reliable in acutely ill patients when serum creatinine is changing rapidly. It is not useful in  patients on dialysis. The eGFR calculation may not be applicable to patients at the low and high extremes of body sizes, pregnant women, and vegetarians.    GFR, Estimated  Date Value Ref Range Status  12/10/2021 >60 >60 mL/min Final    Comment:    (NOTE) Calculated using the CKD-EPI Creatinine Equation (2021)    GFR  Date Value Ref Range Status  04/28/2015 72.35 >60.00 mL/min Final   eGFR  Date Value Ref Range Status  10/04/2022 74 >59 mL/min/1.73 Final         Passed - Valid encounter within  last 12 months    Recent Outpatient Visits           Yesterday Myalgia due to statin   [facility], Oswaldo Conroy, PA-C   2 months ago LLQ abdominal pain   [facility], Oswaldo Conroy, PA-C   2 months ago Annual physical exam   [facility], [facility]   5 months ago Hypertension associated with diabetes [facility])   [place] [facility] Larae Grooms, NP   8 months ago Hypertension associated with diabetes [facility])   [facility] Lar[facility] G[facility], NP       Future Appointments             In 3 months Larae Grooms, NP [facility]   In 3 months Larae Grooms, NP [facility], [facility]

## 2022-12-26 NOTE — Telephone Encounter (Signed)
Pharmacy comment: Alternative Requested:PRIOR AUTH REQUIRED

## 2022-12-30 ENCOUNTER — Ambulatory Visit: Payer: PPO | Admitting: Physician Assistant

## 2023-01-11 ENCOUNTER — Other Ambulatory Visit: Payer: Self-pay | Admitting: Nurse Practitioner

## 2023-01-11 DIAGNOSIS — Z1231 Encounter for screening mammogram for malignant neoplasm of breast: Secondary | ICD-10-CM

## 2023-01-12 ENCOUNTER — Ambulatory Visit: Payer: PPO | Admitting: Physician Assistant

## 2023-01-24 ENCOUNTER — Ambulatory Visit
Admission: RE | Admit: 2023-01-24 | Discharge: 2023-01-24 | Disposition: A | Discharge status: Home / Self Care | Payer: PPO | Source: Ambulatory Visit | Attending: Nurse Practitioner | Admitting: Nurse Practitioner

## 2023-01-24 DIAGNOSIS — Z1231 Encounter for screening mammogram for malignant neoplasm of breast: Secondary | ICD-10-CM | POA: Diagnosis not present

## 2023-02-04 ENCOUNTER — Other Ambulatory Visit: Payer: Self-pay | Admitting: Nurse Practitioner

## 2023-02-06 NOTE — Telephone Encounter (Signed)
Requested medication (s) are due for refill today - yes  Requested medication (s) are on the active medication list -yes  Future visit scheduled -yes  Last refill: 09/28/22 30g   Notes to clinic: non delegated Rx  Requested Prescriptions  Pending Prescriptions Disp Refills   triamcinolone cream (KENALOG) 0.1 % [Pharmacy Med Name: TRIAMCINOLONE 0.1% CREAM] 30 g 0    Sig: APPLY TO AFFECTED AREA TWICE A DAY     Not Delegated - Dermatology:  Corticosteroids Failed - 02/04/2023  6:48 PM      Failed - This refill cannot be delegated      Passed - Valid encounter within last 12 months    Recent Outpatient Visits           1 month ago Myalgia due to statin   [place] [facility], Oswaldo Conroy, PA-C   3 months ago LLQ abdominal pain   [place] [facility] Mecum, Oswaldo Conroy, PA-C   4 months ago Annual physical exam   [place] [facility], NP   7 months ago Hypertension associated with diabetes [facility])   [place] [facility] Lar[facility] G[facility], NP   10 months ago Hypertension associated with diabetes [facility])   [place] [facility] Larae G[facility], NP       Future Appointments             In 1 month Larae Grooms, NP [place] [facility], [facility]   In 2 months Larae Grooms, NP [place] [facility], [facility]               Requested Prescriptions  Pending Prescriptions Disp Refills   triamcinolone cream (KENALOG) 0.1 % [Pharmacy Med Name: TRIAMCINOLONE 0.1% CREAM] 30 g 0    Sig: APPLY TO AFFECTED AREA TWICE A DAY     Not Delegated - Dermatology:  Corticosteroids Failed - 02/04/2023  6:48 PM      Failed - This refill cannot be delegated      Passed - Valid encounter within last 12 months    Recent Outpatient Visits           1 month ago Myalgia due to statin   [place] [facility] Mecum, Oswaldo Conroy, PA-C   3 months ago LLQ abdominal pain    [place] [facility] Mecum, Oswaldo Conroy, PA-C   4 months ago Annual physical exam   [place] [facility] Larae Grooms, NP   7 months ago Hypertension associated with diabetes [facility])   [place] [facility] Lar[facility] G[facility], NP   10 months ago Hypertension associated with diabetes [facility])   [place] [facility], NP       Future Appointments             In 1 month Larae Grooms, NP [place] [facility], [facility]   In 2 months Larae Grooms, NP [place] [facility], [facility]

## 2023-02-09 ENCOUNTER — Other Ambulatory Visit: Payer: Self-pay | Admitting: Nurse Practitioner

## 2023-02-09 NOTE — Telephone Encounter (Signed)
Requested Prescriptions  Pending Prescriptions Disp Refills   glipiZIDE (GLUCOTROL) 5 MG tablet [Pharmacy Med Name: GLIPIZIDE 5 MG TABLET] 60 tablet 0    Sig: TAKE 1 TABLET BY MOUTH TWICE A DAY BEFORE A MEAL-NO MORE REFILLS WITHOUT OFFICE VISIT     Endocrinology:  Diabetes - Sulfonylureas Passed - 02/09/2023  2:37 AM      Passed - HBA1C is between 0 and 7.9 and within 180 days    HB A1C (BAYER DCA - WAIVED)  Date Value Ref Range Status  07/03/2020 7.5 (H) <7.0 % Final    Comment:                                          Diabetic Adult            <7.0                                       Healthy Adult        4.3 - 5.7                                                           (DCCT/NGSP) American Diabetes Association's Summary of Glycemic Recommendations for Adults with Diabetes: Hemoglobin A1c <7.0%. More stringent glycemic goals (A1c <6.0%) may further reduce complications at the cost of increased risk of hypoglycemia.    Hgb A1c MFr Bld  Date Value Ref Range Status  10/04/2022 6.7 (H) 4.8 - 5.6 % Final    Comment:             Prediabetes: 5.7 - 6.4          Diabetes: >6.4          Glycemic control for adults with diabetes: <7.0          Passed - Cr in normal range and within 360 days    Creatinine  Date Value Ref Range Status  01/22/2013 0.86 0.60 - 1.30 mg/dL Final   Creatinine, Ser  Date Value Ref Range Status  10/04/2022 0.90 0.57 - 1.00 mg/dL Final   Creatinine,U  Date Value Ref Range Status  09/10/2014 65.8 mg/dL Final         Passed - Valid encounter within last 6 months    Recent Outpatient Visits           1 month ago Myalgia due to statin   [place] [facility] Mecum, Oswaldo Conroy, PA-C   3 months ago LLQ abdominal pain   [place] [facility] Mecum, Oswaldo Conroy, PA-C   4 months ago Annual physical exam   [place] [facility] Larae Grooms, NP   7 months ago Hypertension associated with diabetes [facility])   [facility], NP   10 months ago Hypertension associated with diabetes [facility])   [place] [facility] Larae Grooms, NP       Future Appointments             In 1 month Larae Grooms, NP [place] [facility], [facility]   In 2  months Larae Grooms, NP [place] [facility], [facility]

## 2023-02-12 ENCOUNTER — Encounter: Payer: Self-pay | Admitting: Pharmacist

## 2023-02-12 NOTE — Progress Notes (Signed)
Pharmacy Quality Measure Review  This patient is appearing on a report for being at risk of failing the adherence measure for cholesterol (statin) medications this calendar year.   Medication: rosuvastatin 5 mg Last fill date: 5/29 for 90 day supply  Per last PCP note on 8/15, rosuvastatin was discontinued due to myalgias. No action needed at this time.   Jarrett Ables, PharmD PGY-1 Pharmacy Resident

## 2023-02-25 IMAGING — MG MM DIGITAL SCREENING BILAT W/ TOMO AND CAD
8 series · 8 of 24 positions shown · non-contrast
Comparison: Previous exam(s).

CLINICAL DATA: Screening.

EXAM:
DIGITAL SCREENING BILATERAL MAMMOGRAM WITH TOMOSYNTHESIS AND CAD
TECHNIQUE: Bilateral screening digital craniocaudal and mediolateral oblique
mammograms were obtained. Bilateral screening digital breast
tomosynthesis was performed. The images were evaluated with
computer-aided detection.

[R MLO synth-2D]
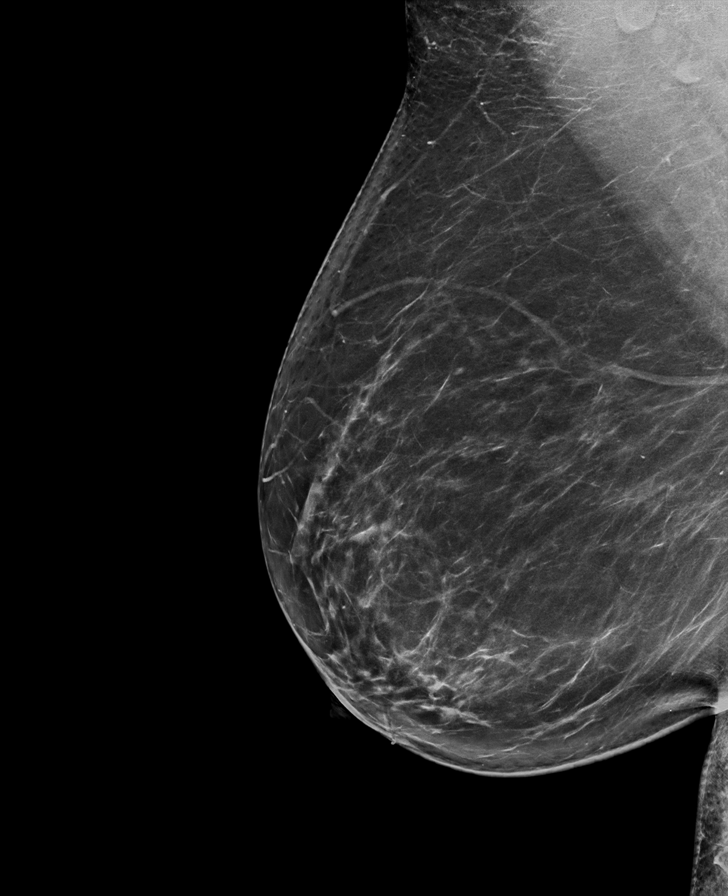

[R CC synth-2D]
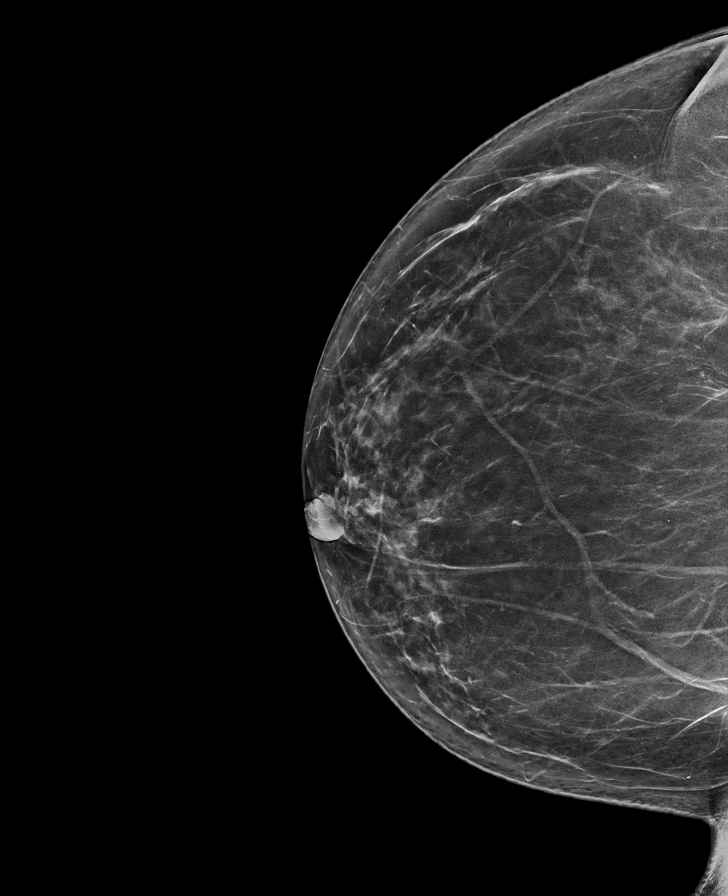

[L MLO synth-2D]
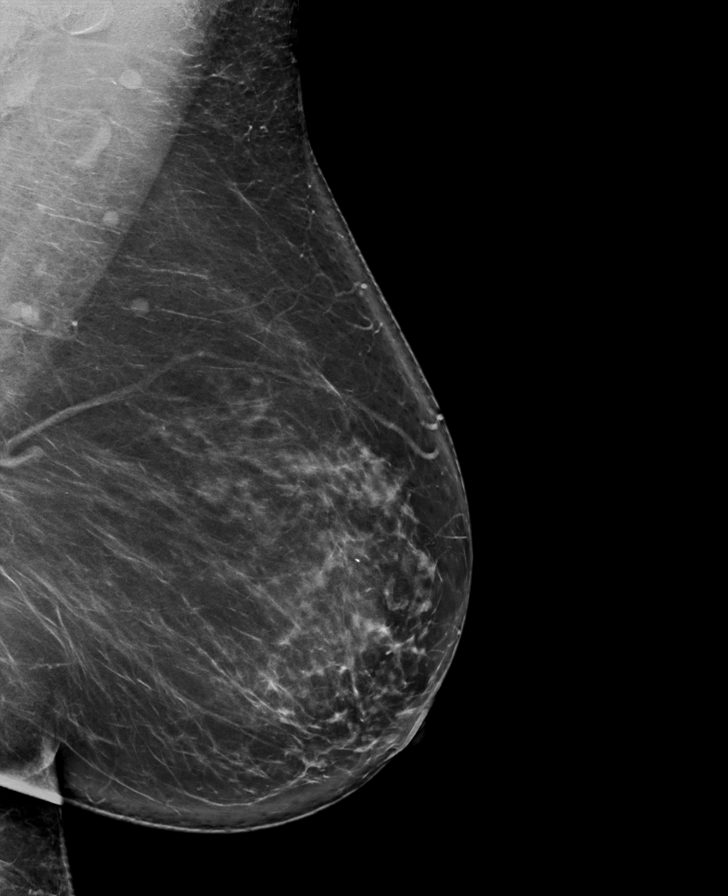

[L CC synth-2D]
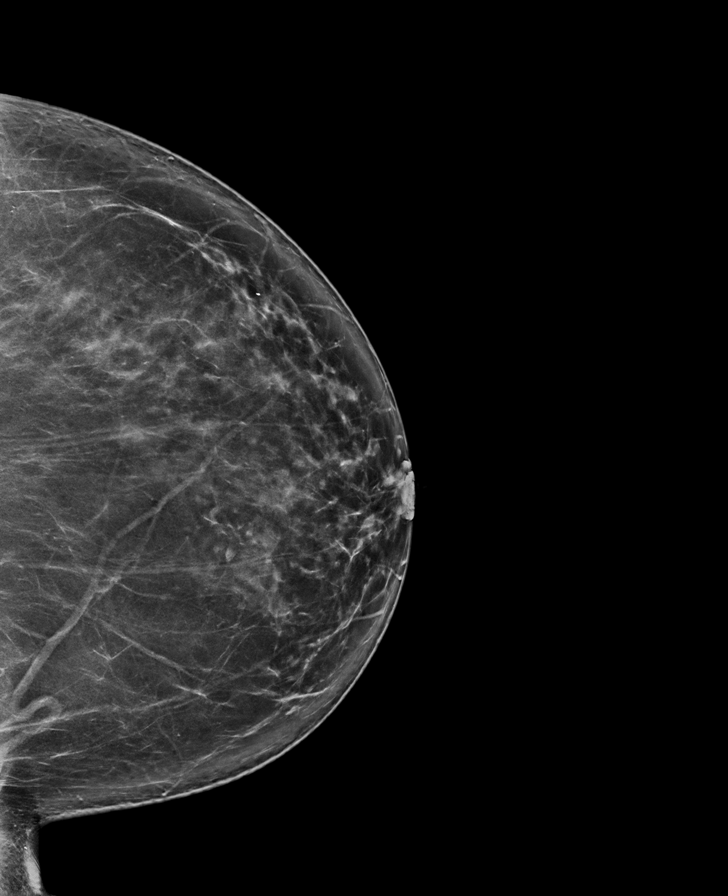

[L CC tomo · tomo slice 43/85.0]
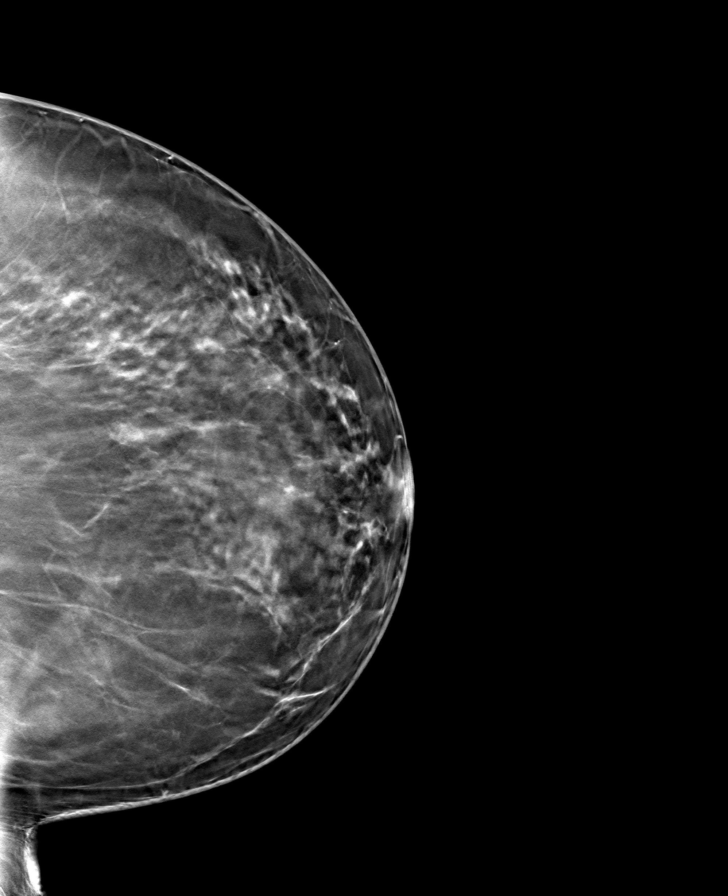

[L MLO tomo · tomo slice 48/95.0]
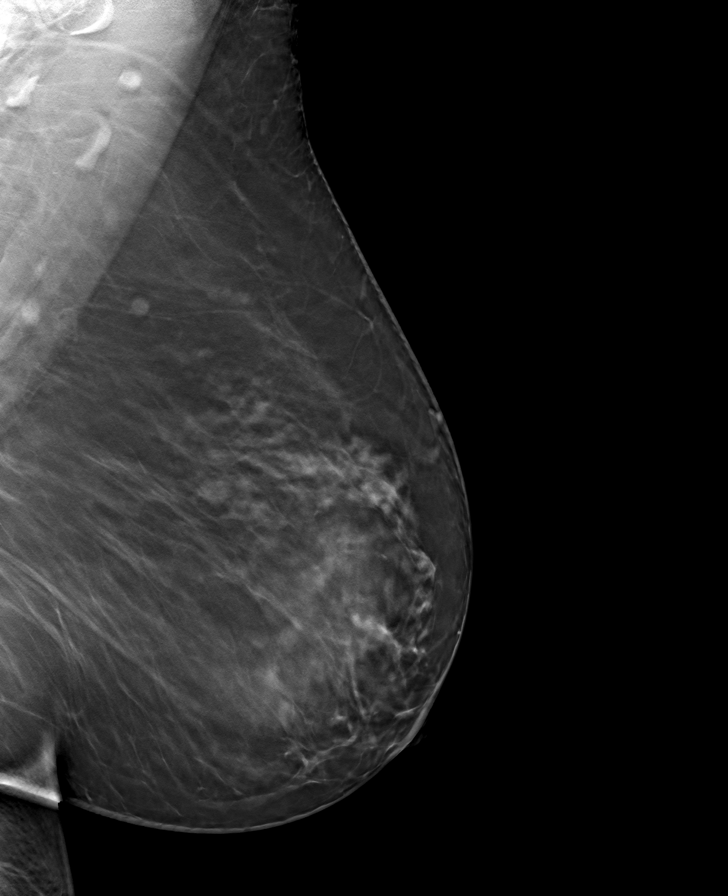

[R MLO tomo · tomo slice 45/89.0]
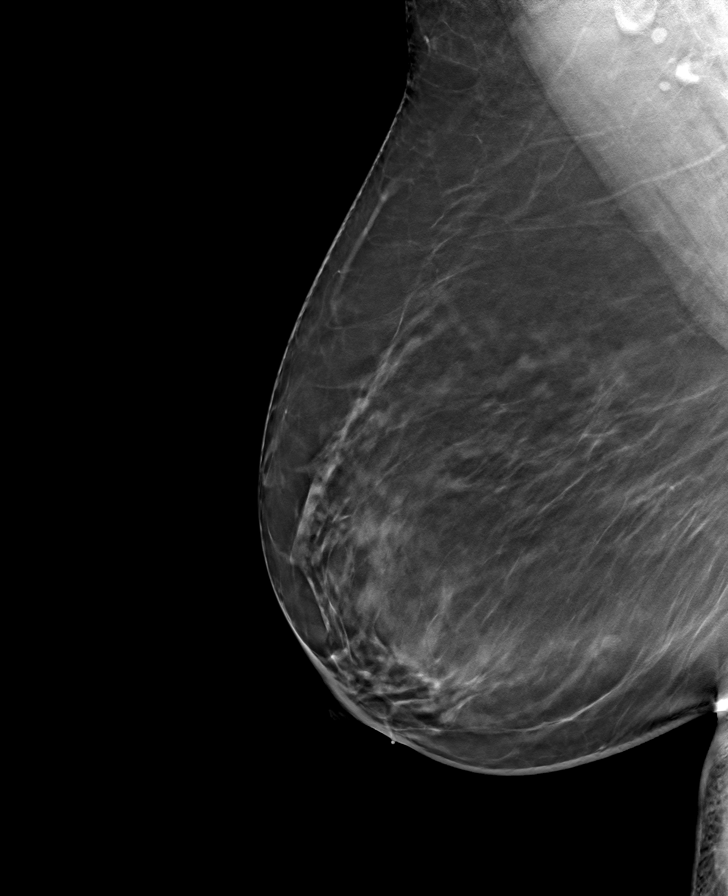

[R CC tomo · tomo slice 40/79.0]
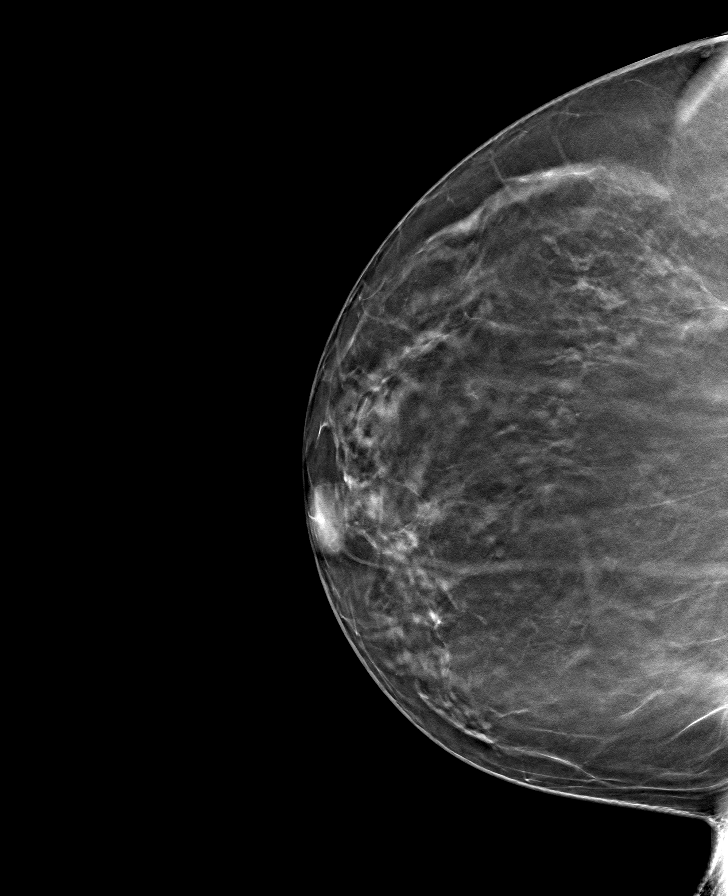

[8 of 24 positions shown; findings below may reference images not displayed]

ACR Breast Density Category b: There are scattered areas of
fibroglandular density.
FINDINGS: There are no findings suspicious for malignancy.
IMPRESSION: No mammographic evidence of malignancy. A result letter of this
screening mammogram will be mailed directly to the patient.

RECOMMENDATION:
Screening mammogram in one year. (Code:51-O-LD2)

BI-RADS CATEGORY  1: Negative.

## 2023-03-10 ENCOUNTER — Other Ambulatory Visit: Payer: Self-pay | Admitting: Nurse Practitioner

## 2023-03-10 NOTE — Telephone Encounter (Signed)
Requested medication (s) are due for refill today: Yes  Requested medication (s) are on the active medication list: Yes  Last refill:  02/09/23 #60, 0RF  Future visit scheduled: Yes  Notes to clinic:  Unable to refill per protocol, courtesy refill already given, routing for provider approval.      Requested Prescriptions  Pending Prescriptions Disp Refills   glipiZIDE (GLUCOTROL) 5 MG tablet [Pharmacy Med Name: GLIPIZIDE 5 MG TABLET] 180 tablet 1    Sig: TAKE 1 TABLET BY MOUTH TWICE A DAY BEFORE A MEAL-NO MORE REFILLS WITHOUT OFFICE VISIT     Endocrinology:  Diabetes - Sulfonylureas Passed - 03/10/2023 11:30 AM      Passed - HBA1C is between 0 and 7.9 and within 180 days    HB A1C (BAYER DCA - WAIVED)  Date Value Ref Range Status  07/03/2020 7.5 (H) <7.0 % Final    Comment:                                          Diabetic Adult            <7.0                                       Healthy Adult        4.3 - 5.7                                                           (DCCT/NGSP) American Diabetes Association's Summary of Glycemic Recommendations for Adults with Diabetes: Hemoglobin A1c <7.0%. More stringent glycemic goals (A1c <6.0%) may further reduce complications at the cost of increased risk of hypoglycemia.    Hgb A1c MFr Bld  Date Value Ref Range Status  10/04/2022 6.7 (H) 4.8 - 5.6 % Final    Comment:             Prediabetes: 5.7 - 6.4          Diabetes: >6.4          Glycemic control for adults with diabetes: <7.0          Passed - Cr in normal range and within 360 days    Creatinine  Date Value Ref Range Status  01/22/2013 0.86 0.60 - 1.30 mg/dL Final   Creatinine, Ser  Date Value Ref Range Status  10/04/2022 0.90 0.57 - 1.00 mg/dL Final   Creatinine,U  Date Value Ref Range Status  09/10/2014 65.8 mg/dL Final         Passed - Valid encounter within last 6 months    Recent Outpatient Visits           2 months ago Myalgia due to statin   [facility] Oswaldo Conroy, PA-C   4 months ago LLQ abdominal pain   [place] [facility] Mecum, Oswaldo Conroy, PA-C   5 months ago Annual physical exam   [place] [facility] Lar[facility], NP   8 months ago Hypertension associated with diabetes [facility])   [place] [facility] Larae Grooms, NP  11 months ago Hypertension associated with diabetes [facility])   [place] [facility] Lar[facility] Grooms, NP       Future Appointments             In 2 weeks Larae Grooms, NP [place] [facility], [facility]   In 1 month Larae Grooms, NP [facility], [facility]

## 2023-03-24 ENCOUNTER — Other Ambulatory Visit: Payer: Self-pay | Admitting: Physician Assistant

## 2023-03-24 DIAGNOSIS — E1169 Type 2 diabetes mellitus with other specified complication: Secondary | ICD-10-CM

## 2023-03-24 DIAGNOSIS — M791 Myalgia, unspecified site: Secondary | ICD-10-CM

## 2023-03-24 NOTE — Telephone Encounter (Signed)
Requested Prescriptions  Pending Prescriptions Disp Refills   fenofibrate 54 MG tablet [Pharmacy Med Name: FENOFIBRATE 54 MG TABLET] 90 tablet 0    Sig: TAKE 1 TABLET BY MOUTH DAILY     Cardiovascular:  Antilipid - Fibric Acid Derivatives Failed - 03/24/2023  2:40 AM      Failed - HGB in normal range and within 360 days    Hemoglobin  Date Value Ref Range Status  12/10/2021 13.7 12.0 - 15.0 g/dL Final  82/95/6213 08.6 11.1 - 15.9 g/dL Final         Failed - HCT in normal range and within 360 days    HCT  Date Value Ref Range Status  12/10/2021 40.8 36.0 - 46.0 % Final   Hematocrit  Date Value Ref Range Status  07/05/2021 42.0 34.0 - 46.6 % Final         Failed - PLT in normal range and within 360 days    Platelets  Date Value Ref Range Status  12/10/2021 420 (H) 150 - 400 K/uL Final  07/05/2021 421 150 - 450 x10E3/uL Final         Failed - WBC in normal range and within 360 days    WBC  Date Value Ref Range Status  12/10/2021 10.8 (H) 4.0 - 10.5 K/uL Final         Failed - Lipid Panel in normal range within the last 12 months    Cholesterol, Total  Date Value Ref Range Status  10/04/2022 225 (H) 100 - 199 mg/dL Final   LDL Chol Calc (NIH)  Date Value Ref Range Status  10/04/2022 141 (H) 0 - 99 mg/dL Final   HDL  Date Value Ref Range Status  10/04/2022 60 >39 mg/dL Final   Triglycerides  Date Value Ref Range Status  10/04/2022 135 0 - 149 mg/dL Final         Passed - ALT in normal range and within 360 days    ALT  Date Value Ref Range Status  10/04/2022 21 0 - 32 IU/L Final   SGPT (ALT)  Date Value Ref Range Status  01/22/2013 28 12 - 78 U/L Final         Passed - AST in normal range and within 360 days    AST  Date Value Ref Range Status  10/04/2022 20 0 - 40 IU/L Final   SGOT(AST)  Date Value Ref Range Status  01/22/2013 33 15 - 37 Unit/L Final         Passed - Cr in normal range and within 360 days    Creatinine  Date Value Ref Range  Status  01/22/2013 0.86 0.60 - 1.30 mg/dL Final   Creatinine, Ser  Date Value Ref Range Status  10/04/2022 0.90 0.57 - 1.00 mg/dL Final   Creatinine,U  Date Value Ref Range Status  09/10/2014 65.8 mg/dL Final         Passed - eGFR is 30 or above and within 360 days    EGFR (African American)  Date Value Ref Range Status  01/22/2013 >60  Final   GFR calc Af Amer  Date Value Ref Range Status  07/03/2020 81 >59 mL/min/1.73 Final    Comment:    **In accordance with recommendations from the NKF-ASN Task force,**   [facility] is in the process of updating its eGFR calculation to the   2021 CKD-EPI creatinine equation that estimates kidney function   without a race variable.    EGFR (Non-African  [facility].)  Date Value Ref Range Status  01/22/2013 >60  Final    Comment:    eGFR values <48mL/min/1.73 m2 may be an indication of chronic kidney disease (CKD). Calculated eGFR is useful in patients with stable renal function. The eGFR calculation will not be reliable in acutely ill patients when serum creatinine is changing rapidly. It is not useful in  patients on dialysis. The eGFR calculation may not be applicable to patients at the low and high extremes of body sizes, pregnant women, and vegetarians.    GFR, Estimated  Date Value Ref Range Status  12/10/2021 >60 >60 mL/min Final    Comment:    (NOTE) Calculated using the CKD-EPI Creatinine Equation (2021)    GFR  Date Value Ref Range Status  04/28/2015 72.35 >60.00 mL/min Final   eGFR  Date Value Ref Range Status  10/04/2022 74 >59 mL/min/1.73 Final         Passed - Valid encounter within last 12 months    Recent Outpatient Visits           3 months ago Myalgia due to statin   [place] [facility] Mecum, Oswaldo Conroy, PA-C   5 months ago LLQ abdominal pain   [place] [facility] Mecum, Oswaldo Conroy, PA-C   5 months ago Annual physical exam   [place] [facility] Larae Grooms,  NP   8 months ago Hypertension associated with diabetes [facility]   [place] [facility] Larae Grooms, NP   11 months ago Hypertension associated with diabetes [facility])   [place] [facility] Larae Grooms, NP       Future Appointments             In 3 days Larae Grooms, NP [place] [facility], [facility]   In 2 weeks Larae Grooms, NP  [facility], [facility]

## 2023-03-27 ENCOUNTER — Ambulatory Visit (INDEPENDENT_AMBULATORY_CARE_PROVIDER_SITE_OTHER): Payer: PPO | Admitting: Nurse Practitioner

## 2023-03-27 ENCOUNTER — Encounter: Payer: Self-pay | Admitting: Nurse Practitioner

## 2023-03-27 VITALS — BP 126/81 | HR 69 | Temp 98.9°F | Ht 65.0 in | Wt 217.6 lb

## 2023-03-27 DIAGNOSIS — M791 Myalgia, unspecified site: Secondary | ICD-10-CM

## 2023-03-27 DIAGNOSIS — E1159 Type 2 diabetes mellitus with other circulatory complications: Secondary | ICD-10-CM

## 2023-03-27 DIAGNOSIS — E1169 Type 2 diabetes mellitus with other specified complication: Secondary | ICD-10-CM

## 2023-03-27 DIAGNOSIS — E785 Hyperlipidemia, unspecified: Secondary | ICD-10-CM

## 2023-03-27 DIAGNOSIS — E559 Vitamin D deficiency, unspecified: Secondary | ICD-10-CM

## 2023-03-27 DIAGNOSIS — I152 Hypertension secondary to endocrine disorders: Secondary | ICD-10-CM | POA: Diagnosis not present

## 2023-03-27 DIAGNOSIS — F32A Depression, unspecified: Secondary | ICD-10-CM | POA: Diagnosis not present

## 2023-03-27 DIAGNOSIS — R21 Rash and other nonspecific skin eruption: Secondary | ICD-10-CM | POA: Diagnosis not present

## 2023-03-27 DIAGNOSIS — E669 Obesity, unspecified: Secondary | ICD-10-CM | POA: Diagnosis not present

## 2023-03-27 DIAGNOSIS — J683 Other acute and subacute respiratory conditions due to chemicals, gases, fumes and vapors: Secondary | ICD-10-CM

## 2023-03-27 DIAGNOSIS — T466X5A Adverse effect of antihyperlipidemic and antiarteriosclerotic drugs, initial encounter: Secondary | ICD-10-CM

## 2023-03-27 DIAGNOSIS — F419 Anxiety disorder, unspecified: Secondary | ICD-10-CM | POA: Diagnosis not present

## 2023-03-27 MED ORDER — AMLODIPINE BESYLATE 10 MG PO TABS: 10.0000 mg | ORAL_TABLET | Freq: Every day | ORAL | 1 refills | Status: DC

## 2023-03-27 MED ORDER — CLOTRIMAZOLE-BETAMETHASONE 1-0.05 % EX CREA
1.0000 | TOPICAL_CREAM | Freq: Every day | CUTANEOUS | 0 refills | Status: DC
Start: 2023-03-27 — End: 2023-09-13

## 2023-03-27 MED ORDER — POTASSIUM CHLORIDE CRYS ER 20 MEQ PO TBCR: 20.0000 meq | EXTENDED_RELEASE_TABLET | Freq: Two times a day (BID) | ORAL | 1 refills | Status: DC

## 2023-03-27 MED ORDER — METOPROLOL SUCCINATE ER 25 MG PO TB24: 25.0000 mg | ORAL_TABLET | Freq: Every day | ORAL | 1 refills | Status: DC

## 2023-03-27 MED ORDER — HYDROCHLOROTHIAZIDE 25 MG PO TABS: 25.0000 mg | ORAL_TABLET | Freq: Every day | ORAL | 1 refills | Status: DC

## 2023-03-27 MED ORDER — CITALOPRAM HYDROBROMIDE 20 MG PO TABS: 20.0000 mg | ORAL_TABLET | Freq: Every day | ORAL | 1 refills | Status: DC

## 2023-03-27 MED ORDER — VALSARTAN 40 MG PO TABS: 40.0000 mg | ORAL_TABLET | Freq: Every day | ORAL | 1 refills | Status: DC

## 2023-03-27 NOTE — Assessment & Plan Note (Signed)
Labs ordered at visit today.  Will make recommendations based on lab results.   

## 2023-03-27 NOTE — Assessment & Plan Note (Signed)
Chronic. Exacerbated due to family loss.  Will restart Citalopram.  Patient has taken it in the past and done well.  Side effects and benefits discussed during visit.  Labs ordered.  Follow up in 2 months.

## 2023-03-27 NOTE — Assessment & Plan Note (Signed)
Recommended eating smaller high protein, low fat meals more frequently and exercising 30 mins a day 5 times a week with a goal of 10-15lb weight loss in the next 3 months.  

## 2023-03-27 NOTE — Assessment & Plan Note (Signed)
Did not tolerate rosuvastatin due to myalgias.

## 2023-03-27 NOTE — Progress Notes (Signed)
BP 126/81 (BP Location: Left Arm, Patient Position: Sitting, Cuff Size: Large)   Pulse 69   Temp 98.9 F (37.2 C) (Oral)   Ht 5\' 5"  (1.651 m)   Wt 217 lb 9.6 oz (98.7 kg)   LMP  (LMP Unknown)   SpO2 96%   BMI 36.21 kg/m    Subjective:    Patient ID: Hannah Calhoun, female    DOB: 08-12-1960, 62 y.o.   MRN: [id number]  HPI: Hannah Calhoun is a 62 y.o. female  Chief Complaint  Patient presents with   Follow-up    Medication and to see if she can get a referral for dermatology for possible eczema, Also would like to discuss something for the loss of brothers    HYPERTENSION / HYPERLIPIDEMIA Satisfied with current treatment? no Duration of hypertension: years BP monitoring frequency: not checkin BP range:  BP medication side effects: no Past BP meds:  metoprolol, amlodipine and HCTZ and valsartan Duration of hyperlipidemia: years Cholesterol medication side effects: no Cholesterol supplements: none Past cholesterol medications:no taking Medication compliance: excellent compliance Aspirin: no Recent stressors: no Recurrent headaches: no Visual changes: no Palpitations: no Dyspnea: no Chest pain: no Lower extremity edema: no Dizzy/lightheaded: no  The 10-year ASCVD risk score (Arnett DK, et al., 2019) is: 14.7%   Values used to calculate the score:     Age: 60 years     Sex: Female     Is Non-Hispanic African American: Yes     Diabetic: Yes     Tobacco smoker: No     Systolic Blood Pressure: 126 mmHg     Is BP treated: Yes     HDL Cholesterol: 60 mg/dL     Total Cholesterol: 225 mg/dL   DIABETES Hypoglycemic episodes:no Polydipsia/polyuria: no Visual disturbance: no Chest pain: no Paresthesias: yes Glucose Monitoring: yes  Accucheck frequency: not checking  Fasting glucose:   Post prandial:  Evening:  Before meals: Taking Insulin?: no  Long acting insulin:  Short acting insulin: Blood Pressure Monitoring: rarely Retinal Examination:  Up to Date- requested Foot Exam: Up to Date- had an appt with podiatry in a could of weeks. Diabetic Education: Not Completed Pneumovax: Up to Date Influenza: Up to Date Aspirin: no  ANXIETY/DEPRESSION Patient states she has had a rough year.  Two of her brothers have passed away.  She wants to cry a lot.  She would like to go back on medication.    She has had a rash on her foot, abdomen and thigh.  It does get itchy.  Dry, red patches.    Relevant past medical, surgical, family and social history reviewed and updated as indicated. Interim medical history since our last visit reviewed. Allergies and medications reviewed and updated.  Review of Systems  Eyes:  Negative for visual disturbance.  Respiratory:  Negative for cough, chest tightness and shortness of breath.   Cardiovascular:  Negative for chest pain, palpitations and leg swelling.  Skin:  Positive for rash.  Neurological:  Negative for dizziness and headaches.  Psychiatric/Behavioral:  Positive for dysphoric mood. Negative for suicidal ideas. The patient is not nervous/anxious.     Per HPI unless specifically indicated above     Objective:    BP 126/81 (BP Location: Left Arm, Patient Position: Sitting, Cuff Size: Large)   Pulse 69   Temp 98.9 F (37.2 C) (Oral)   Ht 5\' 5"  (1.651 m)   Wt 217 lb 9.6 oz (98.7 kg)   LMP  (  LMP Unknown)   SpO2 96%   BMI 36.21 kg/m   Wt Readings from Last 3 Encounters:  03/27/23 217 lb 9.6 oz (98.7 kg)  12/22/22 214 lb (97.1 kg)  10/14/22 212 lb (96.2 kg)    Physical Exam Vitals and nursing note reviewed.  Constitutional:      General: She is not in acute distress.    Appearance: Normal appearance. She is not ill-appearing, toxic-appearing or diaphoretic.  HENT:     Head: Normocephalic.     Right Ear: External ear normal.     Left Ear: External ear normal.     Nose: Nose normal.     Mouth/Throat:     Mouth: Mucous membranes are moist.     Pharynx: Oropharynx is clear.   Eyes:     General:        Right eye: No discharge.        Left eye: No discharge.     Extraocular Movements: Extraocular movements intact.     Conjunctiva/sclera: Conjunctivae normal.     Pupils: Pupils are equal, round, and reactive to light.  Cardiovascular:     Rate and Rhythm: Normal rate and regular rhythm.     Heart sounds: No murmur heard. Pulmonary:     Effort: Pulmonary effort is normal. No respiratory distress.     Breath sounds: Normal breath sounds. No wheezing or rales.  Musculoskeletal:     Cervical back: Normal range of motion and neck supple.  Skin:    General: Skin is warm and dry.     Capillary Refill: Capillary refill takes less than 2 seconds.       Neurological:     General: No focal deficit present.     Mental Status: She is alert and oriented to person, place, and time. Mental status is at baseline.  Psychiatric:        Mood and Affect: Mood normal.        Behavior: Behavior normal.        Thought Content: Thought content normal.        Judgment: Judgment normal.     Results for orders placed or performed in visit on 01/10/23  HM DIABETES EYE EXAM  Result Value Ref Range   HM Diabetic Eye Exam        Assessment & Plan:   Problem List Items Addressed This Visit       Cardiovascular and Mediastinum   Hypertension associated with diabetes (HCC)    Chronic.  Controlled. Continue with amlodipine, HCTZ, metoprolol and Valsartan 40mg  daily.  Refills sent today.  Labs ordered today.  Follow up in 6 months for reevaluation.  Call sooner if concerns arise.       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  Controlled.  Continue with current medication regimen.   Labs ordered today.  Return to clinic in 6 months for reevaluation.  Call sooner if concerns arise.          Endocrine   Type 2 diabetes mellitus with other specified complication (HCC)    Chronic, stable. Labs ordered today. Continue current regimen of Glipizide 5mg  daily.  A1c controlled at 6.7%.  Patient  would rather work on diet than add medication.  Continue with diet changes.  Eye exam up to date.  Dr. Alvester Morin in [place].  Follow up in 6 months.  Call sooner if concerns arise.      Relevant Medications   valsartan (DIOVAN) 40 MG tablet   Other Relevant Orders   HgB A1c   Hyperlipidemia associated with type 2 diabetes mellitus (HCC) - Primary    Chronic, ongoing Stop statin due to myalgias.  Continue Fenofibrate and fish oil for now Recheck lipid panel in about 6 months to assess changes      Relevant Medications   amLODipine (NORVASC) 10 MG tablet   hydrochlorothiazide (HYDRODIURIL) 25 MG tablet   metoprolol succinate (TOPROL-XL) 25 MG 24 hr tablet   valsartan (DIOVAN) 40 MG tablet   Other Relevant Orders   Lipid Profile     Other   Obesity (BMI 30-39.9)    Recommended eating smaller high protein, low fat meals more frequently and exercising 30 mins a day 5 times a week with a goal of 10-15lb weight loss in the next 3 months.       Anxiety and depression    Chronic. Exacerbated due to family loss.  Will restart Citalopram.  Patient has taken it in the past and done well.  Side effects and benefits discussed during visit.  Labs ordered.  Follow up in 2 months.        Relevant Medications   citalopram (CELEXA) 20 MG tablet   Vitamin D deficiency    Labs ordered at visit today.  Will make recommendations based on lab results.        Relevant Orders   Vitamin D (25 hydroxy)   Myalgia due to statin    Did not tolerate rosuvastatin due to myalgias.        Other Visit Diagnoses     Rash       Will treat with clobetasol cream.  If not improved, will send to  Dermatology.        Follow up plan: Return in about 2 months (around 05/27/2023) for Depression/Anxiety FU.

## 2023-03-27 NOTE — Assessment & Plan Note (Signed)
Chronic.  Controlled. Continue with amlodipine, HCTZ, metoprolol and Valsartan 40mg  daily.  Refills sent today.  Labs ordered today.  Follow up in 6 months for reevaluation.  Call sooner if concerns arise.

## 2023-03-27 NOTE — Assessment & Plan Note (Signed)
Chronic, ongoing Stop statin due to myalgias.  Continue Fenofibrate and fish oil for now Recheck lipid panel in about 6 months to assess changes

## 2023-03-27 NOTE — Assessment & Plan Note (Signed)
Chronic.  Controlled.  Continue with current medication regimen.  Labs ordered today.  Return to clinic in 6 months for reevaluation.  Call sooner if concerns arise.  ? ?

## 2023-03-27 NOTE — Assessment & Plan Note (Signed)
Chronic, stable. Labs ordered today. Continue current regimen of Glipizide 5mg  daily.  A1c controlled at 6.7%.  Patient would rather work on diet than add medication.  Continue with diet changes.  Eye exam up to date.  Dr. Alvester Morin in [place].  Follow up in 6 months.  Call sooner if concerns arise.

## 2023-03-28 LAB — COMPREHENSIVE METABOLIC PANEL
ALT: 22 [IU]/L (ref 0–32)
AST: 22 [IU]/L (ref 0–40)
Albumin: 4.3 g/dL (ref 3.8–4.9)
Alkaline Phosphatase: 70 [IU]/L (ref 44–121)
BUN/Creatinine Ratio: 16 (ref 9–23)
BUN: 14 mg/dL (ref 6–24)
Bilirubin Total: <0.2 mg/dL (ref 0.0–1.2)
CO2: 23 mmol/L (ref 20–29)
Calcium: 9.3 mg/dL (ref 8.7–10.2)
Chloride: 103 mmol/L (ref 96–106)
Creatinine, Ser: 0.89 mg/dL (ref 0.57–1.00)
Globulin, Total: 2 g/dL (ref 1.5–4.5)
Glucose: 87 mg/dL (ref 70–99)
Potassium: 3.6 mmol/L (ref 3.5–5.2)
Sodium: 142 mmol/L (ref 134–144)
Total Protein: 6.3 g/dL (ref 6.0–8.5)
eGFR: 75 mL/min/{1.73_m2} (ref >59)

## 2023-03-28 LAB — VITAMIN D 25 HYDROXY (VIT D DEFICIENCY, FRACTURES): Vit D, 25-Hydroxy: 48.6 ng/mL (ref 30.0–100.0)

## 2023-03-28 LAB — HEMOGLOBIN A1C
Est. average glucose Bld gHb Est-mCnc: 157 mg/dL
Hgb A1c MFr Bld: 7.1 % — ABNORMAL HIGH (ref 4.8–5.6)

## 2023-03-28 LAB — LIPID PANEL
Chol/HDL Ratio: 3.5 ratio (ref 0.0–4.4)
Cholesterol, Total: 198 mg/dL (ref 100–199)
HDL: 56 mg/dL (ref >39)
LDL Chol Calc (NIH): 120 mg/dL — ABNORMAL HIGH (ref 0–99)
Triglycerides: 124 mg/dL (ref 0–149)
VLDL Cholesterol Cal: 22 mg/dL (ref 5–40)

## 2023-04-10 ENCOUNTER — Ambulatory Visit: Payer: PPO | Admitting: Nurse Practitioner

## 2023-04-13 DIAGNOSIS — M205X2 Other deformities of toe(s) (acquired), left foot: Secondary | ICD-10-CM | POA: Diagnosis not present

## 2023-04-13 DIAGNOSIS — M79672 Pain in left foot: Secondary | ICD-10-CM | POA: Diagnosis not present

## 2023-05-01 ENCOUNTER — Encounter: Payer: Self-pay | Admitting: Nurse Practitioner

## 2023-05-01 DIAGNOSIS — R21 Rash and other nonspecific skin eruption: Secondary | ICD-10-CM

## 2023-05-18 DIAGNOSIS — E113393 Type 2 diabetes mellitus with moderate nonproliferative diabetic retinopathy without macular edema, bilateral: Secondary | ICD-10-CM | POA: Diagnosis not present

## 2023-05-30 ENCOUNTER — Encounter: Payer: Self-pay | Admitting: Nurse Practitioner

## 2023-05-30 ENCOUNTER — Ambulatory Visit (INDEPENDENT_AMBULATORY_CARE_PROVIDER_SITE_OTHER): Payer: Medicare Other | Admitting: Nurse Practitioner

## 2023-05-30 VITALS — BP 106/65 | HR 58 | Temp 98.6°F | Ht 65.0 in | Wt 217.8 lb

## 2023-05-30 DIAGNOSIS — F419 Anxiety disorder, unspecified: Secondary | ICD-10-CM | POA: Diagnosis not present

## 2023-05-30 DIAGNOSIS — F32A Depression, unspecified: Secondary | ICD-10-CM

## 2023-05-30 MED ORDER — ESCITALOPRAM OXALATE 10 MG PO TABS: 10.0000 mg | ORAL_TABLET | Freq: Every day | ORAL | 0 refills | Status: DC

## 2023-05-30 MED ORDER — GABAPENTIN 300 MG PO CAPS: ORAL_CAPSULE | ORAL | 1 refills | Status: DC

## 2023-05-30 NOTE — Progress Notes (Signed)
BP 106/65 (BP Location: Right Arm, Patient Position: Sitting, Cuff Size: Large)   Pulse (!) 58   Temp 98.6 F (37 C) (Oral)   Ht 5\' 5"  (1.651 m)   Wt 217 lb 12.8 oz (98.8 kg)   LMP  (LMP Unknown)   SpO2 97%   BMI 36.24 kg/m    Subjective:    Patient ID: Hannah Calhoun, female    DOB: [birth year], 63 y.o.   MRN: [id number]  HPI: Francella Crehan is a 63 y.o. female  Chief Complaint  Patient presents with   Anxiety   Depression   Medication Management    Celexa, causing cramps, had to stop taking, would like an alternative    ANXIETY/DEPRESSION Patient states she has had a rough year.  Two of her brothers have passed away.  She wants to cry a lot.  She felt like the citalopram was helpful when she was taking it but had to stop due to leg cramps.  She would like to try a different medication that will help her symptoms but not cause leg cramps.  Denies SI.     Relevant past medical, surgical, family and social history reviewed and updated as indicated. Interim medical history since our last visit reviewed. Allergies and medications reviewed and updated.  Review of Systems  Psychiatric/Behavioral:  Positive for dysphoric mood. Negative for suicidal ideas. The patient is nervous/anxious.     Per HPI unless specifically indicated above     Objective:    BP 106/65 (BP Location: Right Arm, Patient Position: Sitting, Cuff Size: Large)   Pulse (!) 58   Temp 98.6 F (37 C) (Oral)   Ht 5\' 5"  (1.651 m)   Wt 217 lb 12.8 oz (98.8 kg)   LMP  (LMP Unknown)   SpO2 97%   BMI 36.24 kg/m   Wt Readings from Last 3 Encounters:  05/30/23 217 lb 12.8 oz (98.8 kg)  03/27/23 217 lb 9.6 oz (98.7 kg)  12/22/22 214 lb (97.1 kg)    Physical Exam Vitals and nursing note reviewed.  Constitutional:      General: She is not in acute distress.    Appearance: Normal appearance. She is normal weight. She is not ill-appearing, toxic-appearing or diaphoretic.  HENT:     Head:  Normocephalic.     Right Ear: External ear normal.     Left Ear: External ear normal.     Nose: Nose normal.     Mouth/Throat:     Mouth: Mucous membranes are moist.     Pharynx: Oropharynx is clear.  Eyes:     General:        Right eye: No discharge.        Left eye: No discharge.     Extraocular Movements: Extraocular movements intact.     Conjunctiva/sclera: Conjunctivae normal.     Pupils: Pupils are equal, round, and reactive to light.  Cardiovascular:     Rate and Rhythm: Normal rate and regular rhythm.     Heart sounds: No murmur heard. Pulmonary:     Effort: Pulmonary effort is normal. No respiratory distress.     Breath sounds: Normal breath sounds. No wheezing or rales.  Musculoskeletal:     Cervical back: Normal range of motion and neck supple.  Skin:    General: Skin is warm and dry.     Capillary Refill: Capillary refill takes less than 2 seconds.  Neurological:     General: No focal deficit present.  Mental Status: She is alert and oriented to person, place, and time. Mental status is at baseline.  Psychiatric:        Mood and Affect: Mood normal.        Behavior: Behavior normal.        Thought Content: Thought content normal.        Judgment: Judgment normal.     Results for orders placed or performed in visit on 03/27/23  Comp Met (CMET)   Collection Time: 03/27/23  1:24 PM  Result Value Ref Range   Glucose 87 70 - 99 mg/dL   BUN 14 6 - 24 mg/dL   Creatinine, Ser 1.30 0.57 - 1.00 mg/dL   eGFR 75 >86 VH/QIO/9.62   BUN/Creatinine Ratio 16 9 - 23   Sodium 142 134 - 144 mmol/L   Potassium 3.6 3.5 - 5.2 mmol/L   Chloride 103 96 - 106 mmol/L   CO2 23 20 - 29 mmol/L   Calcium 9.3 8.7 - 10.2 mg/dL   Total Protein 6.3 6.0 - 8.5 g/dL   Albumin 4.3 3.8 - 4.9 g/dL   Globulin, Total 2.0 1.5 - 4.5 g/dL   Bilirubin Total <9.5 0.0 - 1.2 mg/dL   Alkaline Phosphatase 70 44 - 121 IU/L   AST 22 0 - 40 IU/L   ALT 22 0 - 32 IU/L  Lipid Profile   Collection  Time: 03/27/23  1:24 PM  Result Value Ref Range   Cholesterol, Total 198 100 - 199 mg/dL   Triglycerides 284 0 - 149 mg/dL   HDL 56 >13 mg/dL   VLDL Cholesterol Cal 22 5 - 40 mg/dL   LDL Chol Calc (NIH) 244 (H) 0 - 99 mg/dL   Chol/HDL Ratio 3.5 0.0 - 4.4 ratio  HgB A1c   Collection Time: 03/27/23  1:24 PM  Result Value Ref Range   Hgb A1c MFr Bld 7.1 (H) 4.8 - 5.6 %   Est. average glucose Bld gHb Est-mCnc 157 mg/dL  Vitamin D (25 hydroxy)   Collection Time: 03/27/23  1:24 PM  Result Value Ref Range   Vit D, 25-Hydroxy 48.6 30.0 - 100.0 ng/mL      Assessment & Plan:   Problem List Items Addressed This Visit       Other   Anxiety and depression - Primary   Chronic.  Not well controlled. Saw improvement with Celexa but caused leg cramps.  Will start Lexapro 10mg  daily.  Side effects and benefits of medication discussed during visit. Recommend increasing water intake.  Follow up 2 weeks.        Relevant Medications   escitalopram (LEXAPRO) 10 MG tablet     Follow up plan: Return in about 2 weeks (around 06/13/2023) for Depression/Anxiety FU.

## 2023-05-30 NOTE — Assessment & Plan Note (Signed)
Chronic.  Not well controlled. Saw improvement with Celexa but caused leg cramps.  Will start Lexapro 10mg  daily.  Side effects and benefits of medication discussed during visit. Recommend increasing water intake.  Follow up 2 weeks.

## 2023-05-31 ENCOUNTER — Encounter: Payer: Self-pay | Admitting: Nurse Practitioner

## 2023-06-02 ENCOUNTER — Other Ambulatory Visit: Payer: Self-pay | Admitting: Nurse Practitioner

## 2023-06-02 DIAGNOSIS — E1169 Type 2 diabetes mellitus with other specified complication: Secondary | ICD-10-CM

## 2023-06-02 DIAGNOSIS — T466X5A Adverse effect of antihyperlipidemic and antiarteriosclerotic drugs, initial encounter: Secondary | ICD-10-CM

## 2023-06-02 NOTE — Telephone Encounter (Signed)
Requested medication (s) are due for refill today: for next fil  Requested medication (s) are on the active medication list: yes  Last refill:  03/24/23 #90/0  Future visit scheduled: yes  Notes to clinic:  Unable to refill per protocol due to failed labs, no updated results.      Requested Prescriptions  Pending Prescriptions Disp Refills   fenofibrate 54 MG tablet [Pharmacy Med Name: FENOFIBRATE 54 MG TABLET] 90 tablet 0    Sig: TAKE 1 TABLET BY MOUTH EVERY DAY     Cardiovascular:  Antilipid - Fibric Acid Derivatives Failed - 06/02/2023  5:25 PM      Failed - HGB in normal range and within 360 days    Hemoglobin  Date Value Ref Range Status  12/10/2021 13.7 12.0 - 15.0 g/dL Final  16/02/9603 54.0 11.1 - 15.9 g/dL Final         Failed - HCT in normal range and within 360 days    HCT  Date Value Ref Range Status  12/10/2021 40.8 36.0 - 46.0 % Final   Hematocrit  Date Value Ref Range Status  07/05/2021 42.0 34.0 - 46.6 % Final         Failed - PLT in normal range and within 360 days    Platelets  Date Value Ref Range Status  12/10/2021 420 (H) 150 - 400 K/uL Final  07/05/2021 421 150 - 450 x10E3/uL Final         Failed - WBC in normal range and within 360 days    WBC  Date Value Ref Range Status  12/10/2021 10.8 (H) 4.0 - 10.5 K/uL Final         Failed - Lipid Panel in normal range within the last 12 months    Cholesterol, Total  Date Value Ref Range Status  03/27/2023 198 100 - 199 mg/dL Final   LDL Chol Calc (NIH)  Date Value Ref Range Status  03/27/2023 120 (H) 0 - 99 mg/dL Final   HDL  Date Value Ref Range Status  03/27/2023 56 >39 mg/dL Final   Triglycerides  Date Value Ref Range Status  03/27/2023 124 0 - 149 mg/dL Final         Passed - ALT in normal range and within 360 days    ALT  Date Value Ref Range Status  03/27/2023 22 0 - 32 IU/L Final   SGPT (ALT)  Date Value Ref Range Status  01/22/2013 28 12 - 78 U/L Final         Passed -  AST in normal range and within 360 days    AST  Date Value Ref Range Status  03/27/2023 22 0 - 40 IU/L Final   SGOT(AST)  Date Value Ref Range Status  01/22/2013 33 15 - 37 Unit/L Final         Passed - Cr in normal range and within 360 days    Creatinine  Date Value Ref Range Status  01/22/2013 0.86 0.60 - 1.30 mg/dL Final   Creatinine, Ser  Date Value Ref Range Status  03/27/2023 0.89 0.57 - 1.00 mg/dL Final   Creatinine,U  Date Value Ref Range Status  09/10/2014 65.8 mg/dL Final         Passed - eGFR is 30 or above and within 360 days    EGFR (African American)  Date Value Ref Range Status  01/22/2013 >60  Final   GFR calc Af Amer  Date Value Ref Range Status  07/03/2020  81 >59 mL/min/1.73 Final    Comment:    **In accordance with recommendations from the NKF-ASN Task force,**   [facility] is in the process of updating its eGFR calculation to the   2021 CKD-EPI creatinine equation that estimates kidney function   without a race variable.    EGFR (Non-African Amer.)  Date Value Ref Range Status  01/22/2013 >60  Final    Comment:    eGFR values <65mL/min/1.73 m2 may be an indication of chronic kidney disease (CKD). Calculated eGFR is useful in patients with stable renal function. The eGFR calculation will not be reliable in acutely ill patients when serum creatinine is changing rapidly. It is not useful in  patients on dialysis. The eGFR calculation may not be applicable to patients at the low and high extremes of body sizes, pregnant women, and vegetarians.    GFR, Estimated  Date Value Ref Range Status  12/10/2021 >60 >60 mL/min Final    Comment:    (NOTE) Calculated using the CKD-EPI Creatinine Equation (2021)    GFR  Date Value Ref Range Status  04/28/2015 72.35 >60.00 mL/min Final   eGFR  Date Value Ref Range Status  03/27/2023 75 >59 mL/min/1.73 Final         Passed - Valid encounter within last 12 months    Recent Outpatient Visits            3 days ago Anxiety and depression   [place] [facility] Larae Grooms, NP   2 months ago Hyperlipidemia associated with type 2 diabetes mellitus [facility])   [place] [facility] Larae Grooms, NP   5 months ago Myalgia due to statin   [place] [facility] Mecum, Oswaldo Conroy, PA-C   7 months ago LLQ abdominal pain   [place] [facility] Mecum, Oswaldo Conroy, PA-C   8 months ago Annual physical exam   [place] [facility] Larae Grooms, NP       Future Appointments             In 1 week Larae Grooms, NP  [facility]

## 2023-06-07 NOTE — Telephone Encounter (Signed)
Pt is calling to check on status of the other dermatology appt, no one has reached out to her to schedule appt. Please call and update her with information. Thank you

## 2023-06-13 ENCOUNTER — Ambulatory Visit (INDEPENDENT_AMBULATORY_CARE_PROVIDER_SITE_OTHER): Payer: Medicare Other | Admitting: Nurse Practitioner

## 2023-06-13 ENCOUNTER — Encounter: Payer: Self-pay | Admitting: Nurse Practitioner

## 2023-06-13 VITALS — BP 117/69 | HR 55 | Ht 65.0 in | Wt 220.8 lb

## 2023-06-13 DIAGNOSIS — F419 Anxiety disorder, unspecified: Secondary | ICD-10-CM

## 2023-06-13 DIAGNOSIS — F32A Depression, unspecified: Secondary | ICD-10-CM

## 2023-06-13 MED ORDER — ESCITALOPRAM OXALATE 10 MG PO TABS: 10.0000 mg | ORAL_TABLET | Freq: Every day | ORAL | 1 refills | Status: DC

## 2023-06-13 NOTE — Progress Notes (Signed)
 BP 117/69 (BP Location: Right Arm, Patient Position: Sitting, Cuff Size: Large)   Pulse (!) 55   Ht 5' 5 (1.651 m)   Wt 220 lb 12.8 oz (100.2 kg)   LMP  (LMP Unknown)   SpO2 96%   BMI 36.74 kg/m    Subjective:    Patient ID: Hannah Calhoun, female    DOB: 1960-12-29, 63 y.o.   MRN: [id number]  HPI: Hannah Calhoun is a 63 y.o. female  Chief Complaint  Patient presents with   Depression   Anxiety   ANXIETY/DEPRESSION Patient states she feels like the medication is helping her symptoms.  Feels like she is crying less than prior.  Feels like this is a good dose for her.  Denies SI.   Flowsheet Row Office Visit from 05/30/2023 in [facility]  PHQ-9 Total Score 3         05/30/2023    4:12 PM 03/27/2023    1:12 PM 12/22/2022    2:30 PM 10/04/2022    9:19 AM  GAD 7 : Generalized Anxiety Score  Nervous, Anxious, on Edge 0 0 0 0  Control/stop worrying 0 2 0 0  Worry too much - different things 0 2 0 0  Trouble relaxing 0 1 0 0  Restless 0 0 0 0  Easily annoyed or irritable 0 2 0 0  Afraid - awful might happen 0 1 0 0  Total GAD 7 Score 0 8 0 0  Anxiety Difficulty   Not difficult at all       Relevant past medical, surgical, family and social history reviewed and updated as indicated. Interim medical history since our last visit reviewed. Allergies and medications reviewed and updated.  Review of Systems  Psychiatric/Behavioral:  Positive for dysphoric mood. Negative for suicidal ideas. The patient is nervous/anxious.     Per HPI unless specifically indicated above     Objective:    BP 117/69 (BP Location: Right Arm, Patient Position: Sitting, Cuff Size: Large)   Pulse (!) 55   Ht 5' 5 (1.651 m)   Wt 220 lb 12.8 oz (100.2 kg)   LMP  (LMP Unknown)   SpO2 96%   BMI 36.74 kg/m   Wt Readings from Last 3 Encounters:  06/13/23 220 lb 12.8 oz (100.2 kg)  05/30/23 217 lb 12.8 oz (98.8 kg)  03/27/23 217 lb 9.6 oz (98.7 kg)     Physical Exam Vitals and nursing note reviewed.  Constitutional:      General: She is not in acute distress.    Appearance: Normal appearance. She is normal weight. She is not ill-appearing, toxic-appearing or diaphoretic.  HENT:     Head: Normocephalic.     Right Ear: External ear normal.     Left Ear: External ear normal.     Nose: Nose normal.     Mouth/Throat:     Mouth: Mucous membranes are moist.     Pharynx: Oropharynx is clear.  Eyes:     General:        Right eye: No discharge.        Left eye: No discharge.     Extraocular Movements: Extraocular movements intact.     Conjunctiva/sclera: Conjunctivae normal.     Pupils: Pupils are equal, round, and reactive to light.  Cardiovascular:     Rate and Rhythm: Normal rate and regular rhythm.     Heart sounds: No murmur heard. Pulmonary:  Effort: Pulmonary effort is normal. No respiratory distress.     Breath sounds: Normal breath sounds. No wheezing or rales.  Musculoskeletal:     Cervical back: Normal range of motion and neck supple.  Skin:    General: Skin is warm and dry.     Capillary Refill: Capillary refill takes less than 2 seconds.  Neurological:     General: No focal deficit present.     Mental Status: She is alert and oriented to person, place, and time. Mental status is at baseline.  Psychiatric:        Mood and Affect: Mood normal.        Behavior: Behavior normal.        Thought Content: Thought content normal.        Judgment: Judgment normal.     Results for orders placed or performed in visit on 03/27/23  Comp Met (CMET)   Collection Time: 03/27/23  1:24 PM  Result Value Ref Range   Glucose 87 70 - 99 mg/dL   BUN 14 6 - 24 mg/dL   Creatinine, Ser 9.10 0.57 - 1.00 mg/dL   eGFR 75 >40 fO/fpw/8.26   BUN/Creatinine Ratio 16 9 - 23   Sodium 142 134 - 144 mmol/L   Potassium 3.6 3.5 - 5.2 mmol/L   Chloride 103 96 - 106 mmol/L   CO2 23 20 - 29 mmol/L   Calcium  9.3 8.7 - 10.2 mg/dL   Total  Protein 6.3 6.0 - 8.5 g/dL   Albumin 4.3 3.8 - 4.9 g/dL   Globulin, Total 2.0 1.5 - 4.5 g/dL   Bilirubin Total <9.7 0.0 - 1.2 mg/dL   Alkaline Phosphatase 70 44 - 121 IU/L   AST 22 0 - 40 IU/L   ALT 22 0 - 32 IU/L  Lipid Profile   Collection Time: 03/27/23  1:24 PM  Result Value Ref Range   Cholesterol, Total 198 100 - 199 mg/dL   Triglycerides 875 0 - 149 mg/dL   HDL 56 >60 mg/dL   VLDL Cholesterol Cal 22 5 - 40 mg/dL   LDL Chol Calc (NIH) 879 (H) 0 - 99 mg/dL   Chol/HDL Ratio 3.5 0.0 - 4.4 ratio  HgB A1c   Collection Time: 03/27/23  1:24 PM  Result Value Ref Range   Hgb A1c MFr Bld 7.1 (H) 4.8 - 5.6 %   Est. average glucose Bld gHb Est-mCnc 157 mg/dL  Vitamin D  (25 hydroxy)   Collection Time: 03/27/23  1:24 PM  Result Value Ref Range   Vit D, 25-Hydroxy 48.6 30.0 - 100.0 ng/mL      Assessment & Plan:   Problem List Items Addressed This Visit       Other   Anxiety and depression - Primary   Chronic.  Improved with Lexapro  10mg .  Continue with current dose of medication. Follow up in 3 months.  Call sooner if concerns arise.       Relevant Medications   escitalopram  (LEXAPRO ) 10 MG tablet      Follow up plan: Return in about 3 months (around 09/10/2023) for HTN, HLD, DM2 FU.

## 2023-06-13 NOTE — Assessment & Plan Note (Signed)
Chronic.  Improved with Lexapro 10mg .  Continue with current dose of medication. Follow up in 3 months.  Call sooner if concerns arise.

## 2023-06-20 DIAGNOSIS — L400 Psoriasis vulgaris: Secondary | ICD-10-CM | POA: Diagnosis not present

## 2023-07-11 ENCOUNTER — Encounter: Payer: Self-pay | Admitting: Emergency Medicine

## 2023-07-18 ENCOUNTER — Ambulatory Visit: Payer: PPO

## 2023-07-18 VITALS — Ht 65.0 in | Wt 217.0 lb

## 2023-07-18 DIAGNOSIS — Z0000 Encounter for general adult medical examination without abnormal findings: Secondary | ICD-10-CM | POA: Diagnosis not present

## 2023-07-18 NOTE — Patient Instructions (Signed)
 Hannah Calhoun , Thank you for taking time to come for your Medicare Wellness Visit. I appreciate your ongoing commitment to your health goals. Please review the following plan we discussed and let me know if I can assist you in the future.   Referrals/Orders/Follow-Ups/Clinician Recommendations: Remember to update your vaccines.  This is a list of the screening recommended for you and due dates:  Health Maintenance  Topic Date Due   Pneumococcal Vaccination (3 of 3 - PPSV23 or PCV20) 12/24/2020   COVID-19 Vaccine (8 - 2024-25 season) 03/14/2023   Hemoglobin A1C  09/24/2023   Yearly kidney health urinalysis for diabetes  10/04/2023   Complete foot exam   10/04/2023   Eye exam for diabetics  12/01/2023   Mammogram  01/24/2024   DTaP/Tdap/Td vaccine (2 - Td or Tdap) 03/11/2024   Yearly kidney function blood test for diabetes  03/26/2024   Medicare Annual Wellness Visit  07/17/2024   Colon Cancer Screening  02/05/2032   Flu Shot  Completed   Hepatitis C Screening  Completed   HIV Screening  Completed   Zoster (Shingles) Vaccine  Completed   HPV Vaccine  Aged Out    Advanced directives: (Declined) Advance directive discussed with you today. Even though you declined this today, please call our office should you change your mind, and we can give you the proper paperwork for you to fill out.  Next Medicare Annual Wellness Visit scheduled for next year: Yes 07/23/24 @ 10:00

## 2023-07-18 NOTE — Progress Notes (Signed)
 Subjective:   Hannah Calhoun is a 63 y.o. who presents for a Medicare Wellness preventive visit.  Visit Complete: Virtual I connected with  [name] on 07/18/23 by a audio enabled telemedicine application and verified that I am speaking with the correct person using two identifiers.  Patient Location: Home  Provider Location: Office/Clinic  I discussed the limitations of evaluation and management by telemedicine. The patient expressed understanding and agreed to proceed.  Vital Signs: Because this visit was a virtual/telehealth visit, some criteria may be missing or patient reported. Any vitals not documented were not able to be obtained and vitals that have been documented are patient reported.  VideoDeclined- This patient declined Librarian, academic. Therefore the visit was completed with audio only.  AWV Questionnaire: Yes: Patient Medicare AWV questionnaire was completed by the patient on 07/17/23; I have confirmed that all information answered by patient is correct and no changes since this date.  Cardiac Risk Factors include: advanced age (>29men, >58 women);diabetes mellitus;obesity (BMI >30kg/m2);dyslipidemia;hypertension     Objective:    Today's Vitals   07/18/23 0932  Weight: 217 lb (98.4 kg)  Height: 5\' 5"  (1.651 m)   Body mass index is 36.11 kg/m.     07/18/2023    9:44 AM 06/27/2022   11:38 AM 02/04/2022    7:12 AM 12/10/2021    3:53 PM 06/15/2021   12:07 PM 04/03/2019    7:55 PM 08/31/2015    4:55 PM  Advanced Directives  Does Patient Have a Medical Advance Directive? No No No No No No No  Would patient like information on creating a medical advance directive? No - Patient declined No - Patient declined   No - Patient declined      Current Medications (verified) Outpatient Encounter Medications as of 07/18/2023  Medication Sig   acetaminophen (TYLENOL) 500 MG tablet Take 650 mg by mouth 2 (two) times daily.    amLODipine (NORVASC) 10 MG tablet Take 1 tablet (10 mg total) by mouth daily.   Cholecalciferol (VITAMIN D3) 50 MCG (2000 UT) TABS Take 1 tablet by mouth daily.   escitalopram (LEXAPRO) 10 MG tablet Take 1 tablet (10 mg total) by mouth daily.   fenofibrate 54 MG tablet TAKE 1 TABLET BY MOUTH EVERY DAY   gabapentin (NEURONTIN) 300 MG capsule TAKE 1 CAPSULE BY MOUTH THREE TIMES A DAY   glipiZIDE (GLUCOTROL) 5 MG tablet TAKE 1 TABLET BY MOUTH TWICE A DAY BEFORE A MEAL-NO MORE REFILLS WITHOUT OFFICE VISIT   hydrochlorothiazide (HYDRODIURIL) 25 MG tablet Take 1 tablet (25 mg total) by mouth daily.   metoprolol succinate (TOPROL-XL) 25 MG 24 hr tablet Take 1 tablet (25 mg total) by mouth daily.   Multiple Vitamin (MULTI-VITAMIN) tablet Take 1 tablet by mouth daily.   potassium chloride SA (KLOR-CON M20) 20 MEQ tablet Take 1 tablet (20 mEq total) by mouth 2 (two) times daily.   valsartan (DIOVAN) 40 MG tablet Take 1 tablet (40 mg total) by mouth daily.   clotrimazole-betamethasone (LOTRISONE) cream Apply 1 Application topically daily. (Patient not taking: Reported on 07/18/2023)   triamcinolone cream (KENALOG) 0.1 % APPLY TO AFFECTED AREA TWICE A DAY   No facility-administered encounter medications on file as of 07/18/2023.    Allergies (verified) Crestor [rosuvastatin], Doxycycline, Nsaids, Other, Tolmetin, Ace inhibitors, and Aspirin   History: Past Medical History:  Diagnosis Date   Allergy    Arthritis    Chicken pox    Depression  Diabetes mellitus without complication (HCC)    Diverticulitis    GERD (gastroesophageal reflux disease)    History of colon polyps    Hyperlipidemia    Hypertension    Past Surgical History:  Procedure Laterality Date   BREAST BIOPSY Right 1990   EXCISIONAL - NEG   BREAST SURGERY Right 1990   CHOLECYSTECTOMY     COLONOSCOPY WITH PROPOFOL N/A 02/04/2022   Procedure: COLONOSCOPY WITH PROPOFOL;  Surgeon: Wyline Mood, MD;  Location: [facility] END[facility]COPY;   Service: Gastroenterology;  Laterality: N/A;   JOINT REPLACEMENT     KNEE ARTHROSCOPY  2010   NASAL SEPTUM SURGERY  2010   TONSILLECTOMY     TOTAL ABDOMINAL HYSTERECTOMY  2009   TOTAL HIP ARTHROPLASTY     Family History  Problem Relation Age of Onset   Alcohol abuse Mother    Hyperlipidemia Mother    Heart disease Mother    Hypertension Mother    Heart attack Mother    Alcohol abuse Father    Hyperlipidemia Father    Heart disease Father    Hypertension Father    Heart attack Father    Alcohol abuse Maternal Aunt    Hypertension Maternal Aunt    Kidney disease Maternal Aunt    Alcohol abuse Maternal Uncle    Hypertension Maternal Uncle    Alcohol abuse Paternal Aunt    Birth defects Paternal Aunt        Colon, Breast, Lung   Heart disease Paternal Aunt    Breast cancer Paternal Aunt    Alcohol abuse Paternal Uncle    Heart disease Paternal Uncle    Stroke Maternal Grandmother    Hypertension Maternal Grandmother    Alcohol abuse Maternal Grandfather    Heart disease Maternal Grandfather    Hypertension Maternal Grandfather    Social History   Socioeconomic History   Marital status: Married    Spouse name: Not on file   Number of children: Not on file   Years of education: Not on file   Highest education level: Associate degree: occupational, Scientist, product/process development, or vocational program  Occupational History   Not on file  Tobacco Use   Smoking status: Former    Current packs/day: 0.00    Average packs/day: 0.5 packs/day for 15.0 years (7.5 ttl pk-yrs)    Types: Cigarettes    Start date: 06/12/1990    Quit date: 06/12/2005    Years since quitting: 18.1   Smokeless tobacco: Never   Tobacco comments:    quit 2007  Vaping Use   Vaping status: Never Used  Substance and Sexual Activity   Alcohol use: Not Currently    Alcohol/week: 0.0 standard drinks of alcohol   Drug use: No   Sexual activity: Not Currently  Other Topics Concern   Not on file  Social History Narrative    Married   Social Drivers of Health   Financial Resource Strain: Medium Risk (07/18/2023)   Overall Financial Resource Strain (CARDIA)    Difficulty of Paying Living Expenses: Somewhat hard  Food Insecurity: No Food Insecurity (07/18/2023)   Hunger Vital Sign    Worried About Running Out of Food in the Last Year: Never true    Ran Out of Food in the Last Year: Never true  Recent Concern: Food Insecurity - Food Insecurity Present (05/29/2023)   Hunger Vital Sign    Worried About Running Out of Food in the Last Year: Sometimes true    Ran Out of Food in  the Last Year: Never true  Transportation Needs: No Transportation Needs (07/18/2023)   PRAPARE - Administrator, Civil Service (Medical): No    Lack of Transportation (Non-Medical): No  Physical Activity: Insufficiently Active (07/18/2023)   Exercise Vital Sign    Days of Exercise per Week: 3 days    Minutes of Exercise per Session: 20 min  Stress: No Stress Concern Present (07/18/2023)   [employer] of Occupational Health - Occupational Stress Questionnaire    Feeling of Stress : Not at all  Social Connections: Socially Integrated (07/18/2023)   Social Connection and Isolation Panel [NHANES]    Frequency of Communication with Friends and Family: More than three times a week    Frequency of Social Gatherings with Friends and Family: Once a week    Attends Religious Services: More than 4 times per year    Active Member of [employer] or Organizations: Yes    Attends Engineer, structural: More than 4 times per year    Marital Status: Married    Tobacco Counseling Counseling given: Not Answered Tobacco comments: quit 2007    Clinical Intake:  Pre-visit preparation completed: Yes  Pain : No/denies pain     BMI - recorded: 36.11 Nutritional Status: BMI > 30  Obese Nutritional Risks: None Diabetes: Yes CBG done?: No Did pt. bring in CBG monitor from home?: No  How often do you need to have someone help  you when you read instructions, pamphlets, or other written materials from your doctor or pharmacy?: 1 - Never  Interpreter Needed?: No  Information entered by :: R. Jamien Casanova LPN   Activities of Daily Living     07/17/2023    6:49 AM  In your present state of health, do you have any difficulty performing the following activities:  Hearing? 0  Vision? 0  Difficulty concentrating or making decisions? 0  Walking or climbing stairs? 1  Dressing or bathing? 0  Doing errands, shopping? 0  Preparing Food and eating ? N  Using the Toilet? N  In the past six months, have you accidently leaked urine? N  Do you have problems with loss of bowel control? N  Managing your Medications? N  Managing your Finances? Y  Housekeeping or managing your Housekeeping? N    Patient Care Team: Larae Grooms, NP as PCP - General Zettie Pho, [facility] (Inactive) (Pharmacist) Wyline Mood, MD as Consulting Physician (Gastroenterology)  Indicate any recent Medical Services you may have received from other than [facility] providers in the past year (date may be approximate).     Assessment:   This is a routine wellness examination for Adasia.  Hearing/Vision screen Hearing Screening - Comments:: No issues Vision Screening - Comments:: readers   Goals Addressed             This Visit's Progress    Patient Stated       Wants to lose weight       Depression Screen     07/18/2023    9:38 AM 05/30/2023    4:12 PM 03/27/2023    1:12 PM 12/22/2022    2:30 PM 10/04/2022    9:19 AM 07/04/2022    8:18 AM 06/27/2022   11:37 AM  PHQ 2/9 Scores  PHQ - 2 Score 0 1 3 0 0 0 0  PHQ- 9 Score 3 3 7  0 0 0 0    Fall Risk     07/17/2023    6:49 AM 12/22/2022  2:30 PM 10/04/2022    9:19 AM 07/04/2022    8:17 AM 06/27/2022   11:39 AM  Fall Risk   Falls in the past year? 0 0 0 0 0  Number falls in past yr: 0 0  0 0  Injury with Fall? 0 0 0 0 0  Risk for fall due to : No Fall Risks No Fall Risks No Fall Risks  No Fall Risks No Fall Risks  Follow up Falls prevention discussed;Falls evaluation completed Falls evaluation completed Falls evaluation completed Falls evaluation completed Falls prevention discussed;Falls evaluation completed    MEDICARE RISK AT HOME:  Medicare Risk at Home Any stairs in or around the home?: (Patient-Rptd) Yes If so, are there any without handrails?: (Patient-Rptd) Yes Home free of loose throw rugs in walkways, pet beds, electrical cords, etc?: (Patient-Rptd) No Adequate lighting in your home to reduce risk of falls?: (Patient-Rptd) Yes Life alert?: (Patient-Rptd) No Use of a cane, walker or w/c?: (Patient-Rptd) No Grab bars in the bathroom?: (Patient-Rptd) No Shower chair or bench in shower?: (Patient-Rptd) No Elevated toilet seat or a handicapped toilet?: (Patient-Rptd) Yes  TIMED UP AND GO:  Was the test performed?  No  Cognitive Function: 6CIT completed        07/18/2023    9:45 AM 06/27/2022   11:39 AM  6CIT Screen  What Year? 0 points 0 points  What month? 0 points 0 points  What time? 0 points 0 points  Count back from 20 0 points 0 points  Months in reverse 0 points 0 points  Repeat phrase 0 points 2 points  Total Score 0 points 2 points    Immunizations Immunization History  Administered Date(s) Administered   Influenza Split 02/28/2014   Influenza,inj,Quad PF,6+ Mos 01/20/2015, 01/13/2016, 02/23/2017, 03/02/2018, 01/21/2019, 01/19/2021, 01/24/2022   Influenza-Unspecified 01/13/2016, 02/23/2017, 03/02/2018, 01/21/2019, 04/17/2020, 12/22/2022   Moderna Covid-19 Fall Seasonal Vaccine 34yrs & older 03/25/2022   Moderna Covid-19 Vaccine Bivalent Booster 49yrs & up 05/14/2021   Moderna SARS-COV2 Booster Vaccination 03/25/2022   Moderna Sars-Covid-2 Vaccination 03/03/2020, 05/14/2021   PFIZER(Purple Top)SARS-COV-2 Vaccination 08/05/2019, 08/27/2019, 01/17/2023   PPD Test 05/15/2020   Pneumococcal Conjugate-13 01/20/2015   Pneumococcal  Polysaccharide-23 12/25/2015   Tdap 03/11/2014   Zoster Recombinant(Shingrix) 09/03/2022, 11/26/2022    Screening Tests Health Maintenance  Topic Date Due   Pneumococcal Vaccine 61-34 Years old (3 of 3 - PPSV23 or PCV20) 12/24/2020   COVID-19 Vaccine (8 - 2024-25 season) 03/14/2023   HEMOGLOBIN A1C  09/24/2023   Diabetic kidney evaluation - Urine ACR  10/04/2023   FOOT EXAM  10/04/2023   OPHTHALMOLOGY EXAM  12/01/2023   MAMMOGRAM  01/24/2024   DTaP/Tdap/Td (2 - Td or Tdap) 03/11/2024   Diabetic kidney evaluation - eGFR measurement  03/26/2024   Medicare Annual Wellness (AWV)  07/17/2024   Colonoscopy  02/05/2032   INFLUENZA VACCINE  Completed   Hepatitis C Screening  Completed   HIV Screening  Completed   Zoster Vaccines- Shingrix  Completed   HPV VACCINES  Aged Out    Health Maintenance  Health Maintenance Due  Topic Date Due   Pneumococcal Vaccine 66-36 Years old (3 of 3 - PPSV23 or PCV20) 12/24/2020   COVID-19 Vaccine (8 - 2024-25 season) 03/14/2023   Health Maintenance Items Addressed: Discussed the need to update vaccines.   Additional Screening:  Vision Screening: Recommended annual ophthalmology exams for early detection of glaucoma and other disorders of the eye. Up to date [employer]  Screening: Recommended annual dental exams for proper oral hygiene  Community Resource Referral / Chronic Care Management: CRR required this visit?  No   CCM required this visit?  No     Plan:     I have personally reviewed and noted the following in the patient's chart:   Medical and social history Use of alcohol, tobacco or illicit drugs  Current medications and supplements including opioid prescriptions. Patient is not currently taking opioid prescriptions. Functional ability and status Nutritional status Physical activity Advanced directives List of other physicians Hospitalizations, surgeries, and ER visits in previous 12 months Vitals Screenings to  include cognitive, depression, and falls Referrals and appointments  In addition, I have reviewed and discussed with patient certain preventive protocols, quality metrics, and best practice recommendations. A written personalized care plan for preventive services as well as general preventive health recommendations were provided to patient.     Sydell Axon, LPN   1/61/0960   After Visit Summary: (MyChart) Due to this being a telephonic visit, the after visit summary with patients personalized plan was offered to patient via MyChart   Notes: Nothing significant to report at this time.

## 2023-07-25 DIAGNOSIS — L400 Psoriasis vulgaris: Secondary | ICD-10-CM | POA: Diagnosis not present

## 2023-09-13 ENCOUNTER — Encounter: Payer: Self-pay | Admitting: Nurse Practitioner

## 2023-09-13 ENCOUNTER — Ambulatory Visit: Payer: Medicare Other | Admitting: Nurse Practitioner

## 2023-09-13 VITALS — BP 113/71 | HR 60 | Ht 65.0 in | Wt 217.0 lb

## 2023-09-13 DIAGNOSIS — F32A Depression, unspecified: Secondary | ICD-10-CM

## 2023-09-13 DIAGNOSIS — E669 Obesity, unspecified: Secondary | ICD-10-CM

## 2023-09-13 DIAGNOSIS — M791 Myalgia, unspecified site: Secondary | ICD-10-CM

## 2023-09-13 DIAGNOSIS — Z794 Long term (current) use of insulin: Secondary | ICD-10-CM

## 2023-09-13 DIAGNOSIS — F419 Anxiety disorder, unspecified: Secondary | ICD-10-CM

## 2023-09-13 DIAGNOSIS — E1169 Type 2 diabetes mellitus with other specified complication: Secondary | ICD-10-CM | POA: Diagnosis not present

## 2023-09-13 DIAGNOSIS — Z1330 Encounter for screening examination for mental health and behavioral disorders, unspecified: Secondary | ICD-10-CM | POA: Diagnosis not present

## 2023-09-13 DIAGNOSIS — E785 Hyperlipidemia, unspecified: Secondary | ICD-10-CM | POA: Diagnosis not present

## 2023-09-13 DIAGNOSIS — E119 Type 2 diabetes mellitus without complications: Secondary | ICD-10-CM | POA: Diagnosis not present

## 2023-09-13 DIAGNOSIS — I152 Hypertension secondary to endocrine disorders: Secondary | ICD-10-CM | POA: Diagnosis not present

## 2023-09-13 DIAGNOSIS — E1159 Type 2 diabetes mellitus with other circulatory complications: Secondary | ICD-10-CM | POA: Diagnosis not present

## 2023-09-13 DIAGNOSIS — N1831 Chronic kidney disease, stage 3a: Secondary | ICD-10-CM

## 2023-09-13 LAB — MICROALBUMIN, URINE WAIVED
Creatinine, Urine Waived: 200 mg/dL (ref 10–300)
Microalb, Ur Waived: 10 mg/L (ref 0–19)
Microalb/Creat Ratio: <30 mg/g (ref <30)

## 2023-09-13 MED ORDER — VALSARTAN 40 MG PO TABS: 40.0000 mg | ORAL_TABLET | Freq: Every day | ORAL | 1 refills | Status: DC

## 2023-09-13 MED ORDER — AMLODIPINE BESYLATE 10 MG PO TABS: 10.0000 mg | ORAL_TABLET | Freq: Every day | ORAL | 1 refills | Status: DC

## 2023-09-13 MED ORDER — GLIPIZIDE 5 MG PO TABS: ORAL_TABLET | ORAL | 1 refills | Status: DC

## 2023-09-13 MED ORDER — METOPROLOL SUCCINATE ER 25 MG PO TB24: 25.0000 mg | ORAL_TABLET | Freq: Every day | ORAL | 1 refills | Status: DC

## 2023-09-13 MED ORDER — HYDROCHLOROTHIAZIDE 25 MG PO TABS: 25.0000 mg | ORAL_TABLET | Freq: Every day | ORAL | 1 refills | Status: DC

## 2023-09-13 MED ORDER — GABAPENTIN 300 MG PO CAPS: ORAL_CAPSULE | ORAL | 1 refills | Status: DC

## 2023-09-13 MED ORDER — ESCITALOPRAM OXALATE 10 MG PO TABS: 10.0000 mg | ORAL_TABLET | Freq: Every day | ORAL | 1 refills | Status: DC

## 2023-09-13 MED ORDER — POTASSIUM CHLORIDE CRYS ER 20 MEQ PO TBCR: 20.0000 meq | EXTENDED_RELEASE_TABLET | Freq: Two times a day (BID) | ORAL | 1 refills | Status: DC

## 2023-09-13 MED ORDER — FENOFIBRATE 54 MG PO TABS
54.0000 mg | ORAL_TABLET | Freq: Every day | ORAL | 1 refills | Status: DC
Start: 2023-09-13 — End: 2024-03-19

## 2023-09-13 NOTE — Assessment & Plan Note (Signed)
Chronic.  Controlled.  Continue with current medication regimen of Lexapro 10mg  daily.  Labs ordered today.  Return to clinic in 6 months for reevaluation.  Call sooner if concerns arise.  ? ?

## 2023-09-13 NOTE — Assessment & Plan Note (Signed)
 Chronic, ongoing Has statin intolerance due to myalgias.  Continue Fenofibrate  and fish oil for now Recheck lipid panel in about 6 months to assess changes

## 2023-09-13 NOTE — Progress Notes (Signed)
 BP 113/71 (BP Location: Left Arm, Patient Position: Sitting, Cuff Size: Large)   Pulse 60   Ht 5\' 5"  (1.651 m)   Wt 217 lb (98.4 kg)   LMP  (LMP Unknown)   SpO2 96%   BMI 36.11 kg/m    Subjective:    Patient ID: Hannah Calhoun, female    DOB: February 15, 1961, 63 y.o.   MRN: [id number]  HPI: Hannah Calhoun is a 63 y.o. female  Chief Complaint  Patient presents with  . Hypertension  . Diabetes   HYPERTENSION / HYPERLIPIDEMIA Satisfied with current treatment? no Duration of hypertension: years BP monitoring frequency: sometimes BP range: 120-130/70 BP medication side effects: no Past BP meds:  metoprolol , amlodipine  and HCTZ and valsartan  Duration of hyperlipidemia: years Cholesterol medication side effects: no Cholesterol supplements: none Past cholesterol medications:no taking Medication compliance: excellent compliance Aspirin: no Recent stressors: no Recurrent headaches: no Visual changes: no Palpitations: no Dyspnea: no Chest pain: no Lower extremity edema: no Dizzy/lightheaded: no  The 10-year ASCVD risk score (Arnett DK, et al., 2019) is: 10.8%   Values used to calculate the score:     Age: 42 years     Sex: Female     Is Non-Hispanic African American: Yes     Diabetic: Yes     Tobacco smoker: No     Systolic Blood Pressure: 113 mmHg     Is BP treated: Yes     HDL Cholesterol: 56 mg/dL     Total Cholesterol: 198 mg/dL   DIABETES Hypoglycemic episodes:no Polydipsia/polyuria: no Visual disturbance: no Chest pain: no Paresthesias: yes Glucose Monitoring: yes  Accucheck frequency: not checking  Fasting glucose:   Post prandial:  Evening:  Before meals: Taking Insulin?: no  Long acting insulin:  Short acting insulin: Blood Pressure Monitoring: rarely Retinal Examination: Up to Date- requested Foot Exam: Up to Date- had an appt with podiatry in a could of weeks. Diabetic Education: Not Completed Pneumovax: Up to Date Influenza: Up  to Date Aspirin: no  ANXIETY/DEPRESSION Patient states she has had a rough year.  Two of her brothers have passed away.  She wants to cry a lot.  She feels like the lexapro  is working well for her.  Denies concerns at visit today.    Relevant past medical, surgical, family and social history reviewed and updated as indicated. Interim medical history since our last visit reviewed. Allergies and medications reviewed and updated.  Review of Systems  Eyes:  Negative for visual disturbance.  Respiratory:  Negative for cough, chest tightness and shortness of breath.   Cardiovascular:  Negative for chest pain, palpitations and leg swelling.  Neurological:  Negative for dizziness and headaches.  Psychiatric/Behavioral:  Positive for dysphoric mood. Negative for suicidal ideas. The patient is not nervous/anxious.     Per HPI unless specifically indicated above     Objective:    BP 113/71 (BP Location: Left Arm, Patient Position: Sitting, Cuff Size: Large)   Pulse 60   Ht 5\' 5"  (1.651 m)   Wt 217 lb (98.4 kg)   LMP  (LMP Unknown)   SpO2 96%   BMI 36.11 kg/m   Wt Readings from Last 3 Encounters:  09/13/23 217 lb (98.4 kg)  07/18/23 217 lb (98.4 kg)  06/13/23 220 lb 12.8 oz (100.2 kg)    Physical Exam Vitals and nursing note reviewed.  Constitutional:      General: She is not in acute distress.    Appearance: Normal appearance.  She is obese. She is not ill-appearing, toxic-appearing or diaphoretic.  HENT:     Head: Normocephalic.     Right Ear: External ear normal.     Left Ear: External ear normal.     Nose: Nose normal.     Mouth/Throat:     Mouth: Mucous membranes are moist.     Pharynx: Oropharynx is clear.  Eyes:     General:        Right eye: No discharge.        Left eye: No discharge.     Extraocular Movements: Extraocular movements intact.     Conjunctiva/sclera: Conjunctivae normal.     Pupils: Pupils are equal, round, and reactive to light.  Cardiovascular:      Rate and Rhythm: Normal rate and regular rhythm.     Heart sounds: No murmur heard. Pulmonary:     Effort: Pulmonary effort is normal. No respiratory distress.     Breath sounds: Normal breath sounds. No wheezing or rales.  Musculoskeletal:     Cervical back: Normal range of motion and neck supple.  Skin:    General: Skin is warm and dry.     Capillary Refill: Capillary refill takes less than 2 seconds.       Neurological:     General: No focal deficit present.     Mental Status: She is alert and oriented to person, place, and time. Mental status is at baseline.  Psychiatric:        Mood and Affect: Mood normal.        Behavior: Behavior normal.        Thought Content: Thought content normal.        Judgment: Judgment normal.    Results for orders placed or performed in visit on 09/13/23  Microalbumin, Urine Waived   Collection Time: 09/13/23 10:38 AM  Result Value Ref Range   Microalb, Ur Waived 10 0 - 19 mg/L   Creatinine, Urine Waived 200 10 - 300 mg/dL   Microalb/Creat Ratio <30 <30 mg/g      Assessment & Plan:   Problem List Items Addressed This Visit       Cardiovascular and Mediastinum   Hypertension associated with diabetes (HCC) - Primary   Chronic.  Controlled. Continue with amlodipine , HCTZ, metoprolol  and Valsartan  40mg  daily.  Refills sent today.  Labs ordered today.  Follow up in 6 months for reevaluation.  Call sooner if concerns arise.       Relevant Medications   amLODipine  (NORVASC ) 10 MG tablet   fenofibrate  54 MG tablet   glipiZIDE  (GLUCOTROL ) 5 MG tablet   hydrochlorothiazide  (HYDRODIURIL ) 25 MG tablet   metoprolol  succinate (TOPROL -XL) 25 MG 24 hr tablet   valsartan  (DIOVAN ) 40 MG tablet   Other Relevant Orders   Comprehensive metabolic panel with GFR     Endocrine   Diabetes mellitus treated with insulin and oral medication (HCC) (Chronic)   Relevant Medications   glipiZIDE  (GLUCOTROL ) 5 MG tablet   valsartan  (DIOVAN ) 40 MG tablet   Type  2 diabetes mellitus with other specified complication (HCC)   Chronic, stable. Labs ordered today. Continue current regimen of Glipizide  5mg  daily.  A1c controlled at 7.1%.  Patient would rather work on diet than add medication.  Continue with diet changes.  Eye exam and foot exam up to date.  Microalbumin checked today.  Follow up in 6 months.  Call sooner if concerns arise.      Relevant Medications  glipiZIDE  (GLUCOTROL ) 5 MG tablet   valsartan  (DIOVAN ) 40 MG tablet   Other Relevant Orders   Microalbumin, Urine Waived (Completed)   Hemoglobin A1c   Hyperlipidemia associated with type 2 diabetes mellitus (HCC)   Chronic, ongoing Has statin intolerance due to myalgias.  Continue Fenofibrate  and fish oil for now Recheck lipid panel in about 6 months to assess changes      Relevant Medications   amLODipine  (NORVASC ) 10 MG tablet   fenofibrate  54 MG tablet   glipiZIDE  (GLUCOTROL ) 5 MG tablet   hydrochlorothiazide  (HYDRODIURIL ) 25 MG tablet   metoprolol  succinate (TOPROL -XL) 25 MG 24 hr tablet   valsartan  (DIOVAN ) 40 MG tablet   Other Relevant Orders   Lipid panel     Genitourinary   Stage 3a chronic kidney disease (HCC) (Chronic)   Chronic.  Controlled.  Continue with current medication regimen.  Keep A1c and blood pressure well controlled.  Labs ordered today.  Return to clinic in 6 months for reevaluation.  Call sooner if concerns arise.          Other   Anxiety and depression (Chronic)   Chronic.  Controlled.  Continue with current medication regimen of Lexapro  10mg  daily.  Labs ordered today.  Return to clinic in 6 months for reevaluation.  Call sooner if concerns arise.        Relevant Medications   escitalopram  (LEXAPRO ) 10 MG tablet   Obesity (BMI 30-39.9)   Recommended eating smaller high protein, low fat meals more frequently and exercising 30 mins a day 5 times a week with a goal of 10-15lb weight loss in the next 3 months.       Relevant Medications   glipiZIDE   (GLUCOTROL ) 5 MG tablet   Myalgia due to statin   Relevant Medications   fenofibrate  54 MG tablet   Other Visit Diagnoses       Encounter for behavioral health screening            Follow up plan: Return in about 6 months (around 03/15/2024) for Physical and Fasting labs.

## 2023-09-13 NOTE — Assessment & Plan Note (Signed)
 Chronic.  Controlled. Continue with amlodipine, HCTZ, metoprolol and Valsartan 40mg  daily.  Refills sent today.  Labs ordered today.  Follow up in 6 months for reevaluation.  Call sooner if concerns arise.

## 2023-09-13 NOTE — Assessment & Plan Note (Signed)
 Chronic, stable. Labs ordered today. Continue current regimen of Glipizide  5mg  daily.  A1c controlled at 7.1%.  Patient would rather work on diet than add medication.  Continue with diet changes.  Eye exam and foot exam up to date.  Microalbumin checked today.  Follow up in 6 months.  Call sooner if concerns arise.

## 2023-09-13 NOTE — Assessment & Plan Note (Signed)
 Recommended eating smaller high protein, low fat meals more frequently and exercising 30 mins a day 5 times a week with a goal of 10-15lb weight loss in the next 3 months.

## 2023-09-13 NOTE — Assessment & Plan Note (Signed)
 Chronic.  Controlled.  Continue with current medication regimen.  Keep A1c and blood pressure well controlled.  Labs ordered today.  Return to clinic in 6 months for reevaluation.  Call sooner if concerns arise.

## 2023-09-14 ENCOUNTER — Encounter: Payer: Self-pay | Admitting: Nurse Practitioner

## 2023-09-14 LAB — LIPID PANEL
Chol/HDL Ratio: 4.2 ratio (ref 0.0–4.4)
Cholesterol, Total: 211 mg/dL — ABNORMAL HIGH (ref 100–199)
HDL: 50 mg/dL (ref >39)
LDL Chol Calc (NIH): 131 mg/dL — ABNORMAL HIGH (ref 0–99)
Triglycerides: 166 mg/dL — ABNORMAL HIGH (ref 0–149)
VLDL Cholesterol Cal: 30 mg/dL (ref 5–40)

## 2023-09-14 LAB — COMPREHENSIVE METABOLIC PANEL WITH GFR
ALT: 19 IU/L (ref 0–32)
AST: 20 IU/L (ref 0–40)
Albumin: 4.1 g/dL (ref 3.8–4.9)
Alkaline Phosphatase: 59 IU/L (ref 44–121)
BUN/Creatinine Ratio: 14 (ref 12–28)
BUN: 13 mg/dL (ref 8–27)
Bilirubin Total: 0.2 mg/dL (ref 0.0–1.2)
CO2: 23 mmol/L (ref 20–29)
Calcium: 9.4 mg/dL (ref 8.7–10.3)
Chloride: 101 mmol/L (ref 96–106)
Creatinine, Ser: 0.93 mg/dL (ref 0.57–1.00)
Globulin, Total: 2.3 g/dL (ref 1.5–4.5)
Glucose: 183 mg/dL — ABNORMAL HIGH (ref 70–99)
Potassium: 3.8 mmol/L (ref 3.5–5.2)
Sodium: 141 mmol/L (ref 134–144)
Total Protein: 6.4 g/dL (ref 6.0–8.5)
eGFR: 70 mL/min/{1.73_m2} (ref >59)

## 2023-09-14 LAB — HEMOGLOBIN A1C
Est. average glucose Bld gHb Est-mCnc: 151 mg/dL
Hgb A1c MFr Bld: 6.9 % — ABNORMAL HIGH (ref 4.8–5.6)

## 2023-09-17 DIAGNOSIS — T162XXA Foreign body in left ear, initial encounter: Secondary | ICD-10-CM | POA: Diagnosis not present

## 2023-10-08 ENCOUNTER — Other Ambulatory Visit: Payer: Self-pay | Admitting: Nurse Practitioner

## 2023-10-09 NOTE — Telephone Encounter (Signed)
 Requested Prescriptions  Refused Prescriptions Disp Refills   clotrimazole -betamethasone  (LOTRISONE ) cream [Pharmacy Med Name: CLOTRIMAZOLE -BETAMETHASONE  CRM] 30 g 0    Sig: APPLY 1 APPLICATION TOPICALLY DAILY     Off-Protocol Failed - 10/09/2023  5:28 PM      Failed - Medication not assigned to a protocol, review manually.      Passed - Valid encounter within last 12 months    Recent Outpatient Visits           3 weeks ago Hypertension associated with diabetes [facility])   [place] [facility] Aileen Alexanders, NP   3 months ago Anxiety and depression   [place] [facility] Aileen Alexanders, NP

## 2023-11-16 DIAGNOSIS — E113393 Type 2 diabetes mellitus with moderate nonproliferative diabetic retinopathy without macular edema, bilateral: Secondary | ICD-10-CM | POA: Diagnosis not present

## 2023-11-28 DIAGNOSIS — M205X2 Other deformities of toe(s) (acquired), left foot: Secondary | ICD-10-CM | POA: Diagnosis not present

## 2024-01-10 ENCOUNTER — Other Ambulatory Visit: Payer: Self-pay | Admitting: Nurse Practitioner

## 2024-01-10 NOTE — Telephone Encounter (Signed)
 Too soon for refill. LRF 09/13/23 for 90 and 1 RF.  Requested Prescriptions  Pending Prescriptions Disp Refills   valsartan  (DIOVAN ) 40 MG tablet [Pharmacy Med Name: VALSARTAN  40 MG TABLET] 90 tablet 1    Sig: TAKE 1 TABLET BY MOUTH EVERY DAY     Cardiovascular:  Angiotensin Receptor Blockers Passed - 01/10/2024  3:49 PM      Passed - Cr in normal range and within 180 days    Creatinine  Date Value Ref Range Status  01/22/2013 0.86 0.60 - 1.30 mg/dL Final   Creatinine, Ser  Date Value Ref Range Status  09/13/2023 0.93 0.57 - 1.00 mg/dL Final         Passed - K in normal range and within 180 days    Potassium  Date Value Ref Range Status  09/13/2023 3.8 3.5 - 5.2 mmol/L Final  01/22/2013 3.1 (L) 3.5 - 5.1 mmol/L Final         Passed - Patient is not pregnant      Passed - Last BP in normal range    BP Readings from Last 1 Encounters:  09/13/23 113/71         Passed - Valid encounter within last 6 months    Recent Outpatient Visits           3 months ago Hypertension associated with diabetes [facility])   [place] [facility] Melvin Pao, NP   7 months ago Anxiety and depression   [place] [facility] Melvin Pao, NP               metoprolol  succinate (TOPROL -XL) 25 MG 24 hr tablet [Pharmacy Med Name: METOPROLOL  SUCC ER 25 MG TAB] 90 tablet 1    Sig: TAKE 1 TABLET (25 MG TOTAL) BY MOUTH DAILY.     Cardiovascular:  Beta Blockers Passed - 01/10/2024  3:49 PM      Passed - Last BP in normal range    BP Readings from Last 1 Encounters:  09/13/23 113/71         Passed - Last Heart Rate in normal range    Pulse Readings from Last 1 Encounters:  09/13/23 60         Passed - Valid encounter within last 6 months    Recent Outpatient Visits           3 months ago Hypertension associated with diabetes [facility])   [place] [facility] Melvin Pao, NP   7 months ago Anxiety and depression   [place] [facility], Pao, NP               glipiZIDE  (GLUCOTROL ) 5 MG tablet [Pharmacy Med Name: GLIPIZIDE  5 MG TABLET] 180 tablet 1    Sig: TAKE 1 TABLET BY MOUTH TWICE A DAY BEFORE A MEAL-NO MORE REFILLS WITHOUT OFFICE VISIT     Endocrinology:  Diabetes - Sulfonylureas Passed - 01/10/2024  3:49 PM      Passed - HBA1C is between 0 and 7.9 and within 180 days    HB A1C (BAYER DCA - WAIVED)  Date Value Ref Range Status  07/03/2020 7.5 (H) <7.0 % Final    Comment:                                          Diabetic Adult            <  7.0                                       Healthy Adult        4.3 - 5.7                                                           (DCCT/NGSP) American Diabetes Association's Summary of Glycemic Recommendations for Adults with Diabetes: Hemoglobin A1c <7.0%. More stringent glycemic goals (A1c <6.0%) may further reduce complications at the cost of increased risk of hypoglycemia.    Hgb A1c MFr Bld  Date Value Ref Range Status  09/13/2023 6.9 (H) 4.8 - 5.6 % Final    Comment:             Prediabetes: 5.7 - 6.4          Diabetes: >6.4          Glycemic control for adults with diabetes: <7.0          Passed - Cr in normal range and within 360 days    Creatinine  Date Value Ref Range Status  01/22/2013 0.86 0.60 - 1.30 mg/dL Final   Creatinine, Ser  Date Value Ref Range Status  09/13/2023 0.93 0.57 - 1.00 mg/dL Final         Passed - Valid encounter within last 6 months    Recent Outpatient Visits           3 months ago Hypertension associated with diabetes [facility])   [place] Melvin Pao, NP   7 months ago Anxiety and depression   [place] [facility] Melvin Pao, NP

## 2024-02-06 ENCOUNTER — Other Ambulatory Visit: Payer: Self-pay | Admitting: Nurse Practitioner

## 2024-02-06 DIAGNOSIS — Z1231 Encounter for screening mammogram for malignant neoplasm of breast: Secondary | ICD-10-CM

## 2024-02-14 ENCOUNTER — Ambulatory Visit
Admission: RE | Admit: 2024-02-14 | Discharge: 2024-02-14 | Disposition: A | Discharge status: Home / Self Care | Source: Ambulatory Visit | Attending: Nurse Practitioner | Admitting: Nurse Practitioner

## 2024-02-14 DIAGNOSIS — Z1231 Encounter for screening mammogram for malignant neoplasm of breast: Secondary | ICD-10-CM | POA: Diagnosis not present

## 2024-03-15 NOTE — Telephone Encounter (Signed)
 Patient does want to use [facility].

## 2024-03-19 ENCOUNTER — Other Ambulatory Visit: Payer: Self-pay

## 2024-03-19 DIAGNOSIS — M791 Myalgia, unspecified site: Secondary | ICD-10-CM

## 2024-03-19 DIAGNOSIS — E1169 Type 2 diabetes mellitus with other specified complication: Secondary | ICD-10-CM

## 2024-03-19 MED ORDER — GABAPENTIN 300 MG PO CAPS: ORAL_CAPSULE | ORAL | 1 refills | Status: DC

## 2024-03-19 MED ORDER — POTASSIUM CHLORIDE CRYS ER 20 MEQ PO TBCR: 20.0000 meq | EXTENDED_RELEASE_TABLET | Freq: Two times a day (BID) | ORAL | 1 refills | Status: AC

## 2024-03-19 MED ORDER — FENOFIBRATE 54 MG PO TABS: 54.0000 mg | ORAL_TABLET | Freq: Every day | ORAL | 1 refills | Status: AC

## 2024-03-19 MED ORDER — AMLODIPINE BESYLATE 10 MG PO TABS: 10.0000 mg | ORAL_TABLET | Freq: Every day | ORAL | 1 refills | Status: AC

## 2024-03-19 MED ORDER — GLIPIZIDE 5 MG PO TABS: ORAL_TABLET | ORAL | 1 refills | Status: DC

## 2024-03-19 MED ORDER — ESCITALOPRAM OXALATE 10 MG PO TABS: 10.0000 mg | ORAL_TABLET | Freq: Every day | ORAL | 1 refills | Status: DC

## 2024-03-19 MED ORDER — METOPROLOL SUCCINATE ER 25 MG PO TB24: 25.0000 mg | ORAL_TABLET | Freq: Every day | ORAL | 1 refills | Status: AC

## 2024-03-19 MED ORDER — HYDROCHLOROTHIAZIDE 25 MG PO TABS: 25.0000 mg | ORAL_TABLET | Freq: Every day | ORAL | 1 refills | Status: AC

## 2024-03-19 MED ORDER — VALSARTAN 40 MG PO TABS: 40.0000 mg | ORAL_TABLET | Freq: Every day | ORAL | 1 refills | Status: AC

## 2024-03-19 NOTE — Telephone Encounter (Signed)
 Patient now using mail order pharmacy and needs her medications sent to them

## 2024-03-20 ENCOUNTER — Encounter: Payer: Self-pay | Admitting: Nurse Practitioner

## 2024-03-20 ENCOUNTER — Ambulatory Visit: Admitting: Nurse Practitioner

## 2024-03-20 VITALS — BP 124/66 | HR 59 | Temp 98.2°F | Ht 65.6 in | Wt 230.0 lb

## 2024-03-20 DIAGNOSIS — N1831 Chronic kidney disease, stage 3a: Secondary | ICD-10-CM | POA: Diagnosis not present

## 2024-03-20 DIAGNOSIS — E119 Type 2 diabetes mellitus without complications: Secondary | ICD-10-CM

## 2024-03-20 DIAGNOSIS — I152 Hypertension secondary to endocrine disorders: Secondary | ICD-10-CM

## 2024-03-20 DIAGNOSIS — E1159 Type 2 diabetes mellitus with other circulatory complications: Secondary | ICD-10-CM | POA: Diagnosis not present

## 2024-03-20 DIAGNOSIS — E1122 Type 2 diabetes mellitus with diabetic chronic kidney disease: Secondary | ICD-10-CM | POA: Diagnosis not present

## 2024-03-20 DIAGNOSIS — T148XXA Other injury of unspecified body region, initial encounter: Secondary | ICD-10-CM

## 2024-03-20 DIAGNOSIS — E1169 Type 2 diabetes mellitus with other specified complication: Secondary | ICD-10-CM

## 2024-03-20 DIAGNOSIS — Z0000 Encounter for general adult medical examination without abnormal findings: Secondary | ICD-10-CM | POA: Diagnosis not present

## 2024-03-20 DIAGNOSIS — Z23 Encounter for immunization: Secondary | ICD-10-CM | POA: Diagnosis not present

## 2024-03-20 DIAGNOSIS — F32A Depression, unspecified: Secondary | ICD-10-CM

## 2024-03-20 DIAGNOSIS — T466X5A Adverse effect of antihyperlipidemic and antiarteriosclerotic drugs, initial encounter: Secondary | ICD-10-CM

## 2024-03-20 DIAGNOSIS — F419 Anxiety disorder, unspecified: Secondary | ICD-10-CM

## 2024-03-20 DIAGNOSIS — E785 Hyperlipidemia, unspecified: Secondary | ICD-10-CM

## 2024-03-20 DIAGNOSIS — M791 Myalgia, unspecified site: Secondary | ICD-10-CM

## 2024-03-20 DIAGNOSIS — Z7984 Long term (current) use of oral hypoglycemic drugs: Secondary | ICD-10-CM

## 2024-03-20 LAB — MICROALBUMIN, URINE WAIVED
Creatinine, Urine Waived: 100 mg/dL (ref 10–300)
Microalb, Ur Waived: 10 mg/L (ref 0–19)
Microalb/Creat Ratio: <30 mg/g (ref <30)

## 2024-03-20 NOTE — Progress Notes (Signed)
 BP 124/66   Pulse (!) 59   Temp 98.2 F (36.8 C) (Oral)   Ht 5' 5.6 (1.666 m)   Wt 230 lb (104.3 kg)   LMP  (LMP Unknown)   SpO2 97%   BMI 37.58 kg/m    Subjective:    Patient ID: Hannah Calhoun, female    DOB: 09-11-1960, 63 y.o.   MRN: [id number]  HPI: Hannah Calhoun is a 63 y.o. female presenting on 03/20/2024 for comprehensive medical examination. Current medical complaints include:none  She currently lives with: Menopausal Symptoms: no  HYPERTENSION / HYPERLIPIDEMIA Satisfied with current treatment? no Duration of hypertension: years BP monitoring frequency: sometimes BP range: 130/70 BP medication side effects: no Past BP meds: metoprolol , amlodipine  and HCTZ and valsartan  Duration of hyperlipidemia: years Cholesterol medication side effects: no Cholesterol supplements: none Past cholesterol medications:no taking Medication compliance: excellent compliance Aspirin: no Recent stressors: no Recurrent headaches: no Visual changes: no Palpitations: no Dyspnea: no Chest pain: no Lower extremity edema: no Dizzy/lightheaded: no  The 10-year ASCVD risk score (Arnett DK, et al., 2019) is: 10.8%   Values used to calculate the score:     Age: 73 years     Sex: Female     Is Non-Hispanic African American: Yes     Diabetic: Yes     Tobacco smoker: No     Systolic Blood Pressure: 113 mmHg     Is BP treated: Yes     HDL Cholesterol: 56 mg/dL     Total Cholesterol: 198 mg/dL   DIABETES Taking Glipizide  5mg  BID.  Hypoglycemic episodes:no Polydipsia/polyuria: no Visual disturbance: no Chest pain: no Paresthesias: yes Glucose Monitoring: yes  Accucheck frequency: not checking  Fasting glucose:   Post prandial:  Evening:  Before meals: Taking Insulin?: no  Long acting insulin:  Short acting insulin: Blood Pressure Monitoring: rarely Retinal Examination: Up to Date- requested Foot Exam: Up to Date Diabetic Education: Not  Completed Pneumovax: Up to Date Influenza: Up to Date Aspirin: no  ANXIETY/DEPRESSION Patient states she has been a lot better.  After increasing the lexapro  dose she has been much better.  Denies concerns at visit today.    Depression Screen done today and results listed below:     03/20/2024    8:26 AM 09/13/2023   10:19 AM 07/18/2023    9:38 AM 05/30/2023    4:12 PM 03/27/2023    1:12 PM  Depression screen PHQ 2/9  Decreased Interest 0 0 0 0 1  Down, Depressed, Hopeless 0 0 0 1 2  PHQ - 2 Score 0 0 0 1 3  Altered sleeping 0 0 1 1 1   Tired, decreased energy 0 0   1  Change in appetite 0 0 1 1 1   Feeling bad or failure about yourself  0 0   0  Trouble concentrating 0 0 1 0 1  Moving slowly or fidgety/restless 0 0 0 0 0  Suicidal thoughts 0 0 0 0 0  PHQ-9 Score 0 0  3  3  7    Difficult doing work/chores Not difficult at all         Data saved with a previous flowsheet row definition    The patient does not have a history of falls. I did complete a risk assessment for falls. A plan of care for falls was documented.   Past Medical History:  Past Medical History:  Diagnosis Date   Allergy    Anxiety 1996  Arthritis    Cataract 06/2014   Chicken pox    Clotting disorder    Depression    Diabetes mellitus without complication (HCC) 2010   Diverticulitis    GERD (gastroesophageal reflux disease)    History of colon polyps    Hyperlipidemia    Hypertension    Ulcer 2009    Surgical History:  Past Surgical History:  Procedure Laterality Date   BREAST EXCISIONAL BIOPSY Right    1990's   BREAST SURGERY  1990   CHOLECYSTECTOMY  2010   COLONOSCOPY WITH PROPOFOL  N/A 02/04/2022   Procedure: COLONOSCOPY WITH PROPOFOL ;  Surgeon: Therisa Bi, MD;  Location[facility] ENDOSCOPY;  Service: Gastroenterology;  Laterality: N/A;   JOINT REPLACEMENT  rt/left hips 2017   KNEE ARTHROSCOPY  2010   NASAL SEPTUM SURGERY  2010   TONSILLECTOMY     TOTAL ABDOMINAL HYSTERECTOMY  2009    TOTAL HIP ARTHROPLASTY      Medications:  Current Outpatient Medications on File Prior to Visit  Medication Sig   acetaminophen  (TYLENOL ) 500 MG tablet Take 650 mg by mouth 2 (two) times daily.   amLODipine  (NORVASC ) 10 MG tablet Take 1 tablet (10 mg total) by mouth daily.   Cholecalciferol (VITAMIN D3) 50 MCG (2000 UT) TABS Take 1 tablet by mouth daily.   clobetasol cream (TEMOVATE) 0.05 % Apply topically.   escitalopram  (LEXAPRO ) 10 MG tablet Take 1 tablet (10 mg total) by mouth daily.   fenofibrate  54 MG tablet Take 1 tablet (54 mg total) by mouth daily.   gabapentin  (NEURONTIN ) 300 MG capsule TAKE 1 CAPSULE BY MOUTH THREE TIMES A DAY   glipiZIDE  (GLUCOTROL ) 5 MG tablet TAKE 1 TABLET BY MOUTH TWICE A DAY BEFORE A MEAL-NO MORE REFILLS WITHOUT OFFICE VISIT   hydrochlorothiazide  (HYDRODIURIL ) 25 MG tablet Take 1 tablet (25 mg total) by mouth daily.   metoprolol  succinate (TOPROL -XL) 25 MG 24 hr tablet Take 1 tablet (25 mg total) by mouth daily.   Multiple Vitamin (MULTI-VITAMIN) tablet Take 1 tablet by mouth daily.   potassium chloride  SA (KLOR-CON  M20) 20 MEQ tablet Take 1 tablet (20 mEq total) by mouth 2 (two) times daily.   valsartan  (DIOVAN ) 40 MG tablet Take 1 tablet (40 mg total) by mouth daily.   No current facility-administered medications on file prior to visit.    Allergies:  Allergies  Allergen Reactions   Crestor  [Rosuvastatin ] Other (See Comments)    myalgia   Doxycycline Diarrhea and Nausea And Vomiting   Nsaids Other (See Comments)    Bleeding ulcer   Other Other (See Comments)   Tolmetin     Other reaction(s): Other (See Comments) Bleeding ulcer   Ace Inhibitors Other (See Comments) and Rash    Significant kidney insufficiency-  each time lisinopril  restarted Significant AKI each time lisinopril  restarted    Aspirin Rash    Bleeding ulcers    Social History:  Social History   Socioeconomic History   Marital status: Married    Spouse name: Not on file    Number of children: Not on file   Years of education: Not on file   Highest education level: Associate degree: occupational, scientist, product/process development, or vocational program  Occupational History   Not on file  Tobacco Use   Smoking status: Former    Current packs/day: 0.00    Average packs/day: 0.5 packs/day for 15.0 years (7.5 ttl pk-yrs)    Types: Cigarettes    Start date: 06/12/1990    Quit date:  06/12/2005    Years since quitting: 18.7   Smokeless tobacco: Never   Tobacco comments:    quit 2007  Vaping Use   Vaping status: Never Used  Substance and Sexual Activity   Alcohol use: Yes    Alcohol/week: 1.0 standard drink of alcohol    Types: 1 Shots of liquor per week   Drug use: No   Sexual activity: Not Currently    Birth control/protection: Post-menopausal  Other Topics Concern   Not on file  Social History Narrative   Married   Social Drivers of Health   Financial Resource Strain: Medium Risk (03/13/2024)   Overall Financial Resource Strain (CARDIA)    Difficulty of Paying Living Expenses: Somewhat hard  Food Insecurity: No Food Insecurity (03/13/2024)   Hunger Vital Sign    Worried About Running Out of Food in the Last Year: Never true    Ran Out of Food in the Last Year: Never true  Transportation Needs: Unmet Transportation Needs (03/13/2024)   PRAPARE - Transportation    Lack of Transportation (Medical): No    Lack of Transportation (Non-Medical): Yes  Physical Activity: Inactive (03/13/2024)   Exercise Vital Sign    Days of Exercise per Week: 0 days    Minutes of Exercise per Session: Not on file  Stress: No Stress Concern Present (03/13/2024)   [employer] of Occupational Health - Occupational Stress Questionnaire    Feeling of Stress: Not at all  Social Connections: Socially Integrated (03/13/2024)   Social Connection and Isolation Panel    Frequency of Communication with Friends and Family: More than three times a week    Frequency of Social Gatherings with Friends and  Family: Once a week    Attends Religious Services: More than 4 times per year    Active Member of [employer] or Organizations: Yes    Attends Banker Meetings: 1 to 4 times per year    Marital Status: Married  Catering Manager Violence: Not At Risk (07/18/2023)   Humiliation, Afraid, Rape, and Kick questionnaire    Fear of Current or Ex-Partner: No    Emotionally Abused: No    Physically Abused: No    Sexually Abused: No   Social History   Tobacco Use  Smoking Status Former   Current packs/day: 0.00   Average packs/day: 0.5 packs/day for 15.0 years (7.5 ttl pk-yrs)   Types: Cigarettes   Start date: 06/12/1990   Quit date: 06/12/2005   Years since quitting: 18.7  Smokeless Tobacco Never  Tobacco Comments   quit 2007   Social History   Substance and Sexual Activity  Alcohol Use Yes   Alcohol/week: 1.0 standard drink of alcohol   Types: 1 Shots of liquor per week    Family History:  Family History  Problem Relation Age of Onset   Alcohol abuse Mother    Hyperlipidemia Mother    Heart disease Mother    Hypertension Mother    Heart attack Mother    Obesity Mother    Alcohol abuse Father    Hyperlipidemia Father    Heart disease Father    Hypertension Father    Heart attack Father    COPD Father    Alcohol abuse Maternal Aunt    Hypertension Maternal Aunt    Kidney disease Maternal Aunt    Obesity Maternal Aunt    Alcohol abuse Maternal Uncle    Hypertension Maternal Uncle    Alcohol abuse Paternal Aunt    Birth defects  Paternal Aunt        Colon, Breast, Lung   Heart disease Paternal Aunt    Breast cancer Paternal Aunt    Cancer Paternal Aunt    Breast cancer Paternal Aunt    Alcohol abuse Paternal Uncle    Heart disease Paternal Uncle    Stroke Maternal Grandmother    Hypertension Maternal Grandmother    Alcohol abuse Maternal Grandfather    Heart disease Maternal Grandfather    Hypertension Maternal Grandfather    Arthritis Maternal Aunt    Asthma  Daughter     Past medical history, surgical history, medications, allergies, family history and social history reviewed with patient today and changes made to appropriate areas of the chart.   Review of Systems  HENT:         Denies vision changes.  Eyes:  Negative for blurred vision and double vision.  Respiratory:  Negative for shortness of breath.   Cardiovascular:  Negative for chest pain, palpitations and leg swelling.  Neurological:  Negative for dizziness, tingling and headaches.  Endo/Heme/Allergies:  Negative for polydipsia.       Denies Polyuria  Psychiatric/Behavioral:  Positive for depression. Negative for suicidal ideas. The patient is nervous/anxious.    All other ROS negative except what is listed above and in the HPI.      Objective:    BP 124/66   Pulse (!) 59   Temp 98.2 F (36.8 C) (Oral)   Ht 5' 5.6 (1.666 m)   Wt 230 lb (104.3 kg)   LMP  (LMP Unknown)   SpO2 97%   BMI 37.58 kg/m   Wt Readings from Last 3 Encounters:  03/20/24 230 lb (104.3 kg)  09/13/23 217 lb (98.4 kg)  07/18/23 217 lb (98.4 kg)    Physical Exam Vitals and nursing note reviewed.  Constitutional:      General: She is awake. She is not in acute distress.    Appearance: Normal appearance. She is well-developed. She is obese. She is not ill-appearing.  HENT:     Head: Normocephalic and atraumatic.     Right Ear: Hearing, tympanic membrane, ear canal and external ear normal. No drainage.     Left Ear: Hearing, tympanic membrane, ear canal and external ear normal. No drainage.     Nose: Nose normal.     Right Sinus: No maxillary sinus tenderness or frontal sinus tenderness.     Left Sinus: No maxillary sinus tenderness or frontal sinus tenderness.     Mouth/Throat:     Mouth: Mucous membranes are moist.     Pharynx: Oropharynx is clear. Uvula midline. No pharyngeal swelling, oropharyngeal exudate or posterior oropharyngeal erythema.  Eyes:     General: Lids are normal.         Right eye: No discharge.        Left eye: No discharge.     Extraocular Movements: Extraocular movements intact.     Conjunctiva/sclera: Conjunctivae normal.     Pupils: Pupils are equal, round, and reactive to light.     Visual Fields: Right eye visual fields normal and left eye visual fields normal.  Neck:     Thyroid : No thyromegaly.     Vascular: No carotid bruit.     Trachea: Trachea normal.  Cardiovascular:     Rate and Rhythm: Normal rate and regular rhythm.     Heart sounds: Normal heart sounds. No murmur heard.    No gallop.  Pulmonary:  Effort: Pulmonary effort is normal. No accessory muscle usage or respiratory distress.     Breath sounds: Normal breath sounds.  Chest:  Breasts:    Right: Normal.     Left: Normal.  Abdominal:     General: Bowel sounds are normal.     Palpations: Abdomen is soft. There is no hepatomegaly or splenomegaly.     Tenderness: There is no abdominal tenderness.  Musculoskeletal:        General: Normal range of motion.     Cervical back: Normal range of motion and neck supple.     Right lower leg: No edema.     Left lower leg: No edema.  Lymphadenopathy:     Head:     Right side of head: No submental, submandibular, tonsillar, preauricular or posterior auricular adenopathy.     Left side of head: No submental, submandibular, tonsillar, preauricular or posterior auricular adenopathy.     Cervical: No cervical adenopathy.     Upper Body:     Right upper body: No supraclavicular, axillary or pectoral adenopathy.     Left upper body: No supraclavicular, axillary or pectoral adenopathy.  Skin:    General: Skin is warm and dry.     Capillary Refill: Capillary refill takes less than 2 seconds.     Findings: No rash.  Neurological:     Mental Status: She is alert and oriented to person, place, and time.     Gait: Gait is intact.  Psychiatric:        Attention and Perception: Attention normal.        Mood and Affect: Mood normal.         Speech: Speech normal.        Behavior: Behavior normal. Behavior is cooperative.        Thought Content: Thought content normal.        Judgment: Judgment normal.     Results for orders placed or performed in visit on 09/13/23  Microalbumin, Urine Waived   Collection Time: 09/13/23 10:38 AM  Result Value Ref Range   Microalb, Ur Waived 10 0 - 19 mg/L   Creatinine, Urine Waived 200 10 - 300 mg/dL   Microalb/Creat Ratio <30 <30 mg/g  Comprehensive metabolic panel with GFR   Collection Time: 09/13/23 10:39 AM  Result Value Ref Range   Glucose 183 (H) 70 - 99 mg/dL   BUN 13 8 - 27 mg/dL   Creatinine, Ser 9.06 0.57 - 1.00 mg/dL   eGFR 70 >40 fO/fpw/8.26   BUN/Creatinine Ratio 14 12 - 28   Sodium 141 134 - 144 mmol/L   Potassium 3.8 3.5 - 5.2 mmol/L   Chloride 101 96 - 106 mmol/L   CO2 23 20 - 29 mmol/L   Calcium  9.4 8.7 - 10.3 mg/dL   Total Protein 6.4 6.0 - 8.5 g/dL   Albumin 4.1 3.8 - 4.9 g/dL   Globulin, Total 2.3 1.5 - 4.5 g/dL   Bilirubin Total 0.2 0.0 - 1.2 mg/dL   Alkaline Phosphatase 59 44 - 121 IU/L   AST 20 0 - 40 IU/L   ALT 19 0 - 32 IU/L  Lipid panel   Collection Time: 09/13/23 10:39 AM  Result Value Ref Range   Cholesterol, Total 211 (H) 100 - 199 mg/dL   Triglycerides 833 (H) 0 - 149 mg/dL   HDL 50 >60 mg/dL   VLDL Cholesterol Cal 30 5 - 40 mg/dL   LDL Chol Calc (NIH) 868 (H) 0 -  99 mg/dL   Chol/HDL Ratio 4.2 0.0 - 4.4 ratio  Hemoglobin A1c   Collection Time: 09/13/23 10:39 AM  Result Value Ref Range   Hgb A1c MFr Bld 6.9 (H) 4.8 - 5.6 %   Est. average glucose Bld gHb Est-mCnc 151 mg/dL      Assessment & Plan:   Problem List Items Addressed This Visit       Cardiovascular and Mediastinum   Hypertension associated with diabetes (HCC)   Chronic.  Controlled. Continue with amlodipine , HCTZ, metoprolol  and Valsartan  40mg  daily.  Refills sent sent yesterday to mail order. Labs ordered today.  Follow up in 6 months for reevaluation.  Call sooner if  concerns arise.         Endocrine   Type 2 diabetes mellitus with other specified complication (HCC)   Chronic, stable. Labs ordered today. Continue current regimen of Glipizide  5mg  BID.  A1c controlled at 6.9%.  Patient would rather work on diet than add medication.  Continue with diet changes.  Eye exam and foot exam up to date.  Microalbumin checked today.  Follow up in 6 months.  Call sooner if concerns arise.      Relevant Orders   Hemoglobin A1c   Microalbumin, Urine Waived   Hyperlipidemia associated with type 2 diabetes mellitus (HCC)   Chronic, ongoing Has statin intolerance due to myalgias.  Continue Fenofibrate  and fish oil for now Recheck lipid panel in about 6 months to assess changes      Relevant Orders   Lipid panel   Diabetes mellitus treated with oral medication (HCC)     Genitourinary   Stage 3a chronic kidney disease (HCC) (Chronic)   Chronic.  Controlled.  Continue with current medication regimen.  Keep A1c and blood pressure well controlled.  Microalbumin checked today.  Labs ordered today.  Return to clinic in 6 months for reevaluation.  Call sooner if concerns arise.         Other   Anxiety and depression (Chronic)   Chronic.  Controlled.  Continue with current medication regimen of Lexapro  10mg  daily.  Labs ordered today.  Return to clinic in 6 months for reevaluation.  Call sooner if concerns arise.        Myalgia due to statin   Intolerant to statins.        Obesity, morbid (HCC)   Recommended eating smaller high protein, low fat meals more frequently and exercising 30 mins a day 5 times a week with a goal of 10-15lb weight loss in the next 3 months.       Other Visit Diagnoses       Annual physical exam    -  Primary   Health maintenance reviewed during visit today.  Labs ordered.  Vaccines reviewed. Mammogram and Colonoscopy up to date.   Relevant Orders   CBC with Differential/Platelet   Comprehensive metabolic panel with GFR   Lipid  panel   TSH   Hemoglobin A1c   Microalbumin, Urine Waived     Abrasion       Relevant Orders   Td : Tetanus/diphtheria >7yo Preservative  free (Completed)        Follow up plan: No follow-ups on file.   LABORATORY TESTING:  - Pap smear: not applicable  IMMUNIZATIONS:   - Tdap: Tetanus vaccination status reviewed: last tetanus booster within 10 years. - Influenza: Up to date - Pneumovax: Up to date - Prevnar: Up to date - COVID: Up to date - HPV:  Not applicable - Shingrix vaccine: Up to date  SCREENING: -Mammogram: Up to date  - Colonoscopy: Up to date  - Bone Density: Not applicable  -Hearing Test: Not applicable  -Spirometry: Not applicable   PATIENT COUNSELING:   Advised to take 1 mg of folate supplement per day if capable of pregnancy.   Sexuality: Discussed sexually transmitted diseases, partner selection, use of condoms, avoidance of unintended pregnancy  and contraceptive alternatives.   Advised to avoid cigarette smoking.  I discussed with the patient that most people either abstain from alcohol or drink within safe limits (<=14/week and <=4 drinks/occasion for males, <=7/weeks and <= 3 drinks/occasion for females) and that the risk for alcohol disorders and other health effects rises proportionally with the number of drinks per week and how often a drinker exceeds daily limits.  Discussed cessation/primary prevention of drug use and availability of treatment for abuse.   Diet: Encouraged to adjust caloric intake to maintain  or achieve ideal body weight, to reduce intake of dietary saturated fat and total fat, to limit sodium intake by avoiding high sodium foods and not adding table salt, and to maintain adequate dietary potassium and calcium  preferably from fresh fruits, vegetables, and low-fat dairy products.    stressed the importance of regular exercise  Injury prevention: Discussed safety belts, safety helmets, smoke detector, smoking near bedding or  upholstery.   Dental health: Discussed importance of regular tooth brushing, flossing, and dental visits.    NEXT PREVENTATIVE PHYSICAL DUE IN 1 YEAR. No follow-ups on file.

## 2024-03-20 NOTE — Assessment & Plan Note (Signed)
 Chronic.  Controlled.  Continue with current medication regimen.  Keep A1c and blood pressure well controlled.  Microalbumin checked today.  Labs ordered today.  Return to clinic in 6 months for reevaluation.  Call sooner if concerns arise.

## 2024-03-20 NOTE — Assessment & Plan Note (Signed)
 Chronic.  Controlled. Continue with amlodipine , HCTZ, metoprolol  and Valsartan  40mg  daily.  Refills sent sent yesterday to mail order. Labs ordered today.  Follow up in 6 months for reevaluation.  Call sooner if concerns arise.

## 2024-03-20 NOTE — Assessment & Plan Note (Signed)
 Recommended eating smaller high protein, low fat meals more frequently and exercising 30 mins a day 5 times a week with a goal of 10-15lb weight loss in the next 3 months.

## 2024-03-20 NOTE — Assessment & Plan Note (Signed)
Chronic.  Controlled.  Continue with current medication regimen of Lexapro 10mg  daily.  Labs ordered today.  Return to clinic in 6 months for reevaluation.  Call sooner if concerns arise.  ? ?

## 2024-03-20 NOTE — Assessment & Plan Note (Signed)
 Chronic, stable. Labs ordered today. Continue current regimen of Glipizide  5mg  BID.  A1c controlled at 6.9%.  Patient would rather work on diet than add medication.  Continue with diet changes.  Eye exam and foot exam up to date.  Microalbumin checked today.  Follow up in 6 months.  Call sooner if concerns arise.

## 2024-03-20 NOTE — Assessment & Plan Note (Signed)
 Chronic, ongoing Has statin intolerance due to myalgias.  Continue Fenofibrate  and fish oil for now Recheck lipid panel in about 6 months to assess changes

## 2024-03-20 NOTE — Assessment & Plan Note (Signed)
 Intolerant to statins.

## 2024-03-21 ENCOUNTER — Ambulatory Visit: Payer: Self-pay | Admitting: Nurse Practitioner

## 2024-03-21 LAB — COMPREHENSIVE METABOLIC PANEL WITH GFR
ALT: 21 IU/L (ref 0–32)
AST: 21 IU/L (ref 0–40)
Albumin: 4.4 g/dL (ref 3.8–4.9)
Alkaline Phosphatase: 63 IU/L (ref 49–135)
BUN/Creatinine Ratio: 11 — ABNORMAL LOW (ref 12–28)
BUN: 10 mg/dL (ref 8–27)
Bilirubin Total: 0.2 mg/dL (ref 0.0–1.2)
CO2: 24 mmol/L (ref 20–29)
Calcium: 9.7 mg/dL (ref 8.7–10.3)
Chloride: 101 mmol/L (ref 96–106)
Creatinine, Ser: 0.88 mg/dL (ref 0.57–1.00)
Globulin, Total: 2 g/dL (ref 1.5–4.5)
Glucose: 189 mg/dL — ABNORMAL HIGH (ref 70–99)
Potassium: 4.2 mmol/L (ref 3.5–5.2)
Sodium: 139 mmol/L (ref 134–144)
Total Protein: 6.4 g/dL (ref 6.0–8.5)
eGFR: 75 mL/min/1.73 (ref >59)

## 2024-03-21 LAB — CBC WITH DIFFERENTIAL/PLATELET
Basophils Absolute: 0.1 x10E3/uL (ref 0.0–0.2)
Basos: 1 %
EOS (ABSOLUTE): 0.2 x10E3/uL (ref 0.0–0.4)
Eos: 2 %
Hematocrit: 42.1 % (ref 34.0–46.6)
Hemoglobin: 13.6 g/dL (ref 11.1–15.9)
Immature Grans (Abs): 0 x10E3/uL (ref 0.0–0.1)
Immature Granulocytes: 0 %
Lymphocytes Absolute: 3.6 x10E3/uL — ABNORMAL HIGH (ref 0.7–3.1)
Lymphs: 40 %
MCH: 31.6 pg (ref 26.6–33.0)
MCHC: 32.3 g/dL (ref 31.5–35.7)
MCV: 98 fL — ABNORMAL HIGH (ref 79–97)
Monocytes Absolute: 0.5 x10E3/uL (ref 0.1–0.9)
Monocytes: 6 %
Neutrophils Absolute: 4.7 x10E3/uL (ref 1.4–7.0)
Neutrophils: 51 %
Platelets: 421 x10E3/uL (ref 150–450)
RBC: 4.3 x10E6/uL (ref 3.77–5.28)
RDW: 12.8 % (ref 11.7–15.4)
WBC: 9.1 x10E3/uL (ref 3.4–10.8)

## 2024-03-21 LAB — LIPID PANEL
Chol/HDL Ratio: 3.8 ratio (ref 0.0–4.4)
Cholesterol, Total: 192 mg/dL (ref 100–199)
HDL: 51 mg/dL (ref >39)
LDL Chol Calc (NIH): 111 mg/dL — ABNORMAL HIGH (ref 0–99)
Triglycerides: 171 mg/dL — ABNORMAL HIGH (ref 0–149)
VLDL Cholesterol Cal: 30 mg/dL (ref 5–40)

## 2024-03-21 LAB — TSH: TSH: 3.1 u[IU]/mL (ref 0.450–4.500)

## 2024-03-21 LAB — HEMOGLOBIN A1C
Est. average glucose Bld gHb Est-mCnc: 206 mg/dL
Hgb A1c MFr Bld: 8.8 % — ABNORMAL HIGH (ref 4.8–5.6)

## 2024-03-31 ENCOUNTER — Other Ambulatory Visit: Payer: Self-pay | Admitting: Nurse Practitioner

## 2024-04-01 NOTE — Telephone Encounter (Signed)
 Requested Prescriptions  Refused Prescriptions Disp Refills   potassium chloride  SA (KLOR-CON  M) 20 MEQ tablet [Pharmacy Med Name: POTASSIUM CL ER 20 MEQ TAB MCR] 180 tablet 1    Sig: TAKE 1 TABLET BY MOUTH TWICE A DAY     Endocrinology:  Minerals - Potassium Supplementation Passed - 04/01/2024  5:05 PM      Passed - K in normal range and within 360 days    Potassium  Date Value Ref Range Status  03/20/2024 4.2 3.5 - 5.2 mmol/L Final  01/22/2013 3.1 (L) 3.5 - 5.1 mmol/L Final         Passed - Cr in normal range and within 360 days    Creatinine  Date Value Ref Range Status  01/22/2013 0.86 0.60 - 1.30 mg/dL Final   Creatinine, Ser  Date Value Ref Range Status  03/20/2024 0.88 0.57 - 1.00 mg/dL Final         Passed - Valid encounter within last 12 months    Recent Outpatient Visits           1 week ago Annual physical exam   [place] [facility] Melvin Pao, NP   6 months ago Hypertension associated with diabetes [facility])   [place] [facility] Melvin Pao, NP   9 months ago Anxiety and depression   [place] [facility] Melvin Pao, NP

## 2024-04-05 ENCOUNTER — Other Ambulatory Visit: Payer: Self-pay | Admitting: Nurse Practitioner

## 2024-04-07 ENCOUNTER — Other Ambulatory Visit: Payer: Self-pay | Admitting: Nurse Practitioner

## 2024-04-09 NOTE — Telephone Encounter (Signed)
 Requested Prescriptions  Pending Prescriptions Disp Refills   escitalopram  (LEXAPRO ) 10 MG tablet [Pharmacy Med Name: ESCITALOPRAM  10 MG TABLET] 90 tablet 1    Sig: TAKE 1 TABLET BY MOUTH EVERY DAY     Psychiatry:  Antidepressants - SSRI Passed - 04/09/2024  1:50 PM      Passed - Completed PHQ-2 or PHQ-9 in the last 360 days      Passed - Valid encounter within last 6 months    Recent Outpatient Visits           2 weeks ago Annual physical exam   [place] [facility] Melvin Pao, NP   6 months ago Hypertension associated with diabetes [facility])   [place] [facility] Melvin Pao, NP   10 months ago Anxiety and depression   [place] [facility] [place], Pao, NP               gabapentin  (NEURONTIN ) 300 MG capsule [Pharmacy Med Name: GABAPENTIN  300 MG CAPSULE] 270 capsule 1    Sig: TAKE 1 CAPSULE BY MOUTH THREE TIMES A DAY     Neurology: Anticonvulsants - gabapentin  Passed - 04/09/2024  8:49 PM      Passed - Cr in normal range and within 360 days    Creatinine  Date Value Ref Range Status  01/22/2013 0.86 0.60 - 1.30 mg/dL Final   Creatinine, Ser  Date Value Ref Range Status  03/20/2024 0.88 0.57 - 1.00 mg/dL Final         Passed - Completed PHQ-2 or PHQ-9 in the last 360 days      Passed - Valid encounter within last 12 months    Recent Outpatient Visits           2 weeks ago Annual physical exam   [place] [facility] Melvin Pao, NP   6 months ago Hypertension associated with diabetes [facility])   [place] [facility] Melvin Pao, NP   10 months ago Anxiety and depression   [place] [facility] Melvin Pao, NP

## 2024-04-10 NOTE — Telephone Encounter (Signed)
 Too soon for refill.  Requested Prescriptions  Pending Prescriptions Disp Refills   amLODipine  (NORVASC ) 10 MG tablet [Pharmacy Med Name: AMLODIPINE  BESYLATE 10 MG TAB] 90 tablet 1    Sig: TAKE 1 TABLET BY MOUTH EVERY DAY     Cardiovascular: Calcium  Channel Blockers 2 Passed - 04/10/2024  2:55 PM      Passed - Last BP in normal range    BP Readings from Last 1 Encounters:  03/20/24 124/66         Passed - Last Heart Rate in normal range    Pulse Readings from Last 1 Encounters:  03/20/24 (!) 59         Passed - Valid encounter within last 6 months    Recent Outpatient Visits           3 weeks ago Annual physical exam   [place] [facility] Melvin Pao, NP   7 months ago Hypertension associated with diabetes [facility])   [place] [facility] Melvin Pao, NP   10 months ago Anxiety and depression   [facility] Melvin Pao, NP

## 2024-04-25 ENCOUNTER — Encounter: Payer: Self-pay | Admitting: Nurse Practitioner

## 2024-04-25 ENCOUNTER — Ambulatory Visit (INDEPENDENT_AMBULATORY_CARE_PROVIDER_SITE_OTHER): Admitting: Nurse Practitioner

## 2024-04-25 VITALS — BP 114/76 | HR 59 | Temp 98.1°F | Ht 65.59 in | Wt 229.0 lb

## 2024-04-25 DIAGNOSIS — E1169 Type 2 diabetes mellitus with other specified complication: Secondary | ICD-10-CM

## 2024-04-25 DIAGNOSIS — L409 Psoriasis, unspecified: Secondary | ICD-10-CM | POA: Diagnosis not present

## 2024-04-25 DIAGNOSIS — Z7984 Long term (current) use of oral hypoglycemic drugs: Secondary | ICD-10-CM

## 2024-04-25 DIAGNOSIS — E119 Type 2 diabetes mellitus without complications: Secondary | ICD-10-CM | POA: Diagnosis not present

## 2024-04-25 MED ORDER — EMPAGLIFLOZIN 25 MG PO TABS: 25.0000 mg | ORAL_TABLET | Freq: Every day | ORAL | 1 refills | Status: DC

## 2024-04-25 MED ORDER — GLIPIZIDE 5 MG PO TABS: ORAL_TABLET | ORAL | 1 refills | Status: AC

## 2024-04-25 NOTE — Assessment & Plan Note (Deleted)
 Chronic. Not well controlled.  A1c in November was 8.8%.  Had pancreatitis with Ozempic  and diarrhea with Metformin.  Will add Jardiance  25mg  daily.  Side effects and benefits of medication discussed.  Follow up in 3 months.  Call sooner if concerns arise.

## 2024-04-25 NOTE — Progress Notes (Addendum)
 BP 114/76 (BP Location: Left Arm, Patient Position: Sitting, Cuff Size: Large)   Pulse (!) 59   Temp 98.1 F (36.7 C) (Oral)   Ht 5' 5.59 (1.666 m)   Wt 229 lb (103.9 kg)   LMP  (LMP Unknown)   SpO2 96%   BMI 37.42 kg/m    Subjective:    Patient ID: Hannah Calhoun, female    DOB: 02-Jan-1961, 63 y.o.   MRN: [id number]  HPI: Hannah Calhoun is a 63 y.o. female  Chief Complaint  Patient presents with   office visit    Lab follow. Patient stated she wants to speak about psoriasis    DIABETES Taking Glipizide  5mg  BID. Has taken Ozempic  before and caused her Pancreatitis.  Metformin caused her a lot of diarrhea. Hypoglycemic episodes:no Polydipsia/polyuria: no Visual disturbance: no Chest pain: no Paresthesias: yes Glucose Monitoring: yes  Accucheck frequency: not checking  Fasting glucose:   Post prandial:  Evening:  Before meals: Taking Insulin?: no  Long acting insulin:  Short acting insulin: Blood Pressure Monitoring: rarely Retinal Examination: Up to Date- requested Foot Exam: Up to Date Diabetic Education: Not Completed Pneumovax: Up to Date Influenza: Up to Date Aspirin: no  Patient states she has been seeing dermatology for her Psoriasis.  Patient is having joint pain.  Sometimes she isn't able to squeeze out a rag.  Her feet hurt a lot.    Relevant past medical, surgical, family and social history reviewed and updated as indicated. Interim medical history since our last visit reviewed. Allergies and medications reviewed and updated.  Review of Systems  Eyes:  Negative for visual disturbance.  Cardiovascular:  Negative for chest pain.  Endocrine: Negative for polydipsia and polyuria.  Musculoskeletal:  Positive for arthralgias.  Neurological:  Negative for numbness.    Per HPI unless specifically indicated above     Objective:    BP 114/76 (BP Location: Left Arm, Patient Position: Sitting, Cuff Size: Large)   Pulse (!) 59   Temp  98.1 F (36.7 C) (Oral)   Ht 5' 5.59 (1.666 m)   Wt 229 lb (103.9 kg)   LMP  (LMP Unknown)   SpO2 96%   BMI 37.42 kg/m   Wt Readings from Last 3 Encounters:  04/25/24 229 lb (103.9 kg)  03/20/24 230 lb (104.3 kg)  09/13/23 217 lb (98.4 kg)    Physical Exam Vitals and nursing note reviewed.  Constitutional:      General: She is not in acute distress.    Appearance: Normal appearance. She is normal weight. She is not ill-appearing, toxic-appearing or diaphoretic.  HENT:     Head: Normocephalic.     Right Ear: External ear normal.     Left Ear: External ear normal.     Nose: Nose normal.     Mouth/Throat:     Mouth: Mucous membranes are moist.     Pharynx: Oropharynx is clear.  Eyes:     General:        Right eye: No discharge.        Left eye: No discharge.     Extraocular Movements: Extraocular movements intact.     Conjunctiva/sclera: Conjunctivae normal.     Pupils: Pupils are equal, round, and reactive to light.  Cardiovascular:     Rate and Rhythm: Normal rate and regular rhythm.     Heart sounds: No murmur heard. Pulmonary:     Effort: Pulmonary effort is normal. No respiratory distress.  Breath sounds: Normal breath sounds. No wheezing or rales.  Musculoskeletal:     Cervical back: Normal range of motion and neck supple.  Skin:    General: Skin is warm and dry.     Capillary Refill: Capillary refill takes less than 2 seconds.  Neurological:     General: No focal deficit present.     Mental Status: She is alert and oriented to person, place, and time. Mental status is at baseline.  Psychiatric:        Mood and Affect: Mood normal.        Behavior: Behavior normal.        Thought Content: Thought content normal.        Judgment: Judgment normal.     Results for orders placed or performed in visit on 03/20/24  Microalbumin, Urine Waived   Collection Time: 03/20/24  8:39 AM  Result Value Ref Range   Microalb, Ur Waived 10 0 - 19 mg/L   Creatinine,  Urine Waived 100 10 - 300 mg/dL   Microalb/Creat Ratio <30 <30 mg/g  CBC with Differential/Platelet   Collection Time: 03/20/24  8:40 AM  Result Value Ref Range   WBC 9.1 3.4 - 10.8 x10E3/uL   RBC 4.30 3.77 - 5.28 x10E6/uL   Hemoglobin 13.6 11.1 - 15.9 g/dL   Hematocrit 57.8 65.9 - 46.6 %   MCV 98 (H) 79 - 97 fL   MCH 31.6 26.6 - 33.0 pg   MCHC 32.3 31.5 - 35.7 g/dL   RDW 87.1 88.2 - 84.5 %   Platelets 421 150 - 450 x10E3/uL   Neutrophils 51 Not Estab. %   Lymphs 40 Not Estab. %   Monocytes 6 Not Estab. %   Eos 2 Not Estab. %   Basos 1 Not Estab. %   Neutrophils Absolute 4.7 1.4 - 7.0 x10E3/uL   Lymphocytes Absolute 3.6 (H) 0.7 - 3.1 x10E3/uL   Monocytes Absolute 0.5 0.1 - 0.9 x10E3/uL   EOS (ABSOLUTE) 0.2 0.0 - 0.4 x10E3/uL   Basophils Absolute 0.1 0.0 - 0.2 x10E3/uL   Immature Granulocytes 0 Not Estab. %   Immature Grans (Abs) 0.0 0.0 - 0.1 x10E3/uL  Comprehensive metabolic panel with GFR   Collection Time: 03/20/24  8:40 AM  Result Value Ref Range   Glucose 189 (H) 70 - 99 mg/dL   BUN 10 8 - 27 mg/dL   Creatinine, Ser 9.11 0.57 - 1.00 mg/dL   eGFR 75 >40 fO/fpw/8.26   BUN/Creatinine Ratio 11 (L) 12 - 28   Sodium 139 134 - 144 mmol/L   Potassium 4.2 3.5 - 5.2 mmol/L   Chloride 101 96 - 106 mmol/L   CO2 24 20 - 29 mmol/L   Calcium  9.7 8.7 - 10.3 mg/dL   Total Protein 6.4 6.0 - 8.5 g/dL   Albumin 4.4 3.8 - 4.9 g/dL   Globulin, Total 2.0 1.5 - 4.5 g/dL   Bilirubin Total 0.2 0.0 - 1.2 mg/dL   Alkaline Phosphatase 63 49 - 135 IU/L   AST 21 0 - 40 IU/L   ALT 21 0 - 32 IU/L  Lipid panel   Collection Time: 03/20/24  8:40 AM  Result Value Ref Range   Cholesterol, Total 192 100 - 199 mg/dL   Triglycerides 828 (H) 0 - 149 mg/dL   HDL 51 >60 mg/dL   VLDL Cholesterol Cal 30 5 - 40 mg/dL   LDL Chol Calc (NIH) 888 (H) 0 - 99 mg/dL   Chol/HDL Ratio  3.8 0.0 - 4.4 ratio  TSH   Collection Time: 03/20/24  8:40 AM  Result Value Ref Range   TSH 3.100 0.450 - 4.500 uIU/mL   Hemoglobin A1c   Collection Time: 03/20/24  8:40 AM  Result Value Ref Range   Hgb A1c MFr Bld 8.8 (H) 4.8 - 5.6 %   Est. average glucose Bld gHb Est-mCnc 206 mg/dL      Assessment & Plan:   Problem List Items Addressed This Visit       Endocrine   Diabetes mellitus treated with oral medication (HCC)   Chronic. Not well controlled.  A1c in November was 8.8%.  Had pancreatitis with Ozempic  and diarrhea with Metformin.  Will add Jardiance  25mg  daily.  Side effects and benefits of medication discussed.  Follow up in 3 months.  Call sooner if concerns arise.       Relevant Medications   glipiZIDE  (GLUCOTROL ) 5 MG tablet   empagliflozin  (JARDIANCE ) 25 MG TABS tablet   RESOLVED: Type 2 diabetes mellitus with other specified complication (HCC) - Primary   Relevant Medications   glipiZIDE  (GLUCOTROL ) 5 MG tablet   empagliflozin  (JARDIANCE ) 25 MG TABS tablet   Other Visit Diagnoses       Psoriasis       Will rule out autoiummune cellulitis. Labs ordered at visit today.  Follow up if not improved.   Relevant Orders   Rheumatoid factor   ANA   ANA+ENA+DNA/DS+Antich+Centr   CK   Uric acid   Ehrlichia antibody panel   Comprehensive metabolic panel with GFR   CBC with Differential/Platelet        Follow up plan: Return in about 3 months (around 07/24/2024) for HTN, HLD, DM2 FU.

## 2024-04-25 NOTE — Assessment & Plan Note (Signed)
 Chronic. Not well controlled.  A1c in November was 8.8%.  Had pancreatitis with Ozempic  and diarrhea with Metformin.  Will add Jardiance  25mg  daily.  Side effects and benefits of medication discussed.  Follow up in 3 months.  Call sooner if concerns arise.

## 2024-04-27 ENCOUNTER — Encounter: Payer: Self-pay | Admitting: Nurse Practitioner

## 2024-04-27 DIAGNOSIS — E119 Type 2 diabetes mellitus without complications: Secondary | ICD-10-CM

## 2024-04-27 LAB — COMPREHENSIVE METABOLIC PANEL WITH GFR
ALT: 23 IU/L (ref 0–32)
AST: 21 IU/L (ref 0–40)
Albumin: 4.1 g/dL (ref 3.9–4.9)
Alkaline Phosphatase: 57 IU/L (ref 49–135)
BUN/Creatinine Ratio: 10 — ABNORMAL LOW (ref 12–28)
BUN: 8 mg/dL (ref 8–27)
Bilirubin Total: 0.2 mg/dL (ref 0.0–1.2)
CO2: 25 mmol/L (ref 20–29)
Calcium: 9.5 mg/dL (ref 8.7–10.3)
Chloride: 103 mmol/L (ref 96–106)
Creatinine, Ser: 0.8 mg/dL (ref 0.57–1.00)
Globulin, Total: 2.3 g/dL (ref 1.5–4.5)
Glucose: 156 mg/dL — ABNORMAL HIGH (ref 70–99)
Potassium: 4.2 mmol/L (ref 3.5–5.2)
Sodium: 142 mmol/L (ref 134–144)
Total Protein: 6.4 g/dL (ref 6.0–8.5)
eGFR: 84 mL/min/1.73

## 2024-04-27 LAB — ANA+ENA+DNA/DS+ANTICH+CENTR
ANA Titer 1: NEGATIVE
Anti JO-1: <0.2 (ref 0.0–0.9)
Centromere Ab Screen: <0.2 (ref 0.0–0.9)
Chromatin Ab SerPl-aCnc: <0.2 (ref 0.0–0.9)
ENA RNP Ab: <0.2 (ref 0.0–0.9)
ENA SM Ab Ser-aCnc: <0.2 (ref 0.0–0.9)
ENA SSA (RO) Ab: 0.9 (ref 0.0–0.9)
ENA SSB (LA) Ab: <0.2 (ref 0.0–0.9)
Scleroderma (Scl-70) (ENA) Antibody, IgG: <0.2 (ref 0.0–0.9)
dsDNA Ab: 3 [IU]/mL (ref 0–9)

## 2024-04-27 LAB — CBC WITH DIFFERENTIAL/PLATELET
Basophils Absolute: 0 x10E3/uL (ref 0.0–0.2)
Basos: 1 %
EOS (ABSOLUTE): 0 x10E3/uL (ref 0.0–0.4)
Eos: 1 %
Hematocrit: 39.7 % (ref 34.0–46.6)
Hemoglobin: 13.2 g/dL (ref 11.1–15.9)
Immature Grans (Abs): 0 x10E3/uL (ref 0.0–0.1)
Immature Granulocytes: 0 %
Lymphocytes Absolute: 3.1 x10E3/uL (ref 0.7–3.1)
Lymphs: 42 %
MCH: 32.3 pg (ref 26.6–33.0)
MCHC: 33.2 g/dL (ref 31.5–35.7)
MCV: 97 fL (ref 79–97)
Monocytes Absolute: 0.5 x10E3/uL (ref 0.1–0.9)
Monocytes: 7 %
Neutrophils Absolute: 3.7 x10E3/uL (ref 1.4–7.0)
Neutrophils: 49 %
Platelets: 405 x10E3/uL (ref 150–450)
RBC: 4.09 x10E6/uL (ref 3.77–5.28)
RDW: 12.8 % (ref 11.7–15.4)
WBC: 7.4 x10E3/uL (ref 3.4–10.8)

## 2024-04-27 LAB — EHRLICHIA ANTIBODY PANEL
E. Chaffeensis (HME) IgM Titer: NEGATIVE
E.Chaffeensis (HME) IgG: NEGATIVE
HGE IgG Titer: NEGATIVE
HGE IgM Titer: NEGATIVE

## 2024-04-27 LAB — RHEUMATOID FACTOR: Rheumatoid fact SerPl-aCnc: <10 [IU]/mL

## 2024-04-27 LAB — URIC ACID: Uric Acid: 4.4 mg/dL (ref 3.0–7.2)

## 2024-04-27 LAB — ANA

## 2024-04-27 LAB — CK: Total CK: 104 U/L (ref 32–182)

## 2024-04-29 ENCOUNTER — Ambulatory Visit: Payer: Self-pay | Admitting: Nurse Practitioner

## 2024-04-29 ENCOUNTER — Telehealth: Payer: Self-pay | Admitting: Nurse Practitioner

## 2024-04-29 NOTE — Telephone Encounter (Signed)
 Is there anything different we can send in? Or can the pharmacy team get involved?

## 2024-04-29 NOTE — Telephone Encounter (Signed)
Please see MyChart Message.

## 2024-04-29 NOTE — Telephone Encounter (Signed)
 Copied from CRM #[id number]. Topic: Clinical - Prescription Issue >> Apr 29, 2024  8:46 AM Charlet HERO wrote: Reason for CRM: Patient is calling about the script for jaurdaince being 200 dollars and she can not afford the price, would like to have something else called in. Please contact the patient for more asst.

## 2024-04-29 NOTE — Telephone Encounter (Signed)
 Called patient and left a Vm in regards to checking her Mychart message about her medication. If she has any question she can call us  back.  Also, it is okay for E2C2 to speak with patient as well.

## 2024-04-30 ENCOUNTER — Telehealth: Payer: Self-pay

## 2024-04-30 NOTE — Progress Notes (Signed)
 Care Guide Pharmacy Note  04/30/2024 Name: [name] MRN: [id number] DOB: [birth year]  Referred By: Melvin Pao, NP Reason for referral: Complex Care Management (Outreach to schedule with Pharm d )   Hannah Calhoun is a 63 y.o. year old female who is a primary care patient of Melvin Pao, NP.  Hannah Calhoun was referred to the pharmacist for assistance related to: DMII  Successful contact was made with the patient to discuss pharmacy services including being ready for the pharmacist to call at least 5 minutes before the scheduled appointment time and to have medication bottles and any blood pressure readings ready for review. The patient agreed to meet with the pharmacist via telephone visit on (date/time).05/08/2024  Jeoffrey Buffalo , RMA     [place]  [facility], [facility] Guide  Direct Dial: [phone]  Website: [email or web address]

## 2024-05-08 ENCOUNTER — Telehealth: Payer: Self-pay

## 2024-05-08 ENCOUNTER — Other Ambulatory Visit: Payer: Self-pay

## 2024-05-08 DIAGNOSIS — E1169 Type 2 diabetes mellitus with other specified complication: Secondary | ICD-10-CM

## 2024-05-08 DIAGNOSIS — E119 Type 2 diabetes mellitus without complications: Secondary | ICD-10-CM

## 2024-05-08 DIAGNOSIS — I152 Hypertension secondary to endocrine disorders: Secondary | ICD-10-CM

## 2024-05-08 DIAGNOSIS — E1159 Type 2 diabetes mellitus with other circulatory complications: Secondary | ICD-10-CM

## 2024-05-08 NOTE — Telephone Encounter (Signed)
 Filled and faxed provider portion [place] (Jardiance ) to provider office ,mail out pt portion.

## 2024-05-08 NOTE — Progress Notes (Signed)
 "  05/08/2024 Name: Hannah Calhoun MRN: [id number] DOB: Jun 01, 1960  Chief Complaint  Patient presents with   Medication Assistance   Hannah Calhoun is a 64 y.o. year old female who presented for a telephone visit.   They were referred to the pharmacist by their PCP for assistance in managing medication access.   Subjective:  Care Team: Primary Care Provider: Melvin Pao, NP ; Next Scheduled Visit: 07/24/24  Medication Access/Adherence  Current Pharmacy:  [facility] [address] [place] - [address] [place] [address] Phone: [phone] Fax: [phone]  [facility] - [place] - [address] [place] [address] Phone: [phone] Fax: [phone]  [facility] [address] [place] [place] [facility] [address] [place] [address] Phone: [phone] Fax: [phone]  Patient reports affordability concerns with their medications: Yes  Patient reports access/transportation concerns to their pharmacy: No  Patient reports adherence concerns with their medications:  Yes    Diabetes: Current medications: glipizide  5mg  BID -Medications tried in the past: metformin caused diarrhea and patient had pancreatitis while using Ozempic  -A1c recently found to be elevated at 8.8%, up from 6.9% in May -Jardiance  25mg  was prescribed, but patient has not been able to pick up and initiate therapy due to copay of >$200 -Current glucose readings: patient does not monitor every day, but FBG has been averaging 185-210 recently -Using [employer] brand testing supplies purchased OTC at [facility]  Macrovascular and Microvascular Risk Reduction:  Statin? no; patient previously intolerant to statin therapy; ACEi/ARB? yes (valsartan  40mg  daily) Last urinary albumin/creatinine ratio:  Lab Results  Component Value Date   MICRALBCREAT <30 03/20/2024   MICRALBCREAT <30 09/13/2023    MICRALBCREAT <30 10/04/2022   MICRALBCREAT 30-300 (H) 07/05/2021   MICRALBCREAT <30 07/03/2020   Last eye exam:  Lab Results  Component Value Date   HMDIABEYEEXA  12/01/2022     Comment:     Unable to determine - Abstracted by HIM   Last foot exam: 03/20/2024 Tobacco Use:  Tobacco Use: Medium Risk (04/25/2024)   Patient History    Smoking Tobacco Use: Former    Smokeless Tobacco Use: Never    Passive Exposure: Not on file   Objective:  Lab Results  Component Value Date   HGBA1C 8.8 (H) 03/20/2024   Lab Results  Component Value Date   CREATININE 0.80 04/25/2024   BUN 8 04/25/2024   NA 142 04/25/2024   K 4.2 04/25/2024   CL 103 04/25/2024   CO2 25 04/25/2024   Lab Results  Component Value Date   CHOL 192 03/20/2024   HDL 51 03/20/2024   LDLCALC 111 (H) 03/20/2024   TRIG 171 (H) 03/20/2024   CHOLHDL 3.8 03/20/2024   Medications Reviewed Today     Reviewed by Deanna Channing LABOR, RPH (Pharmacist) on 05/08/24 at 0911  Med List Status: <None>   Medication Order Taking? Sig Documenting Provider Last Dose Status Informant  acetaminophen  (TYLENOL ) 500 MG tablet [id number]  Take 650 mg by mouth 2 (two) times daily. [provider]  Active   amLODipine  (NORVASC ) 10 MG tablet [id number] Yes Take 1 tablet (10 mg total) by mouth daily. Melvin Pao, NP  Active   Cholecalciferol (VITAMIN D3) 50 MCG (2000 UT) TABS [id number]  Take 1 tablet by mouth daily. [provider]  Active   clobetasol cream (TEMOVATE) 0.05 % [id number]  Apply topically. [provider]  Active   escitalopram  (LEXAPRO ) 10 MG tablet [id number] Yes TAKE 1 TABLET BY MOUTH EVERY DAY Melvin Pao, NP  Active   fenofibrate  54 MG tablet [id number] Yes Take 1 tablet (54 mg total) by mouth daily. Melvin Pao, NP  Active   gabapentin  (NEURONTIN ) 300 MG capsule [id number] Yes TAKE 1 CAPSULE BY MOUTH THREE TIMES A Hannah Calhoun Melvin Pao, NP  Active   glipiZIDE  (GLUCOTROL ) 5 MG tablet  [id number] Yes TAKE 1 TABLET BY MOUTH TWICE A DAY BEFORE A MEAL-NO MORE REFILLS WITHOUT OFFICE VISIT Melvin Pao, NP  Active   hydrochlorothiazide  (HYDRODIURIL ) 25 MG tablet [id number] Yes Take 1 tablet (25 mg total) by mouth daily. Melvin Pao, NP  Active   metoprolol  succinate (TOPROL -XL) 25 MG 24 hr tablet [id number] Yes Take 1 tablet (25 mg total) by mouth daily. Melvin Pao, NP  Active   Multiple Vitamin (MULTI-VITAMIN) tablet [id number]  Take 1 tablet by mouth daily. [provider]  Active   potassium chloride  SA (KLOR-CON  M20) 20 MEQ tablet [id number] Yes Take 1 tablet (20 mEq total) by mouth 2 (two) times daily. Melvin Pao, NP  Active   valsartan  (DIOVAN ) 40 MG tablet [id number] Yes Take 1 tablet (40 mg total) by mouth daily. Melvin Pao, NP  Active            Assessment/Plan:   Diabetes: -Currently uncontrolled; goal A1c <7%. Cardiorenal risk reduction is opportunities for improvement.. Blood pressure is at goal <130/80. LDL is not at goal.  -Continue glipizide  5mg  BID at this time -Based on [facility], patient should qualify for BI C[employer] PAP for Jardiance  25mg - will coordinate with medication assistance team to initiate application process -I recommend checking FBG daily and recording values- I will ask PA team to run test claims for glucometer, strips and lancets, because patient should be able to fill as prescription with no copay versus purchasing OTC -I only see where patient has tried IR metformin in the past, we could consider metformin XR 500mg  daily in the future if A1c goal not met with initiation of Jardiance  -Patient cannot tolerate statins, but I would recommend consideration of ezetimibe 10mg  daily or Repatha 140mg  every 2 weeks to try to get LDL to goal of <70.  Patient would qualify for [employer] to cover copay for Repatha  Follow Up Plan: Will make sure patient has received BI Cares PAP application in 2 weeks  Channing DELENA Mealing,  PharmD, DPLA    "

## 2024-05-15 ENCOUNTER — Other Ambulatory Visit (HOSPITAL_COMMUNITY): Payer: Self-pay

## 2024-05-19 MED ORDER — ACCU-CHEK GUIDE TEST VI STRP: ORAL_STRIP | 12 refills | Status: AC

## 2024-05-19 MED ORDER — ACCU-CHEK GUIDE ME W/DEVICE KIT: PACK | 0 refills | Status: AC

## 2024-05-19 MED ORDER — ACCU-CHEK SOFTCLIX LANCETS MISC: 12 refills | Status: AC

## 2024-05-27 ENCOUNTER — Other Ambulatory Visit: Payer: Self-pay | Admitting: Nurse Practitioner

## 2024-05-27 DIAGNOSIS — E1169 Type 2 diabetes mellitus with other specified complication: Secondary | ICD-10-CM

## 2024-05-27 DIAGNOSIS — T466X5A Adverse effect of antihyperlipidemic and antiarteriosclerotic drugs, initial encounter: Secondary | ICD-10-CM

## 2024-05-28 NOTE — Telephone Encounter (Signed)
 Too soon for refill, last refill 03/19/24 for 90 and 1 refill.  Requested Prescriptions  Pending Prescriptions Disp Refills   glipiZIDE  (GLUCOTROL ) 5 MG tablet [Pharmacy Med Name: glipiZIDE  5 MG Oral Tablet] 200 tablet 2    Sig: TAKE 1 TABLET BY MOUTH TWICE  DAILY BEFORE MEALS . NO MORE  REFILLS WITHOUT OFFICE VISIT     Endocrinology:  Diabetes - Sulfonylureas Failed - 05/28/2024  8:29 AM      Failed - HBA1C is between 0 and 7.9 and within 180 days    HB A1C (BAYER DCA - WAIVED)  Date Value Ref Range Status  07/03/2020 7.5 (H) <7.0 % Final    Comment:                                          Diabetic Adult            <7.0                                       Healthy Adult        4.3 - 5.7                                                           (DCCT/NGSP) American Diabetes Association's Summary of Glycemic Recommendations for Adults with Diabetes: Hemoglobin A1c <7.0%. More stringent glycemic goals (A1c <6.0%) may further reduce complications at the cost of increased risk of hypoglycemia.    Hgb A1c MFr Bld  Date Value Ref Range Status  03/20/2024 8.8 (H) 4.8 - 5.6 % Final    Comment:             Prediabetes: 5.7 - 6.4          Diabetes: >6.4          Glycemic control for adults with diabetes: <7.0          Passed - Cr in normal range and within 360 days    Creatinine  Date Value Ref Range Status  01/22/2013 0.86 0.60 - 1.30 mg/dL Final   Creatinine, Ser  Date Value Ref Range Status  04/25/2024 0.80 0.57 - 1.00 mg/dL Final         Passed - Valid encounter within last 6 months    Recent Outpatient Visits           1 month ago Type 2 diabetes mellitus with other specified complication, without long-term current use of insulin (HCC)   [facility] Melvin Pao, NP   2 months ago Annual physical exam   [place] [facility] Melvin Pao, NP   8 months ago Hypertension associated with diabetes [facility])   [place]  [facility], [facility]   11 months ago Anxiety and depression   [place] [facility], Pao, NP               valsartan  (DIOVAN ) 40 MG tablet [Pharmacy Med Name: Valsartan  40 MG Oral Tablet] 100 tablet 2    Sig: TAKE 1 TABLET BY MOUTH DAILY     Cardiovascular:  Angiotensin Receptor Blockers Passed - 05/28/2024  8:29 AM      Passed - Cr in normal range and within 180 days    Creatinine  Date Value Ref Range Status  01/22/2013 0.86 0.60 - 1.30 mg/dL Final   Creatinine, Ser  Date Value Ref Range Status  04/25/2024 0.80 0.57 - 1.00 mg/dL Final         Passed - K in normal range and within 180 days    Potassium  Date Value Ref Range Status  04/25/2024 4.2 3.5 - 5.2 mmol/L Final  01/22/2013 3.1 (L) 3.5 - 5.1 mmol/L Final         Passed - Patient is not pregnant      Passed - Last BP in normal range    BP Readings from Last 1 Encounters:  04/25/24 114/76         Passed - Valid encounter within last 6 months    Recent Outpatient Visits           1 month ago Type 2 diabetes mellitus with other specified complication, without long-term current use of insulin (HCC)   [place] [facility] Melvin [facility], [facility]   2 months ago Annual physical exam   [place] [facility] Melvin Pao, NP   8 months ago Hypertension associated with diabetes [facility])   [place] [facility] Melvin Pao, NP   11 months ago Anxiety and depression   [place] [facility] Melvin Pao, NP               gabapentin  (NEURONTIN ) 300 MG capsule [Pharmacy Med Name: GABAPENTIN  CAP 300MG  (NEUR)] 300 capsule 2    Sig: TAKE 1 CAPSULE BY MOUTH 3 TIMES  DAILY     Neurology: Anticonvulsants - gabapentin  Passed - 05/28/2024  8:29 AM      Passed - Cr in normal range and within 360 days    Creatinine  Date Value Ref Range Status  01/22/2013 0.86 0.60 - 1.30 mg/dL Final    Creatinine, Ser  Date Value Ref Range Status  04/25/2024 0.80 0.57 - 1.00 mg/dL Final         Passed - Completed PHQ-2 or PHQ-9 in the last 360 days      Passed - Valid encounter within last 12 months    Recent Outpatient Visits           1 month ago Type 2 diabetes mellitus with other specified complication, without long-term current use of insulin (HCC)   [place] [facility] Mel[facility]   2 months ago Annual physical exam   [place] [facility] Melvin Pao, NP   8 months ago Hypertension associated with diabetes [facility])   [place] [facility] Melvin Pao, NP   11 months ago Anxiety and depression    [facility] Melvin Pao, NP               fenofibrate  54 MG tablet [Pharmacy Med Name: Fenofibrate  54 MG Oral Tablet] 100 tablet 2    Sig: TAKE 1 TABLET BY MOUTH DAILY     Cardiovascular:  Antilipid - Fibric Acid Derivatives Failed - 05/28/2024  8:29 AM      Failed - Lipid Panel in normal range within the last 12 months    Cholesterol, Total  Date Value Ref Range Status  03/20/2024 192 100 - 199 mg/dL Final   LDL Chol Calc (NIH)  Date Value  Ref Range Status  03/20/2024 111 (H) 0 - 99 mg/dL Final   HDL  Date Value Ref Range Status  03/20/2024 51 >39 mg/dL Final   Triglycerides  Date Value Ref Range Status  03/20/2024 171 (H) 0 - 149 mg/dL Final         Passed - ALT in normal range and within 360 days    ALT  Date Value Ref Range Status  04/25/2024 23 0 - 32 IU/L Final   SGPT (ALT)  Date Value Ref Range Status  01/22/2013 28 12 - 78 U/L Final         Passed - AST in normal range and within 360 days    AST  Date Value Ref Range Status  04/25/2024 21 0 - 40 IU/L Final   SGOT(AST)  Date Value Ref Range Status  01/22/2013 33 15 - 37 Unit/L Final         Passed - Cr in normal range and within 360 days    Creatinine  Date Value Ref Range Status   01/22/2013 0.86 0.60 - 1.30 mg/dL Final   Creatinine, Ser  Date Value Ref Range Status  04/25/2024 0.80 0.57 - 1.00 mg/dL Final         Passed - HGB in normal range and within 360 days    Hemoglobin  Date Value Ref Range Status  04/25/2024 13.2 11.1 - 15.9 g/dL Final         Passed - HCT in normal range and within 360 days    Hematocrit  Date Value Ref Range Status  04/25/2024 39.7 34.0 - 46.6 % Final         Passed - PLT in normal range and within 360 days    Platelets  Date Value Ref Range Status  04/25/2024 405 150 - 450 x10E3/uL Final         Passed - WBC in normal range and within 360 days    WBC  Date Value Ref Range Status  04/25/2024 7.4 3.4 - 10.8 x10E3/uL Final  12/10/2021 10.8 (H) 4.0 - 10.5 K/uL Final         Passed - eGFR is 30 or above and within 360 days    EGFR (African American)  Date Value Ref Range Status  01/22/2013 >60  Final   GFR calc Af Amer  Date Value Ref Range Status  07/03/2020 81 >59 mL/min/1.73 Final    Comment:    **In accordance with recommendations from the NKF-ASN Task force,**   [facility] is in the process of updating its eGFR calculation to the   2021 CKD-EPI creatinine equation that estimates kidney function   without a race variable.    EGFR (Non-African Amer.)  Date Value Ref Range Status  01/22/2013 >60  Final    Comment:    eGFR values <35mL/min/1.73 m2 may be an indication of chronic kidney disease (CKD). Calculated eGFR is useful in patients with stable renal function. The eGFR calculation will not be reliable in acutely ill patients when serum creatinine is changing rapidly. It is not useful in  patients on dialysis. The eGFR calculation may not be applicable to patients at the low and high extremes of body sizes, pregnant women, and vegetarians.    GFR, Estimated  Date Value Ref Range Status  12/10/2021 >60 >60 mL/min Final    Comment:    (NOTE) Calculated using the CKD-EPI Creatinine Equation (2021)     GFR  Date Value Ref Range Status  04/28/2015  72.35 >60.00 mL/min Final   eGFR  Date Value Ref Range Status  04/25/2024 84 >59 mL/min/1.73 Final         Passed - Valid encounter within last 12 months    Recent Outpatient Visits           1 month ago Type 2 diabetes mellitus with other specified complication, without long-term current use of insulin (HCC)   [place] [facility] Mel[facility] Pao, NP   2 months ago Annual physical exam   [employer] [facility], NP   8 months ago Hypertension associated with diabetes [facility])   [place] [facility], NP   11 months ago Anxiety and depression   [place] [facility] Melvin Pa[facility], NP               hydrochlorothiazide  (HYDRODIURIL ) 25 MG tablet [Pharmacy Med Name: hydroCHLOROthiazide  25 MG Oral Tablet] 100 tablet 2    Sig: TAKE 1 TABLET BY MOUTH DAILY     Cardiovascular: Diuretics - Thiazide Passed - 05/28/2024  8:29 AM      Passed - Cr in normal range and within 180 days    Creatinine  Date Value Ref Range Status  01/22/2013 0.86 0.60 - 1.30 mg/dL Final   Creatinine, Ser  Date Value Ref Range Status  04/25/2024 0.80 0.57 - 1.00 mg/dL Final         Passed - K in normal range and within 180 days    Potassium  Date Value Ref Range Status  04/25/2024 4.2 3.5 - 5.2 mmol/L Final  01/22/2013 3.1 (L) 3.5 - 5.1 mmol/L Final         Passed - Na in normal range and within 180 days    Sodium  Date Value Ref Range Status  04/25/2024 142 134 - 144 mmol/L Final  01/22/2013 134 (L) 136 - 145 mmol/L Final         Passed - Last BP in normal range    BP Readings from Last 1 Encounters:  04/25/24 114/76         Passed - Valid encounter within last 6 months    Recent Outpatient Visits           1 month ago Type 2 diabetes mellitus with other specified complication, without long-term current use of insulin (HCC)   [place]  [facility] Mel[facility] Pa[facility], NP   2 months ago Annual physical exam   [place] [facility] Mel[facility] Pao, NP   8 months ago Hypertension associated with diabetes [facility])   [place] [facility] Mel[facility] Pao, NP   11 months ago Anxiety and depression   [place] [facility] [place], Pao, NP               metoprolol  succinate (TOPROL -XL) 25 MG 24 hr tablet [Pharmacy Med Name: Metoprolol  Succinate ER 25 MG Oral Tablet Extended Release 24 Hour] 100 tablet 2    Sig: TAKE 1 TABLET BY MOUTH DAILY     Cardiovascular:  Beta Blockers Passed - 05/28/2024  8:29 AM      Passed - Last BP in normal range    BP Readings from Last 1 Encounters:  04/25/24 114/76         Passed - Last Heart Rate in normal range    Pulse Readings from Last 1 Encounters:  04/25/24 (!) 59         Passed -  Valid encounter within last 6 months    Recent Outpatient Visits           1 month ago Type 2 diabetes mellitus with other specified complication, without long-term current use of insulin (HCC)   [place] [facility] Melvin Pa[facility], NP   2 months ago Annual physical exam   [place] [facility] Melvin Pao, NP   8 months ago Hypertension associated with diabetes [facility])   [place] [facility] Melvin Pao, NP   11 months ago Anxiety and depression   [place] [facility], Pao, NP               amLODipine  (NORVASC ) 10 MG tablet [Pharmacy Med Name: amLODIPine  Besylate 10 MG Oral Tablet] 100 tablet 2    Sig: TAKE 1 TABLET BY MOUTH DAILY     Cardiovascular: Calcium  Channel Blockers 2 Passed - 05/28/2024  8:29 AM      Passed - Last BP in normal range    BP Readings from Last 1 Encounters:  04/25/24 114/76         Passed - Last Heart Rate in normal range    Pulse Readings from Last 1 Encounters:  04/25/24 (!) 59         Passed - Valid  encounter within last 6 months    Recent Outpatient Visits           1 month ago Type 2 diabetes mellitus with other specified complication, without long-term current use of insulin (HCC)   [place] [facility] Melvin Pa[facility], NP   2 months ago Annual physical exam   [place] [facility] Mel[facility] Pao, NP   8 months ago Hypertension associated with diabetes [facility])   [place] [facility] Mel[facility] Pao, NP   11 months ago Anxiety and depression   [place] [facility] [place], Pao, NP               potassium chloride  SA (KLOR-CON  M) 20 MEQ tablet [Pharmacy Med Name: Potassium Chloride  Crys ER 20 MEQ Oral Tablet Extended Release] 200 tablet 2    Sig: TAKE 1 TABLET BY MOUTH TWICE  DAILY     Endocrinology:  Minerals - Potassium Supplementation Passed - 05/28/2024  8:29 AM      Passed - K in normal range and within 360 days    Potassium  Date Value Ref Range Status  04/25/2024 4.2 3.5 - 5.2 mmol/L Final  01/22/2013 3.1 (L) 3.5 - 5.1 mmol/L Final         Passed - Cr in normal range and within 360 days    Creatinine  Date Value Ref Range Status  01/22/2013 0.86 0.60 - 1.30 mg/dL Final   Creatinine, Ser  Date Value Ref Range Status  04/25/2024 0.80 0.57 - 1.00 mg/dL Final         Passed - Valid encounter within last 12 months    Recent Outpatient Visits           1 month ago Type 2 diabetes mellitus with other specified complication, without long-term current use of insulin (HCC)   [place] [facility] Mel[facility] Pao, NP   2 months ago Annual physical exam   [place] [facility] Melvin Pao, NP   8 months ago Hypertension associated with diabetes [facility])   [place] [facility] Melvin Pao, NP   11 months ago Anxiety and depression  [place] [facility] Melvin Pao, NP

## 2024-06-05 ENCOUNTER — Ambulatory Visit

## 2024-06-05 DIAGNOSIS — E119 Type 2 diabetes mellitus without complications: Secondary | ICD-10-CM

## 2024-06-05 NOTE — Telephone Encounter (Signed)
 Received pt proof of income and part of pt application missing pgs.

## 2024-06-05 NOTE — Progress Notes (Cosign Needed Addendum)
 "  06/05/2024 Name: Hannah Calhoun MRN: [id number] DOB: 09-06-1960  Chief Complaint  Patient presents with   Diabetes   Medication Assistance   Hannah Calhoun is a 64 y.o. year old female who presented for a telephone visit.   They were referred to the pharmacist by their PCP for assistance in managing medication access.   Subjective:  Care Team: Primary Care Provider: Melvin Pao, NP ; Next Scheduled Visit: 07/24/24  Medication Access/Adherence  [facility] Pharmacy:  [facility] [address]  - [address] [place] [address] Phone: [phone] Fax: [phone]  [facility] - [place] - [address] [place] [address] Phone: [phone] Fax: [phone]  [facility] [place] - [address] [place] [address] Phone: [phone] Fax: [phone]  Patient reports affordability concerns with their medications: Yes  Patient reports access/transportation concerns to their pharmacy: No  Patient reports adherence concerns with their medications:  Yes    Diabetes: Current medications: glipizide  5mg  BID -Medications tried in the past: metformin caused diarrhea and patient had pancreatitis while using Ozempic  -A1c recently found to be elevated at 8.8%, up from 6.9% in May -Jardiance  25mg  was prescribed, but patient has not been able to pick up and initiate therapy due to copay of >$200. Patient brought in paperwork for PAP.  -Current glucose readings: FBG has been averaging 180-208 recently -Patient reports Metformin causes significant diarrhea, but has not tried Metformin XR. Patient would be willing to try this if necessary in the future -Using [employer] brand testing supplies purchased OTC at [facility]  Macrovascular and Microvascular Risk Reduction:  Statin? no; patient previously intolerant to statin therapy; ACEi/ARB? yes  (valsartan  40mg  daily) Last urinary albumin/creatinine ratio:  Lab Results  Component Value Date   MICRALBCREAT <30 03/20/2024   MICRALBCREAT <30 09/13/2023   MICRALBCREAT <30 10/04/2022   MICRALBCREAT 30-300 (H) 07/05/2021   MICRALBCREAT <30 07/03/2020   Last eye exam:  Lab Results  Component Value Date   HMDIABEYEEXA  12/01/2022     Comment:     Unable to determine - Abstracted by HIM   Last foot exam: 03/20/2024 Tobacco Use:  Tobacco Use: Medium Risk (04/25/2024)   Patient History    Smoking Tobacco Use: Former    Smokeless Tobacco Use: Never    Passive Exposure: Not on file   Objective:  Lab Results  Component Value Date   HGBA1C 8.8 (H) 03/20/2024   Lab Results  Component Value Date   CREATININE 0.80 04/25/2024   BUN 8 04/25/2024   NA 142 04/25/2024   K 4.2 04/25/2024   CL 103 04/25/2024   CO2 25 04/25/2024   Lab Results  Component Value Date   CHOL 192 03/20/2024   HDL 51 03/20/2024   LDLCALC 111 (H) 03/20/2024   TRIG 171 (H) 03/20/2024   CHOLHDL 3.8 03/20/2024   Medications Reviewed Today   Medications were not reviewed in this encounter    Assessment/Plan:   Diabetes: -Currently uncontrolled; goal A1c <7%. Cardiorenal risk reduction is opportunities for improvement.. Blood pressure is at goal <130/80. LDL is not at goal.  -Continue glipizide  5mg  BID at this time -Will finalize [employer] PAP application for Jardiance  - expect result in 1 week and for medication to be delivered in 2 weeks if approved -Discussed side effect of profile of Jardiance  (increased urination). Advised to take in  the morning. -I recommend checking FBG daily and recording values -I only see where patient has tried IR metformin in the past, we could consider metformin XR 500mg  daily in the future if A1c goal not met with initiation of Jardiance  -Patient cannot tolerate statins, but I would recommend consideration of ezetimibe 10mg  daily or Repatha 140mg  every 2 weeks to try to  get LDL to goal of <70.  Patient would qualify for [employer] to cover copay for Repatha  Follow Up Plan: will contact patient with results of PAP  Hannah Calhoun, PharmD, DPLA    "

## 2024-06-14 NOTE — Addendum Note (Signed)
 Addended by: DEANNA ROSELLA A on: 06/14/2024 03:06 PM   Modules accepted: Level of Service

## 2024-07-23 ENCOUNTER — Ambulatory Visit

## 2024-07-24 ENCOUNTER — Ambulatory Visit: Admitting: Nurse Practitioner

## 2025-03-24 ENCOUNTER — Encounter: Admitting: Nurse Practitioner
